# Patient Record
Sex: Female | Born: 1960
Health system: Southern US, Community
[De-identification: ages and names within clinical notes are randomized; demographics above are authoritative.]

## PROBLEM LIST (undated history)

## (undated) DIAGNOSIS — I4891 Unspecified atrial fibrillation: Secondary | ICD-10-CM

## (undated) DIAGNOSIS — M2550 Pain in unspecified joint: Secondary | ICD-10-CM

## (undated) DIAGNOSIS — K219 Gastro-esophageal reflux disease without esophagitis: Secondary | ICD-10-CM

## (undated) DIAGNOSIS — R0600 Dyspnea, unspecified: Secondary | ICD-10-CM

## (undated) DIAGNOSIS — R600 Localized edema: Secondary | ICD-10-CM

## (undated) DIAGNOSIS — M25569 Pain in unspecified knee: Secondary | ICD-10-CM

## (undated) DIAGNOSIS — G4730 Sleep apnea, unspecified: Secondary | ICD-10-CM

## (undated) DIAGNOSIS — I10 Essential (primary) hypertension: Secondary | ICD-10-CM

## (undated) HISTORY — DX: Essential (primary) hypertension: I10

## (undated) HISTORY — DX: Pain in unspecified joint: M25.50

## (undated) HISTORY — DX: Sleep apnea, unspecified: G47.30

## (undated) HISTORY — DX: Gastro-esophageal reflux disease without esophagitis: K21.9

## (undated) HISTORY — DX: Pain in unspecified knee: M25.569

## (undated) HISTORY — DX: Unspecified atrial fibrillation: I48.91

## (undated) HISTORY — DX: Dyspnea, unspecified: R06.00

## (undated) HISTORY — DX: Localized edema: R60.0

---

## 1997-12-13 ENCOUNTER — Other Ambulatory Visit: Admission: RE | Admit: 1997-12-13 | Discharge: 1997-12-13 | Payer: Self-pay | Admitting: *Deleted

## 1999-01-22 ENCOUNTER — Other Ambulatory Visit: Admission: RE | Admit: 1999-01-22 | Discharge: 1999-01-22 | Payer: Self-pay | Admitting: *Deleted

## 1999-10-21 ENCOUNTER — Encounter: Admission: RE | Admit: 1999-10-21 | Discharge: 1999-10-21 | Payer: Self-pay | Admitting: *Deleted

## 1999-10-21 ENCOUNTER — Encounter: Payer: Self-pay | Admitting: *Deleted

## 2000-01-22 ENCOUNTER — Other Ambulatory Visit: Admission: RE | Admit: 2000-01-22 | Discharge: 2000-01-22 | Payer: Self-pay | Admitting: *Deleted

## 2003-03-27 ENCOUNTER — Ambulatory Visit (HOSPITAL_COMMUNITY): Admission: RE | Admit: 2003-03-27 | Discharge: 2003-03-27 | Payer: Self-pay | Admitting: Obstetrics and Gynecology

## 2004-03-31 ENCOUNTER — Ambulatory Visit (HOSPITAL_COMMUNITY): Admission: RE | Admit: 2004-03-31 | Discharge: 2004-03-31 | Payer: Self-pay | Admitting: Obstetrics and Gynecology

## 2005-05-08 ENCOUNTER — Ambulatory Visit (HOSPITAL_COMMUNITY): Admission: RE | Admit: 2005-05-08 | Discharge: 2005-05-08 | Payer: Self-pay | Admitting: Obstetrics and Gynecology

## 2006-07-07 ENCOUNTER — Ambulatory Visit (HOSPITAL_COMMUNITY): Admission: RE | Admit: 2006-07-07 | Discharge: 2006-07-07 | Payer: Self-pay | Admitting: Obstetrics and Gynecology

## 2007-07-22 ENCOUNTER — Ambulatory Visit (HOSPITAL_COMMUNITY): Admission: RE | Admit: 2007-07-22 | Discharge: 2007-07-22 | Payer: Self-pay | Admitting: Obstetrics and Gynecology

## 2007-09-09 ENCOUNTER — Telehealth: Payer: Self-pay | Admitting: Internal Medicine

## 2008-07-24 ENCOUNTER — Ambulatory Visit (HOSPITAL_COMMUNITY): Admission: RE | Admit: 2008-07-24 | Discharge: 2008-07-24 | Payer: Self-pay | Admitting: Obstetrics and Gynecology

## 2008-11-14 ENCOUNTER — Ambulatory Visit (HOSPITAL_COMMUNITY): Admission: RE | Admit: 2008-11-14 | Discharge: 2008-11-14 | Payer: Self-pay | Admitting: Family Medicine

## 2009-08-28 ENCOUNTER — Ambulatory Visit (HOSPITAL_COMMUNITY): Admission: RE | Admit: 2009-08-28 | Discharge: 2009-08-28 | Payer: Self-pay | Admitting: Obstetrics and Gynecology

## 2009-09-05 ENCOUNTER — Encounter: Admission: RE | Admit: 2009-09-05 | Discharge: 2009-09-05 | Payer: Self-pay | Admitting: Obstetrics and Gynecology

## 2010-02-16 ENCOUNTER — Encounter: Payer: Self-pay | Admitting: Obstetrics and Gynecology

## 2010-10-21 ENCOUNTER — Other Ambulatory Visit: Payer: Self-pay | Admitting: Nurse Practitioner

## 2010-10-21 DIAGNOSIS — Z1231 Encounter for screening mammogram for malignant neoplasm of breast: Secondary | ICD-10-CM

## 2010-10-30 ENCOUNTER — Ambulatory Visit
Admission: RE | Admit: 2010-10-30 | Discharge: 2010-10-30 | Disposition: A | Discharge status: Home / Self Care | Payer: BC Managed Care – PPO | Source: Ambulatory Visit | Attending: Nurse Practitioner | Admitting: Nurse Practitioner

## 2010-10-30 DIAGNOSIS — Z1231 Encounter for screening mammogram for malignant neoplasm of breast: Secondary | ICD-10-CM

## 2012-04-12 IMAGING — MG MM DIGITAL SCREENING {BCG}
6 series · 6 of 6 positions shown · non-contrast
Comparison: none

DG SCREEN MAMMOGRAM BILATERAL
Bilateral CC and MLO view(s) were taken.

DIGITAL SCREENING MAMMOGRAM WITH CAD:
The breast tissue is almost entirely fatty.  No masses or malignant type calcifications are 
identified.  Compared with prior studies.
Images were processed with CAD.

[R CC]
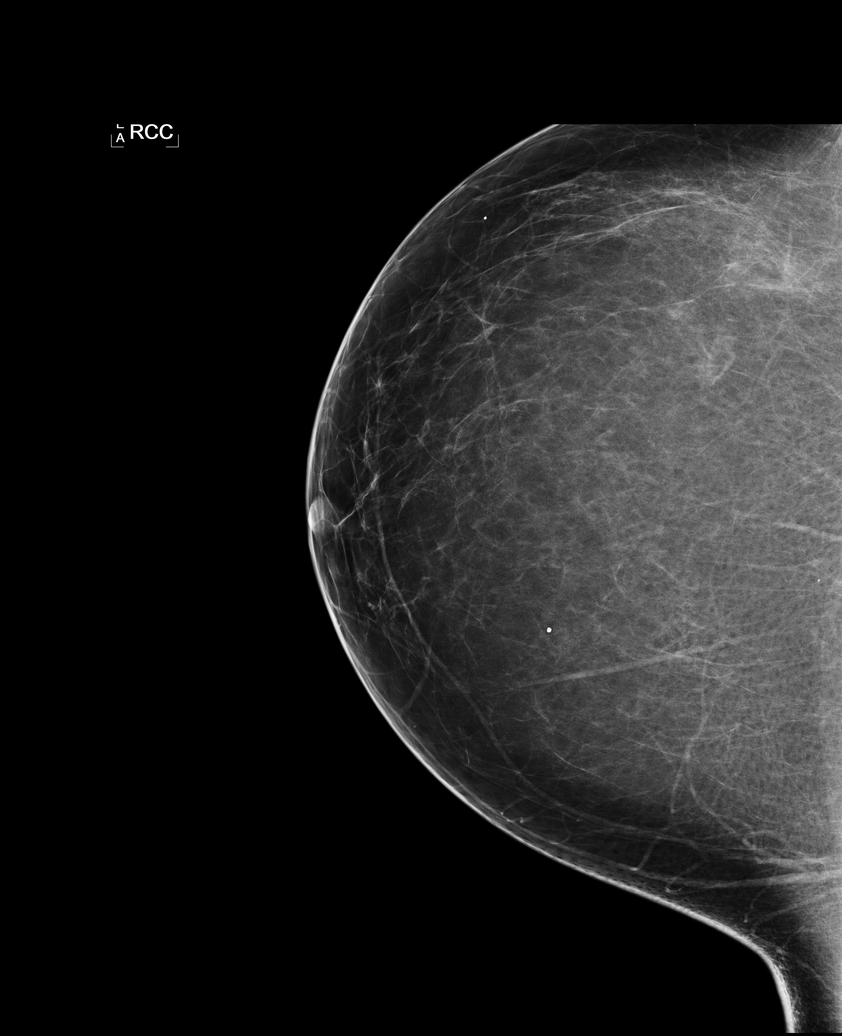

[L CC]
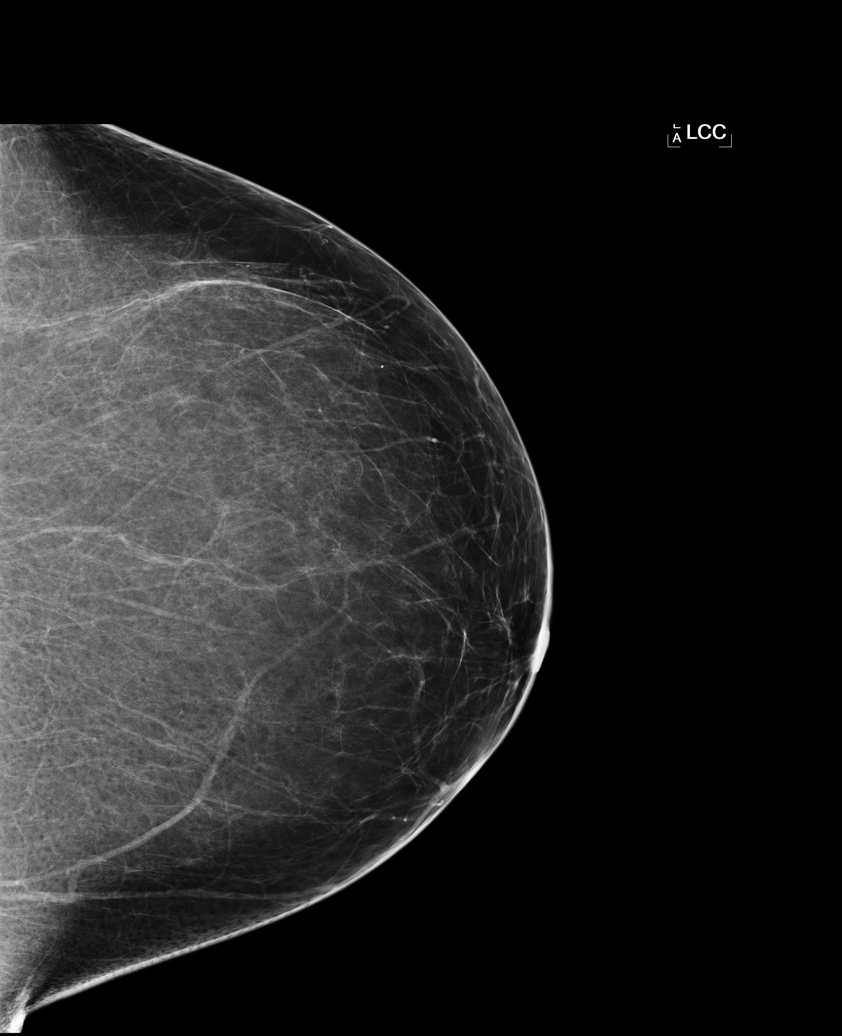

[L MLO (1 of 2)]
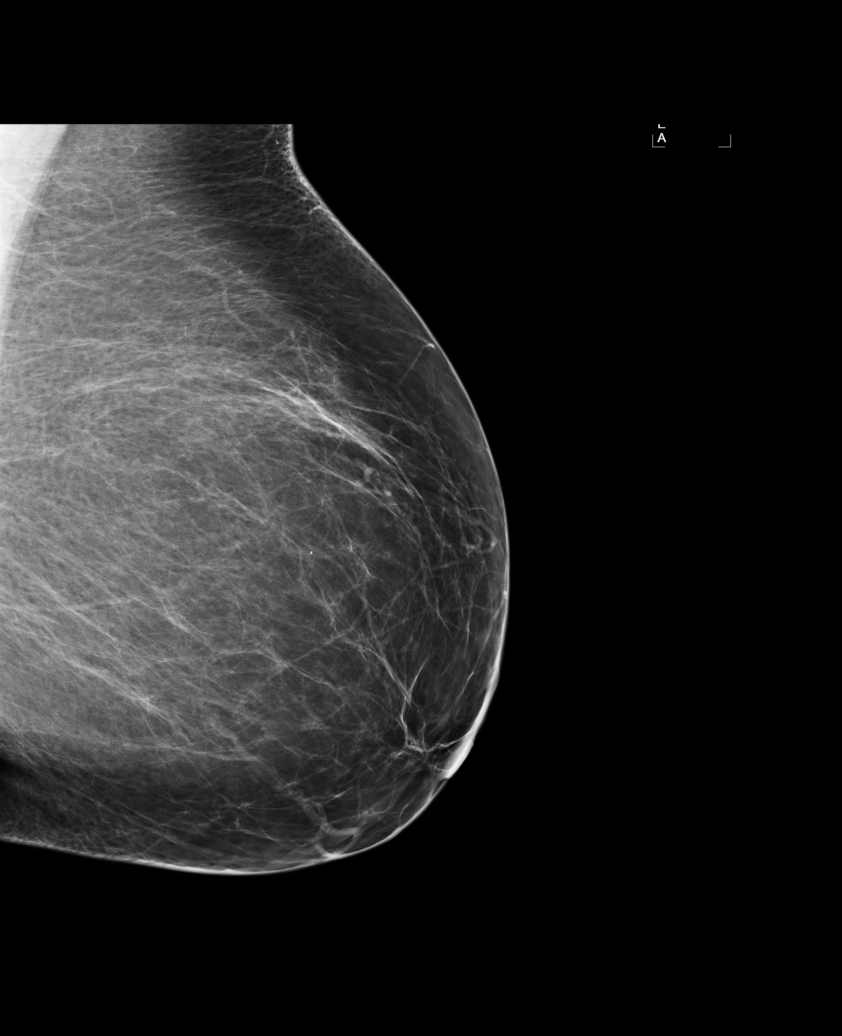

[R MLO (1 of 2)]
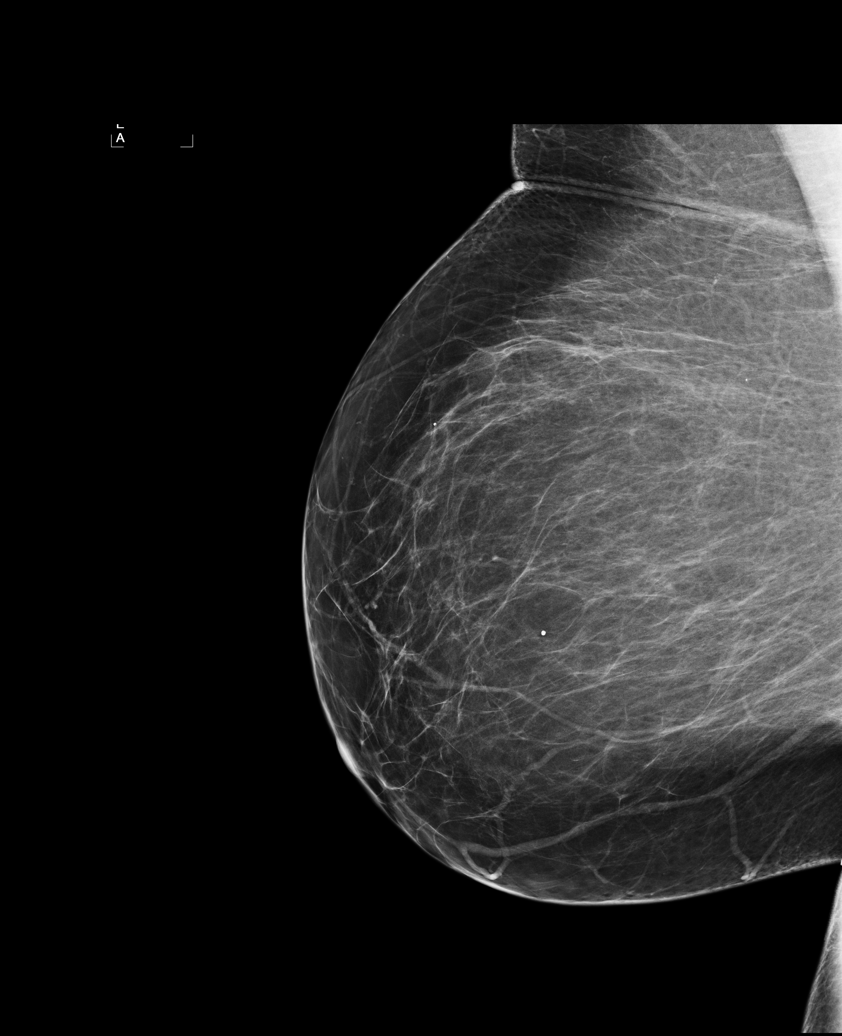

[L MLO (2 of 2)]
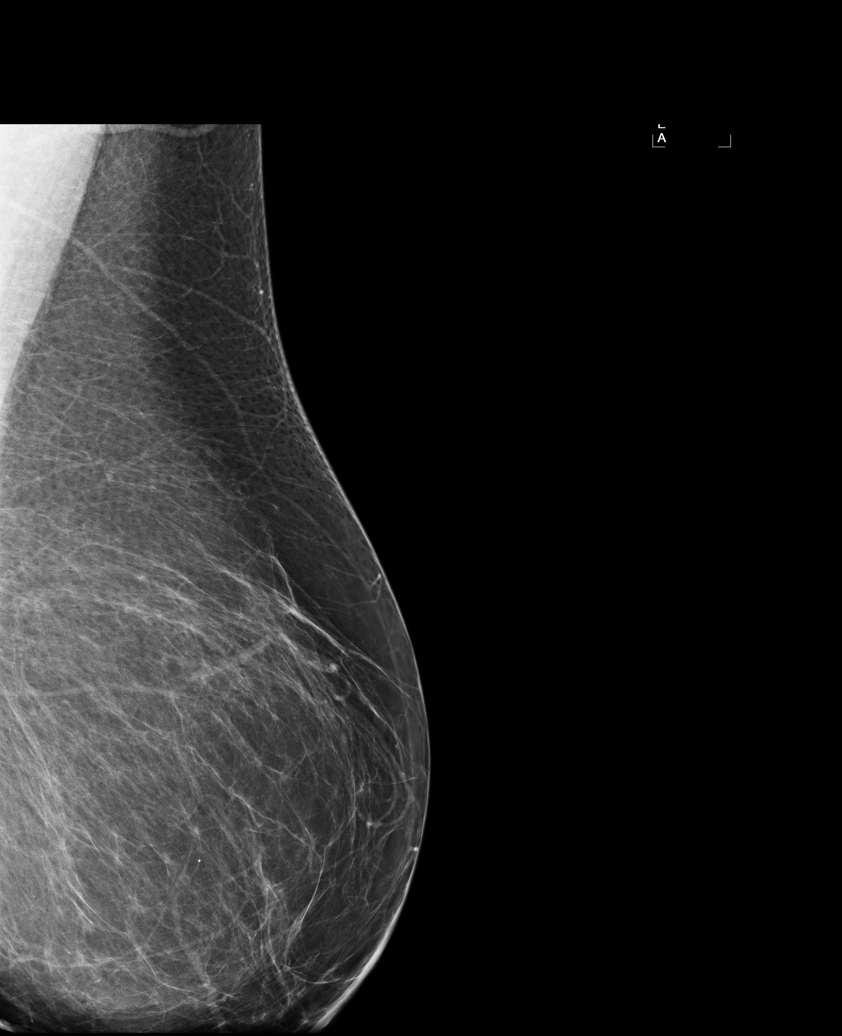

[R MLO (2 of 2)]
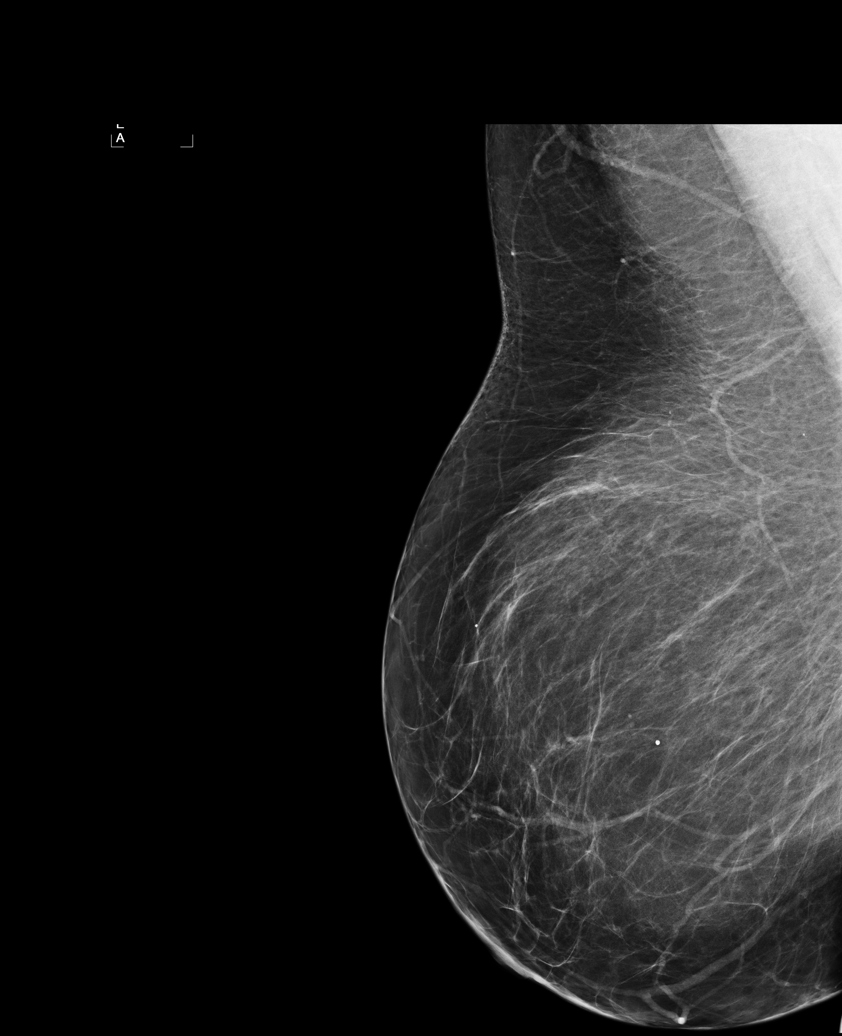

[6 of 6 positions shown; findings below may reference images not displayed]

IMPRESSION: No specific mammographic evidence of malignancy.  Next screening mammogram is recommended in one 
year.

A result letter of this screening mammogram will be mailed directly to the patient.

ASSESSMENT: Negative - BI-RADS 1

Screening mammogram in 1 year.
,

## 2012-08-29 ENCOUNTER — Other Ambulatory Visit: Payer: Self-pay | Admitting: Nurse Practitioner

## 2012-09-06 ENCOUNTER — Ambulatory Visit (INDEPENDENT_AMBULATORY_CARE_PROVIDER_SITE_OTHER): Payer: BC Managed Care – PPO | Admitting: Family Medicine

## 2012-09-06 ENCOUNTER — Encounter: Payer: Self-pay | Admitting: Family Medicine

## 2012-09-06 VITALS — BP 109/69 | HR 69 | Temp 98.4°F | Ht 68.0 in | Wt 261.0 lb

## 2012-09-06 DIAGNOSIS — I10 Essential (primary) hypertension: Secondary | ICD-10-CM

## 2012-09-06 MED ORDER — AMLODIPINE BESY-BENAZEPRIL HCL 5-40 MG PO CAPS: 1.0000 | ORAL_CAPSULE | Freq: Every day | ORAL | Status: DC

## 2012-09-06 MED ORDER — HYDROCHLOROTHIAZIDE 25 MG PO TABS: 25.0000 mg | ORAL_TABLET | Freq: Every day | ORAL | Status: DC

## 2012-09-06 NOTE — Progress Notes (Signed)
Subjective:    Patient here for follow-up of elevated blood pressure.  She is exercising and is not adherent to a low-salt diet.  Blood pressure is well controlled at home. Cardiac symptoms: none. Patient denies: chest pain, chest pressure/discomfort, dyspnea, fatigue and palpitations. Cardiovascular risk factors: obesity (BMI >= 30 kg/m2). Use of agents associated with hypertension: none. History of target organ damage: none.  The following portions of the patient's history were reviewed and updated as appropriate: allergies, current medications, past family history, past medical history, past social history, past surgical history and problem list.  Review of Systems  Pertinent items are noted in HPI.     Objective:    BP 109/69  Pulse 69  Temp(Src) 98.4 F (36.9 C) (Oral)  Ht 5\' 8"  (1.727 m)  Wt 261 lb (118.389 kg)  BMI 39.69 kg/m2  General Appearance:    Alert, cooperative, no distress, appears stated age, obese   Head:    Normocephalic, without obvious abnormality, atraumatic  Eyes:    PERRL, conjunctiva/corneas clear, EOM's intact, fundi    benign, both eyes  Ears:    Normal TM's and external ear canals, both ears  Nose:   Nares normal, septum midline, mucosa normal, no drainage    or sinus tenderness  Throat:   Lips, mucosa, and tongue normal; teeth and gums normal  Neck:   Supple, symmetrical, trachea midline, no adenopathy;    thyroid:  no enlargement/tenderness/nodules; no carotid   bruit or JVD  Back:     Symmetric, no curvature, ROM normal, no CVA tenderness  Lungs:     Clear to auscultation bilaterally, respirations unlabored  Chest Wall:    No tenderness or deformity   Heart:    Regular rate and rhythm, S1 and S2 normal, no murmur, rub   or gallop     Abdomen:     Soft, non-tender, bowel sounds active all four quadrants,    no masses, no organomegaly        Extremities:   Extremities normal, atraumatic, no cyanosis or edema  Pulses:   2+ and symmetric all  extremities  Skin:   Skin color, texture, turgor normal, no rashes or lesions  Lymph nodes:   Cervical, supraclavicular, and axillary nodes normal  Neurologic:   CNII-XII intact, normal strength, sensation and reflexes    throughout      Assessment:    Hypertension, normal blood pressure well controlled on current regimen. Evidence of target organ damage: none.    Plan:    Continue current regimen. Pt refused risk factor labs. Follow up in 6 months for BP recheck. Check Labs at that time.

## 2012-09-09 ENCOUNTER — Other Ambulatory Visit: Payer: Self-pay

## 2012-09-09 DIAGNOSIS — I10 Essential (primary) hypertension: Secondary | ICD-10-CM

## 2012-09-09 MED ORDER — HYDROCHLOROTHIAZIDE 25 MG PO TABS: 25.0000 mg | ORAL_TABLET | Freq: Every day | ORAL | Status: DC

## 2012-09-09 MED ORDER — AMLODIPINE BESY-BENAZEPRIL HCL 5-40 MG PO CAPS: 1.0000 | ORAL_CAPSULE | Freq: Every day | ORAL | Status: DC

## 2013-03-02 ENCOUNTER — Other Ambulatory Visit: Payer: Self-pay | Admitting: Family Medicine

## 2013-06-01 ENCOUNTER — Other Ambulatory Visit: Payer: Self-pay | Admitting: Family Medicine

## 2015-04-26 DIAGNOSIS — Z3042 Encounter for surveillance of injectable contraceptive: Secondary | ICD-10-CM | POA: Diagnosis not present

## 2015-07-16 DIAGNOSIS — Z3042 Encounter for surveillance of injectable contraceptive: Secondary | ICD-10-CM | POA: Diagnosis not present

## 2015-10-08 DIAGNOSIS — Z3042 Encounter for surveillance of injectable contraceptive: Secondary | ICD-10-CM | POA: Diagnosis not present

## 2015-10-08 DIAGNOSIS — Z113 Encounter for screening for infections with a predominantly sexual mode of transmission: Secondary | ICD-10-CM | POA: Diagnosis not present

## 2015-10-08 DIAGNOSIS — Z114 Encounter for screening for human immunodeficiency virus [HIV]: Secondary | ICD-10-CM | POA: Diagnosis not present

## 2015-10-08 DIAGNOSIS — Z1159 Encounter for screening for other viral diseases: Secondary | ICD-10-CM | POA: Diagnosis not present

## 2015-10-11 ENCOUNTER — Telehealth: Payer: Self-pay | Admitting: Family Medicine

## 2016-01-28 DIAGNOSIS — T161XXA Foreign body in right ear, initial encounter: Secondary | ICD-10-CM | POA: Diagnosis not present

## 2016-01-28 DIAGNOSIS — H93291 Other abnormal auditory perceptions, right ear: Secondary | ICD-10-CM | POA: Diagnosis not present

## 2016-02-07 DIAGNOSIS — Z6841 Body Mass Index (BMI) 40.0 and over, adult: Secondary | ICD-10-CM | POA: Diagnosis not present

## 2016-02-07 DIAGNOSIS — Z01419 Encounter for gynecological examination (general) (routine) without abnormal findings: Secondary | ICD-10-CM | POA: Diagnosis not present

## 2016-02-07 DIAGNOSIS — Z1231 Encounter for screening mammogram for malignant neoplasm of breast: Secondary | ICD-10-CM | POA: Diagnosis not present

## 2016-02-28 MED FILL — PHENTERMINE 37.5 MG TABLET: 37.5 | 30 days supply | Qty: 30 | Fill #0

## 2016-02-28 MED FILL — AMLODIPINE-BENAZEPRIL 5-40: 5-40 | 30 days supply | Qty: 30 | Fill #0

## 2016-03-03 ENCOUNTER — Ambulatory Visit: Payer: Self-pay | Admitting: Adult Health

## 2016-03-25 ENCOUNTER — Ambulatory Visit: Payer: Self-pay | Admitting: Adult Health

## 2016-04-09 ENCOUNTER — Ambulatory Visit: Payer: Self-pay | Admitting: Adult Health

## 2016-04-17 MED FILL — PHENTERMINE 37.5 MG TABLET: 37.5 | 30 days supply | Qty: 30 | Fill #1

## 2016-04-17 MED FILL — AMLODIPINE-BENAZEPRIL 5-40: 5-40 | 30 days supply | Qty: 30 | Fill #1

## 2016-04-30 ENCOUNTER — Encounter: Payer: Self-pay | Admitting: Adult Health

## 2016-04-30 ENCOUNTER — Ambulatory Visit (INDEPENDENT_AMBULATORY_CARE_PROVIDER_SITE_OTHER): Payer: 59 | Admitting: Adult Health

## 2016-04-30 VITALS — BP 147/83 | HR 76 | Ht 68.0 in | Wt 265.9 lb

## 2016-04-30 DIAGNOSIS — Z13220 Encounter for screening for lipoid disorders: Secondary | ICD-10-CM | POA: Diagnosis not present

## 2016-04-30 DIAGNOSIS — E669 Obesity, unspecified: Secondary | ICD-10-CM

## 2016-04-30 DIAGNOSIS — M79602 Pain in left arm: Secondary | ICD-10-CM | POA: Diagnosis not present

## 2016-04-30 DIAGNOSIS — Z833 Family history of diabetes mellitus: Secondary | ICD-10-CM | POA: Diagnosis not present

## 2016-04-30 DIAGNOSIS — I10 Essential (primary) hypertension: Secondary | ICD-10-CM | POA: Insufficient documentation

## 2016-04-30 DIAGNOSIS — R5383 Other fatigue: Secondary | ICD-10-CM | POA: Diagnosis not present

## 2016-04-30 DIAGNOSIS — Z1211 Encounter for screening for malignant neoplasm of colon: Secondary | ICD-10-CM | POA: Insufficient documentation

## 2016-04-30 DIAGNOSIS — Z0000 Encounter for general adult medical examination without abnormal findings: Secondary | ICD-10-CM | POA: Insufficient documentation

## 2016-04-30 DIAGNOSIS — Z6841 Body Mass Index (BMI) 40.0 and over, adult: Secondary | ICD-10-CM | POA: Insufficient documentation

## 2016-04-30 DIAGNOSIS — M79601 Pain in right arm: Secondary | ICD-10-CM | POA: Diagnosis not present

## 2016-04-30 NOTE — Patient Instructions (Signed)
Hypertension Hypertension, commonly called high blood pressure, is when the force of blood pumping through the arteries is too strong. The arteries are the blood vessels that carry blood from the heart throughout the body. Hypertension forces the heart to work harder to pump blood and may cause arteries to become narrow or stiff. Having untreated or uncontrolled hypertension can cause heart attacks, strokes, kidney disease, and other problems. A blood pressure reading consists of a higher number over a lower number. Ideally, your blood pressure should be below 120/80. The first ("top") number is called the systolic pressure. It is a measure of the pressure in your arteries as your heart beats. The second ("bottom") number is called the diastolic pressure. It is a measure of the pressure in your arteries as the heart relaxes. What are the causes? The cause of this condition is not known. What increases the risk? Some risk factors for high blood pressure are under your control. Others are not. Factors you can change   Smoking.  Having type 2 diabetes mellitus, high cholesterol, or both.  Not getting enough exercise or physical activity.  Being overweight.  Having too much fat, sugar, calories, or salt (sodium) in your diet.  Drinking too much alcohol. Factors that are difficult or impossible to change   Having chronic kidney disease.  Having a family history of high blood pressure.  Age. Risk increases with age.  Race. You may be at higher risk if you are African-American.  Gender. Men are at higher risk than women before age 45. After age 65, women are at higher risk than men.  Having obstructive sleep apnea.  Stress. What are the signs or symptoms? Extremely high blood pressure (hypertensive crisis) may cause:  Headache.  Anxiety.  Shortness of breath.  Nosebleed.  Nausea and vomiting.  Severe chest pain.  Jerky movements you cannot control (seizures). How is this  diagnosed? This condition is diagnosed by measuring your blood pressure while you are seated, with your arm resting on a surface. The cuff of the blood pressure monitor will be placed directly against the skin of your upper arm at the level of your heart. It should be measured at least twice using the same arm. Certain conditions can cause a difference in blood pressure between your right and left arms. Certain factors can cause blood pressure readings to be lower or higher than normal (elevated) for a short period of time:  When your blood pressure is higher when you are in a health care provider's office than when you are at home, this is called white coat hypertension. Most people with this condition do not need medicines.  When your blood pressure is higher at home than when you are in a health care provider's office, this is called masked hypertension. Most people with this condition may need medicines to control blood pressure. If you have a high blood pressure reading during one visit or you have normal blood pressure with other risk factors:  You may be asked to return on a different day to have your blood pressure checked again.  You may be asked to monitor your blood pressure at home for 1 week or longer. If you are diagnosed with hypertension, you may have other blood or imaging tests to help your health care provider understand your overall risk for other conditions. How is this treated? This condition is treated by making healthy lifestyle changes, such as eating healthy foods, exercising more, and reducing your alcohol intake. Your health   care provider may prescribe medicine if lifestyle changes are not enough to get your blood pressure under control, and if:  Your systolic blood pressure is above 130.  Your diastolic blood pressure is above 80. Your personal target blood pressure may vary depending on your medical conditions, your age, and other factors. Follow these instructions  at home: Eating and drinking   Eat a diet that is high in fiber and potassium, and low in sodium, added sugar, and fat. An example eating plan is called the DASH (Dietary Approaches to Stop Hypertension) diet. To eat this way:  Eat plenty of fresh fruits and vegetables. Try to fill half of your plate at each meal with fruits and vegetables.  Eat whole grains, such as whole wheat pasta, brown rice, or whole grain bread. Fill about one quarter of your plate with whole grains.  Eat or drink low-fat dairy products, such as skim milk or low-fat yogurt.  Avoid fatty cuts of meat, processed or cured meats, and poultry with skin. Fill about one quarter of your plate with lean proteins, such as fish, chicken without skin, beans, eggs, and tofu.  Avoid premade and processed foods. These tend to be higher in sodium, added sugar, and fat.  Reduce your daily sodium intake. Most people with hypertension should eat less than 1,500 mg of sodium a day.  Limit alcohol intake to no more than 1 drink a day for nonpregnant women and 2 drinks a day for men. One drink equals 12 oz of beer, 5 oz of wine, or 1 oz of hard liquor. Lifestyle   Work with your health care provider to maintain a healthy body weight or to lose weight. Ask what an ideal weight is for you.  Get at least 30 minutes of exercise that causes your heart to beat faster (aerobic exercise) most days of the week. Activities may include walking, swimming, or biking.  Include exercise to strengthen your muscles (resistance exercise), such as pilates or lifting weights, as part of your weekly exercise routine. Try to do these types of exercises for 30 minutes at least 3 days a week.  Do not use any products that contain nicotine or tobacco, such as cigarettes and e-cigarettes. If you need help quitting, ask your health care provider.  Monitor your blood pressure at home as told by your health care provider.  Keep all follow-up visits as told by  your health care provider. This is important. Medicines   Take over-the-counter and prescription medicines only as told by your health care provider. Follow directions carefully. Blood pressure medicines must be taken as prescribed.  Do not skip doses of blood pressure medicine. Doing this puts you at risk for problems and can make the medicine less effective.  Ask your health care provider about side effects or reactions to medicines that you should watch for. Contact a health care provider if:  You think you are having a reaction to a medicine you are taking.  You have headaches that keep coming back (recurring).  You feel dizzy.  You have swelling in your ankles.  You have trouble with your vision. Get help right away if:  You develop a severe headache or confusion.  You have unusual weakness or numbness.  You feel faint.  You have severe pain in your chest or abdomen.  You vomit repeatedly.  You have trouble breathing. Summary  Hypertension is when the force of blood pumping through your arteries is too strong. If this condition is   not controlled, it may put you at risk for serious complications.  Your personal target blood pressure may vary depending on your medical conditions, your age, and other factors. For most people, a normal blood pressure is less than 120/80.  Hypertension is treated with lifestyle changes, medicines, or a combination of both. Lifestyle changes include weight loss, eating a healthy, low-sodium diet, exercising more, and limiting alcohol. This information is not intended to replace advice given to you by your health care provider. Make sure you discuss any questions you have with your health care provider. Document Released: 01/12/2005 Document Revised: 12/11/2015 Document Reviewed: 12/11/2015 Elsevier Interactive Patient Education  2017 [place]. Heart-Healthy Eating Plan Many factors influence your heart health, including eating and  exercise habits. Heart (coronary) risk increases with abnormal blood fat (lipid) levels. Heart-healthy meal planning includes limiting unhealthy fats, increasing healthy fats, and making other small dietary changes. This includes maintaining a healthy body weight to help keep lipid levels within a normal range. What is my plan? Your health care provider recommends that you:  Get no more than _________% of the total calories in your daily diet from fat.  Limit your intake of saturated fat to less than _________% of your total calories each day.  Limit the amount of cholesterol in your diet to less than _________ mg per day. What types of fat should I choose?  Choose healthy fats more often. Choose monounsaturated and polyunsaturated fats, such as olive oil and canola oil, flaxseeds, walnuts, almonds, and seeds.  Eat more omega-3 fats. Good choices include salmon, mackerel, sardines, tuna, flaxseed oil, and ground flaxseeds. Aim to eat fish at least two times each week.  Limit saturated fats. Saturated fats are primarily found in animal products, such as meats, butter, and cream. Plant sources of saturated fats include palm oil, palm kernel oil, and coconut oil.  Avoid foods with partially hydrogenated oils in them. These contain trans fats. Examples of foods that contain trans fats are stick margarine, some tub margarines, cookies, crackers, and other baked goods. What general guidelines do I need to follow?  Check food labels carefully to identify foods with trans fats or high amounts of saturated fat.  Fill one half of your plate with vegetables and green salads. Eat 4-5 servings of vegetables per day. A serving of vegetables equals 1 cup of raw leafy vegetables,  cup of raw or cooked cut-up vegetables, or  cup of vegetable juice.  Fill one fourth of your plate with whole grains. Look for the word "whole" as the first word in the ingredient list.  Fill one fourth of your plate with lean  protein foods.  Eat 4-5 servings of fruit per day. A serving of fruit equals one medium whole fruit,  cup of dried fruit,  cup of fresh, frozen, or canned fruit, or  cup of 100% fruit juice.  Eat more foods that contain soluble fiber. Examples of foods that contain this type of fiber are apples, broccoli, carrots, beans, peas, and barley. Aim to get 20-30 g of fiber per day.  Eat more home-cooked food and less restaurant, buffet, and fast food.  Limit or avoid alcohol.  Limit foods that are high in starch and sugar.  Avoid fried foods.  Cook foods by using methods other than frying. Baking, boiling, grilling, and broiling are all great options. Other fat-reducing suggestions include:  Removing the skin from poultry.  Removing all visible fats from meats.  Skimming the fat off of stews,  soups, and gravies before serving them.  Steaming vegetables in water or broth.  Lose weight if you are overweight. Losing just 5-10% of your initial body weight can help your overall health and prevent diseases such as diabetes and heart disease.  Increase your consumption of nuts, legumes, and seeds to 4-5 servings per week. One serving of dried beans or legumes equals  cup after being cooked, one serving of nuts equals 1 ounces, and one serving of seeds equals  ounce or 1 tablespoon.  You may need to monitor your salt (sodium) intake, especially if you have high blood pressure. Talk with your health care provider or dietitian to get more information about reducing sodium. What foods can I eat? Grains   Breads, including [place], white, pita, wheat, raisin, rye, oatmeal, and [place]. Tortillas that are neither fried nor made with lard or trans fat. Low-fat rolls, including hotdog and hamburger buns and English muffins. Biscuits. Muffins. Waffles. Pancakes. Light popcorn. Whole-grain cereals. Flatbread. Melba toast. Pretzels. Breadsticks. Rusks. Low-fat snacks and crackers, including oyster,  saltine, matzo, graham, animal, and rye. Rice and pasta, including brown rice and those that are made with whole wheat. Vegetables  All vegetables. Fruits  All fruits, but limit coconut. Meats and Other Protein Sources  Lean, well-trimmed beef, veal, pork, and lamb. Chicken and [place] without skin. All fish and shellfish. Wild duck, rabbit, pheasant, and venison. Egg whites or low-cholesterol egg substitutes. Dried beans, peas, lentils, and tofu.Seeds and most nuts. Dairy  Low-fat or nonfat cheeses, including ricotta, string, and mozzarella. Skim or 1% milk that is liquid, powdered, or evaporated. Buttermilk that is made with low-fat milk. Nonfat or low-fat yogurt. Beverages  Mineral water. Diet carbonated beverages. Sweets and Desserts  Sherbets and fruit ices. Honey, jam, marmalade, jelly, and syrups. Meringues and gelatins. Pure sugar candy, such as hard candy, jelly beans, gumdrops, mints, marshmallows, and small amounts of dark chocolate. [employer] Eat all sweets and desserts in moderation. Fats and Oils  Nonhydrogenated (trans-free) margarines. Vegetable oils, including soybean, sesame, sunflower, olive, peanut, safflower, corn, canola, and cottonseed. Salad dressings or mayonnaise that are made with a vegetable oil. Limit added fats and oils that you use for cooking, baking, salads, and as spreads. Other  Cocoa powder. Coffee and tea. All seasonings and condiments. The items listed above may not be a complete list of recommended foods or beverages. Contact your dietitian for more options.  What foods are not recommended? Grains  Breads that are made with saturated or trans fats, oils, or whole milk. Croissants. Butter rolls. Cheese breads. Sweet rolls. Donuts. Buttered popcorn. Chow mein noodles. High-fat crackers, such as cheese or butter crackers. Meats and Other Protein Sources  Fatty meats, such as hotdogs, short ribs, sausage, spareribs, bacon, ribeye roast or steak, and  mutton. High-fat deli meats, such as salami and bologna. Caviar. Domestic duck and goose. Organ meats, such as kidney, liver, sweetbreads, brains, gizzard, chitterlings, and heart. Dairy  Cream, sour cream, cream cheese, and creamed cottage cheese. Whole milk cheeses, including blue (bleu), Monte[place] Jack, [place], [place], American, [place]met, Swiss, [place], [place], and [place]. Whole or 2% milk that is liquid, evaporated, or condensed. Whole buttermilk. Cream sauce or high-fat cheese sauce. Yogurt that is made from whole milk. Beverages  Regular sodas and drinks with added sugar. Sweets and Desserts  Frosting. Pudding. Cookies. Cakes other than angel food cake. Candy that has milk chocolate or white chocolate, hydrogenated fat, butter, coconut, or unknown ingredients. Buttered syrups. Full-fat  ice cream or ice cream drinks. Fats and Oils  Gravy that has suet, meat fat, or shortening. Cocoa butter, hydrogenated oils, palm oil, coconut oil, palm kernel oil. These can often be found in baked products, candy, fried foods, nondairy creamers, and whipped toppings. Solid fats and shortenings, including bacon fat, salt pork, lard, and butter. Nondairy cream substitutes, such as coffee creamers and sour cream substitutes. Salad dressings that are made of unknown oils, cheese, or sour cream. The items listed above may not be a complete list of foods and beverages to avoid. Contact your dietitian for more information.  This information is not intended to replace advice given to you by your health care provider. Make sure you discuss any questions you have with your health care provider. Document Released: 10/22/2007 Document Revised: 08/02/2015 Document Reviewed: 07/06/2013 Elsevier Interactive Patient Education  2017 [employer].  Please continue all medications as directed. Increase water intake to at least 120 ounces/daily. Continue to increase daily movement, recommend at least 63mins/5 times week. Please  schedule fasting lab nurse visit at your convenience. Annual follow-up, sooner if needed.

## 2016-04-30 NOTE — Assessment & Plan Note (Signed)
  Please continue all medications as directed. Increase water intake to at least 120 ounces/daily. Continue to increase daily movement, recommend at least 9mins/5 times week. Please schedule fasting lab nurse visit at your convenience. Annual follow-up, sooner if needed.

## 2016-04-30 NOTE — Assessment & Plan Note (Signed)
Stretch daily-YouTube, Pinterest videos. [place] Salt baths. OTC Ibuprofen PRN.

## 2016-04-30 NOTE — Progress Notes (Signed)
Subjective:    Patient ID: Tonya Nicholson, female    DOB: [birth year], 56 y.o.   MRN: [id number]  HPI:  Tonya Nicholson is here to establish as a new pt.  She is a very pleasant 56 year old woman.  PMH:  HTN, obesity, and bil upper arm pain that develops after strenuous yard work/exercise, rated at 4/10 when it hurts and describes pain as throbbing/soreness.  She is compliant with medications and denies SE.  She has been taking phentermine sporadically over the last few months and reports gaining back the 10 lbs she had lost.  Her and her daughter are planning on starting a new, regular exercise routine (nightly walking, using local [employer] 2-3 times week). Her diet is well balanced and she has been eliminating sugar/saturated fat/CHOs.  She denies tobacco use and reports social consumption of ETOH.    Patient Care Team    Relationship Specialty Notifications Start End  Odella Aquas, NP PCP - General Family Medicine  04/30/16     Patient Active Problem List   Diagnosis Date Noted  . Health care maintenance 04/30/2016  . Family history of diabetes mellitus 04/30/2016  . Screening for hyperlipidemia 04/30/2016  . Other fatigue 04/30/2016     Past Medical History:  Diagnosis Date  . Hypertension      History reviewed. No pertinent surgical history.   Family History  Problem Relation Age of Onset  . Diabetes Mother   . Hypertension Mother   . Diabetes Father   . Cancer Father     bladders  . Hypertension Father   . Healthy Sister   . Diabetes Brother      History  Drug Use No     History  Alcohol Use  . 5.4 oz/week  . 7 Standard drinks or equivalent, 2 Shots of liquor per week     History  Smoking Status  . Never Smoker  Smokeless Tobacco  . Never Used     Outpatient Encounter Prescriptions as of 04/30/2016  Medication Sig  . amLODipine-benazepril (LOTREL) 5-40 MG per capsule TAKE 1 CAPSULE BY MOUTH DAILY.  . hydrochlorothiazide (HYDRODIURIL) 25 MG tablet Take 1  tablet (25 mg total) by mouth daily.  . phentermine 37.5 MG capsule Take 1 capsule by mouth daily.  . valACYclovir (VALTREX) 500 MG tablet Take 1 tablet by mouth daily.   No facility-administered encounter medications on file as of 04/30/2016.     Allergies: Patient has no known allergies.  Body mass index is 40.43 kg/m.  Blood pressure (!) 147/83, pulse 76, height 5\' 8"  (1.727 m), weight 265 lb 14.4 oz (120.6 kg), last menstrual period 04/09/2016.  Review of Systems  Constitutional: Negative for activity change, appetite change, chills, diaphoresis, fatigue, fever and unexpected weight change.  HENT: Negative for congestion.   Eyes: Negative for visual disturbance.  Respiratory: Negative for cough, chest tightness, shortness of breath, wheezing and stridor.   Cardiovascular: Positive for leg swelling. Negative for chest pain and palpitations.  Gastrointestinal: Negative for abdominal distention, abdominal pain, blood in stool, constipation, diarrhea, nausea and vomiting.  Endocrine: Negative for cold intolerance, heat intolerance, polydipsia, polyphagia and polyuria.  Genitourinary: Negative for difficulty urinating and flank pain.  Musculoskeletal: Positive for arthralgias and myalgias. Negative for back pain, gait problem, joint swelling, neck pain and neck stiffness.  Skin: Negative for color change, pallor, rash and wound.       Objective:   Physical Exam  Constitutional: She is oriented to  person, place, and time. She appears well-developed and well-nourished. No distress.  HENT:  Head: Normocephalic and atraumatic.  Right Ear: Hearing, tympanic membrane, external ear and ear canal normal. No decreased hearing is noted.  Left Ear: Hearing, tympanic membrane, external ear and ear canal normal. No decreased hearing is noted.  Nose: Rhinorrhea present. No sinus tenderness. Right sinus exhibits no maxillary sinus tenderness and no frontal sinus tenderness. Left sinus exhibits no  maxillary sinus tenderness and no frontal sinus tenderness.  Mouth/Throat: Uvula is midline.  Eyes: Conjunctivae are normal. Pupils are equal, round, and reactive to light.  Neck: Normal range of motion. Neck supple.  Cardiovascular: Normal rate, regular rhythm, normal heart sounds and intact distal pulses.   No murmur heard. Pulmonary/Chest: Effort normal and breath sounds normal. No respiratory distress. She has no wheezes. She has no rales. She exhibits no tenderness.  Abdominal: Soft. Bowel sounds are normal. She exhibits no distension and no mass. There is no tenderness. There is no rebound and no guarding.  Musculoskeletal: Normal range of motion. She exhibits no edema, tenderness or deformity.       Right shoulder: She exhibits normal range of motion, no tenderness, no swelling, no crepitus, normal pulse and normal strength.       Left shoulder: She exhibits crepitus. She exhibits normal range of motion, no tenderness, no swelling, normal pulse and normal strength.  Lymphadenopathy:    She has no cervical adenopathy.  Neurological: She is alert and oriented to person, place, and time. Coordination normal.  Skin: Skin is warm and dry. No rash noted. She is not diaphoretic. No erythema. No pallor.  Psychiatric: She has a normal mood and affect. Her behavior is normal. Judgment and thought content normal.  Nursing note and vitals reviewed.         Assessment & Plan:   1. Other fatigue   2. Family history of diabetes mellitus   3. Screening for hyperlipidemia   4. Health care maintenance   5. Essential hypertension   6. Obesity due to energy imbalance   7. Bilateral arm pain     Health care maintenance  Please continue all medications as directed. Increase water intake to at least 120 ounces/daily. Continue to increase daily movement, recommend at least 55mins/5 times week. Please schedule fasting lab nurse visit at your convenience. Annual follow-up, sooner if  needed.  Family history of diabetes mellitus Will check A1c next week.  Screening for hyperlipidemia Will check lipid panel next week.  Other fatigue Will check Vit d level next week.  Essential hypertension Continue amlodipine/benazepril 5/40 daily. Continue HCTZ 25 mg daily.   Obesity due to energy imbalance Continue to eat a heart healthy diet. Increase regular movement to at least 50mins/5 times week. Resume use of [employer]. Continue yard work. Goal clothing size is 14/16.  Bilateral arm pain Stretch daily-YouTube, Pinterest videos. Epson Salt baths. OTC Ibuprofen PRN.    FOLLOW-UP:  Return in about 1 year (around 04/30/2017) for Regular Follow Up, HTN.

## 2016-04-30 NOTE — Assessment & Plan Note (Signed)
Will check A1c next week.

## 2016-04-30 NOTE — Assessment & Plan Note (Signed)
Continue amlodipine/benazepril 5/40 daily. Continue HCTZ 25 mg daily.

## 2016-04-30 NOTE — Assessment & Plan Note (Signed)
Will check lipid panel next week.

## 2016-04-30 NOTE — Assessment & Plan Note (Signed)
Continue to eat a heart healthy diet. Increase regular movement to at least 53mins/5 times week. Resume use of [employer]. Continue yard work. Goal clothing size is 14/16.

## 2016-04-30 NOTE — Assessment & Plan Note (Signed)
Will check Vit d level next week.

## 2016-05-08 ENCOUNTER — Other Ambulatory Visit (INDEPENDENT_AMBULATORY_CARE_PROVIDER_SITE_OTHER): Payer: 59

## 2016-05-08 DIAGNOSIS — Z13220 Encounter for screening for lipoid disorders: Secondary | ICD-10-CM | POA: Diagnosis not present

## 2016-05-08 DIAGNOSIS — R5383 Other fatigue: Secondary | ICD-10-CM

## 2016-05-08 DIAGNOSIS — Z833 Family history of diabetes mellitus: Secondary | ICD-10-CM | POA: Diagnosis not present

## 2016-05-09 LAB — LIPID PANEL
CHOL/HDL RATIO: 2.8 ratio (ref 0.0–4.4)
CHOLESTEROL TOTAL: 144 mg/dL (ref 100–199)
HDL: 51 mg/dL (ref >39)
LDL CALC: 77 mg/dL (ref 0–99)
TRIGLYCERIDES: 78 mg/dL (ref 0–149)
VLDL CHOLESTEROL CAL: 16 mg/dL (ref 5–40)

## 2016-05-09 LAB — CBC WITH DIFFERENTIAL/PLATELET
BASOS: 1 %
Basophils Absolute: 0.1 10*3/uL (ref 0.0–0.2)
EOS (ABSOLUTE): 0.2 10*3/uL (ref 0.0–0.4)
EOS: 2 %
HEMATOCRIT: 43.3 % (ref 34.0–46.6)
HEMOGLOBIN: 14.2 g/dL (ref 11.1–15.9)
IMMATURE GRANS (ABS): 0.1 10*3/uL (ref 0.0–0.1)
IMMATURE GRANULOCYTES: 1 %
LYMPHS: 23 %
Lymphocytes Absolute: 1.7 10*3/uL (ref 0.7–3.1)
MCH: 29 pg (ref 26.6–33.0)
MCHC: 32.8 g/dL (ref 31.5–35.7)
MCV: 88 fL (ref 79–97)
MONOCYTES: 8 %
Monocytes Absolute: 0.6 10*3/uL (ref 0.1–0.9)
NEUTROS ABS: 4.8 10*3/uL (ref 1.4–7.0)
NEUTROS PCT: 65 %
PLATELETS: 273 10*3/uL (ref 150–379)
RBC: 4.9 x10E6/uL (ref 3.77–5.28)
RDW: 13.2 % (ref 12.3–15.4)
WBC: 7.5 10*3/uL (ref 3.4–10.8)

## 2016-05-09 LAB — COMPREHENSIVE METABOLIC PANEL
A/G RATIO: 1.6 (ref 1.2–2.2)
ALBUMIN: 3.9 g/dL (ref 3.5–5.5)
ALT: 20 IU/L (ref 0–32)
AST: 16 IU/L (ref 0–40)
Alkaline Phosphatase: 102 IU/L (ref 39–117)
BILIRUBIN TOTAL: 0.5 mg/dL (ref 0.0–1.2)
BUN / CREAT RATIO: 16 (ref 9–23)
BUN: 13 mg/dL (ref 6–24)
CALCIUM: 8.8 mg/dL (ref 8.7–10.2)
CHLORIDE: 103 mmol/L (ref 96–106)
CO2: 25 mmol/L (ref 18–29)
Creatinine, Ser: 0.8 mg/dL (ref 0.57–1.00)
GFR, EST AFRICAN AMERICAN: 95 mL/min/{1.73_m2} (ref >59)
GFR, EST NON AFRICAN AMERICAN: 83 mL/min/{1.73_m2} (ref >59)
Globulin, Total: 2.5 g/dL (ref 1.5–4.5)
Glucose: 151 mg/dL — ABNORMAL HIGH (ref 65–99)
POTASSIUM: 4.5 mmol/L (ref 3.5–5.2)
Sodium: 140 mmol/L (ref 134–144)
TOTAL PROTEIN: 6.4 g/dL (ref 6.0–8.5)

## 2016-05-09 LAB — HEMOGLOBIN A1C
Est. average glucose Bld gHb Est-mCnc: 137 mg/dL
Hgb A1c MFr Bld: 6.4 % — ABNORMAL HIGH (ref 4.8–5.6)

## 2016-05-09 LAB — VITAMIN D 25 HYDROXY (VIT D DEFICIENCY, FRACTURES): Vit D, 25-Hydroxy: 35.6 ng/mL (ref 30.0–100.0)

## 2016-05-09 LAB — TSH: TSH: 1.02 u[IU]/mL (ref 0.450–4.500)

## 2016-09-07 ENCOUNTER — Other Ambulatory Visit: Payer: Self-pay | Admitting: Adult Health

## 2016-09-07 MED ORDER — SCOPOLAMINE 1 MG/3DAYS TD PT72: 1.0000 | MEDICATED_PATCH | TRANSDERMAL | 0 refills | Status: DC

## 2016-09-07 MED FILL — TRANSDERM-SCOP 1.5 MG/3 DAY: 1 | 9 days supply | Qty: 3 | Fill #0

## 2016-09-07 MED FILL — AMLODIPINE-BENAZEPRIL 5-40: 5-40 | 30 days supply | Qty: 30 | Fill #2

## 2016-09-07 NOTE — Addendum Note (Signed)
Addended by: Fonnie Mu on: 09/07/2016 11:15 AM   Modules accepted: Orders

## 2016-09-07 NOTE — Telephone Encounter (Signed)
Ok to fill! Thanks! Tonya Nicholson

## 2016-09-07 NOTE — Telephone Encounter (Signed)
Patient is requesting a prescription for motion sickness, she is getting ready to go on a cruise this weekend and has tried the over the counter meds for it but they do not seem to work as well as the prescription strength. Please advise. If this is approved, please send to [facility]. Their number is [phone].

## 2016-11-17 MED FILL — AMLODIPINE-BENAZEPRIL 5-40: 5-40 | 90 days supply | Qty: 90 | Fill #0

## 2016-12-25 DIAGNOSIS — N921 Excessive and frequent menstruation with irregular cycle: Secondary | ICD-10-CM | POA: Diagnosis not present

## 2016-12-25 DIAGNOSIS — N840 Polyp of corpus uteri: Secondary | ICD-10-CM | POA: Diagnosis not present

## 2017-01-14 DIAGNOSIS — N840 Polyp of corpus uteri: Secondary | ICD-10-CM | POA: Diagnosis not present

## 2017-01-14 DIAGNOSIS — N950 Postmenopausal bleeding: Secondary | ICD-10-CM | POA: Diagnosis not present

## 2017-01-14 DIAGNOSIS — Z3202 Encounter for pregnancy test, result negative: Secondary | ICD-10-CM | POA: Diagnosis not present

## 2017-01-14 MED FILL — OXYCODONE-ACETAMINOPHEN 5-3: 5-325 | 2 days supply | Qty: 8 | Fill #0

## 2017-02-12 DIAGNOSIS — Z6841 Body Mass Index (BMI) 40.0 and over, adult: Secondary | ICD-10-CM | POA: Diagnosis not present

## 2017-02-12 DIAGNOSIS — Z1231 Encounter for screening mammogram for malignant neoplasm of breast: Secondary | ICD-10-CM | POA: Diagnosis not present

## 2017-02-12 DIAGNOSIS — Z01419 Encounter for gynecological examination (general) (routine) without abnormal findings: Secondary | ICD-10-CM | POA: Diagnosis not present

## 2017-02-12 DIAGNOSIS — Z13228 Encounter for screening for other metabolic disorders: Secondary | ICD-10-CM | POA: Diagnosis not present

## 2017-02-12 DIAGNOSIS — Z13220 Encounter for screening for lipoid disorders: Secondary | ICD-10-CM | POA: Diagnosis not present

## 2017-02-12 MED FILL — AMLODIPINE-BENAZEPRIL 5-40: 5-40 | 90 days supply | Qty: 90 | Fill #0

## 2017-06-10 MED FILL — AMLODIPINE-BENAZEPRIL 5-40: 5-40 | 90 days supply | Qty: 90 | Fill #1

## 2017-09-20 MED FILL — AMLODIPINE-BENAZEPRIL 5-40: 5-40 | 90 days supply | Qty: 90 | Fill #2

## 2017-11-11 DIAGNOSIS — H5213 Myopia, bilateral: Secondary | ICD-10-CM | POA: Diagnosis not present

## 2017-12-09 MED FILL — AMLODIPINE-BENAZEPRIL 5-40: 5-40 | 90 days supply | Qty: 90 | Fill #3

## 2018-02-17 ENCOUNTER — Encounter (INDEPENDENT_AMBULATORY_CARE_PROVIDER_SITE_OTHER): Payer: Self-pay

## 2018-02-24 ENCOUNTER — Ambulatory Visit (INDEPENDENT_AMBULATORY_CARE_PROVIDER_SITE_OTHER): Payer: 59 | Admitting: Family Medicine

## 2018-02-24 ENCOUNTER — Encounter (INDEPENDENT_AMBULATORY_CARE_PROVIDER_SITE_OTHER): Payer: Self-pay | Admitting: Family Medicine

## 2018-02-24 VITALS — BP 110/75 | HR 65 | Temp 97.8°F | Ht 67.0 in | Wt 263.0 lb

## 2018-02-24 DIAGNOSIS — Z1331 Encounter for screening for depression: Secondary | ICD-10-CM

## 2018-02-24 DIAGNOSIS — Z9189 Other specified personal risk factors, not elsewhere classified: Secondary | ICD-10-CM

## 2018-02-24 DIAGNOSIS — I10 Essential (primary) hypertension: Secondary | ICD-10-CM | POA: Diagnosis not present

## 2018-02-24 DIAGNOSIS — Z6841 Body Mass Index (BMI) 40.0 and over, adult: Secondary | ICD-10-CM | POA: Diagnosis not present

## 2018-02-24 DIAGNOSIS — R739 Hyperglycemia, unspecified: Secondary | ICD-10-CM | POA: Diagnosis not present

## 2018-02-24 DIAGNOSIS — E559 Vitamin D deficiency, unspecified: Secondary | ICD-10-CM

## 2018-02-24 DIAGNOSIS — R0602 Shortness of breath: Secondary | ICD-10-CM | POA: Diagnosis not present

## 2018-02-24 DIAGNOSIS — R5383 Other fatigue: Secondary | ICD-10-CM | POA: Diagnosis not present

## 2018-02-24 DIAGNOSIS — Z0289 Encounter for other administrative examinations: Secondary | ICD-10-CM

## 2018-02-24 NOTE — Progress Notes (Signed)
Office: [phone]  /  Fax: [phone]   Dear Tonya Fuller D. Danford, NP,   Thank you for referring Tonya Nicholson to our clinic. The following note includes my evaluation and treatment recommendations.  HPI:   Chief Complaint: OBESITY    Tonya Nicholson has been referred by Tonya Fuller D. Danford, NP for consultation regarding her obesity and obesity related comorbidities.    Tonya Nicholson (MR# [id number]) is a 58 y.o. female who presents on 02/24/2018 for obesity evaluation and treatment. Current BMI is Body mass index is 41.19 kg/m.  Tonya Nicholson has been struggling with her weight for many years and has been unsuccessful in either losing weight, maintaining weight loss, or reaching her healthy weight goal.     Tonya Nicholson attended our information session and states she is currently in the action stage of change and ready to dedicate time achieving and maintaining a healthier weight. Tonya Nicholson is interested in becoming our patient and working on intensive lifestyle modifications including (but not limited to) diet, exercise and weight loss.    Tonya Nicholson states she thinks her family will eat healthier with her her desired weight loss is 83 lbs she has been heavy most of her life she started gaining weight when she was a pre-teen her heaviest weight ever was 285 lbs. she skips dinner frequently she is frequently drinking liquids with calories she frequently makes poor food choices she struggles with emotional eating    Tonya Nicholson feels her energy is lower than it should be. This has worsened with weight gain and has not worsened recently. Tonya Nicholson admits to daytime somnolence and admits to waking up still tired. Patient is at risk for obstructive sleep apnea. Patent has a history of symptoms of daytime Tonya and hypertension. Patient generally gets 6 or 7 hours of sleep per night, and states they generally have restless sleep. Snoring is present. Apneic episodes were present, but not lately. Epworth Sleepiness  Score is 10.  Dyspnea on exertion Tonya Nicholson notes increasing shortness of breath with exercising and seems to be worsening over time with weight gain. She notes getting out of breath sooner with activity than she used to. This has not gotten worse recently. Tonya Nicholson denies orthopnea.  Hypertension Tonya Nicholson is a 58 y.o. female with hypertension. Tonya Nicholson's blood pressure is stable on medications. She is working on weight loss to help control her blood pressure with the goal of decreasing her risk of heart attack and stroke. Tonya Nicholson denies chest pain or headaches.  At risk for cardiovascular disease Tonya Nicholson is at a higher than average risk for cardiovascular disease due to hypertension and obesity. She currently denies any chest pain.  Vitamin D deficiency Tonya Nicholson has a diagnosis of vitamin D deficiency. She is currently taking vit D and does not have recent labs. She admits Tonya.  Hyperglycemia Tonya Nicholson has an A1c of 6.4 in Epic from 2018 and denies being told of any blood glucose issues. She admits polyphagia, which is worse in the evening and she denies hypoglycemia.  Depression Screen Tonya Nicholson's Food and Mood (modified PHQ-9) score was 8. Depression screen PHQ 2/9 02/24/2018  Decreased Interest 2  Down, Depressed, Hopeless 1  PHQ - 2 Score 3  Altered sleeping 1  Tired, decreased energy 1  Change in appetite 1  Feeling bad or failure about yourself  2  Trouble concentrating 0  Moving slowly or fidgety/restless 0  Suicidal thoughts 0  PHQ-9 Score 8  Difficult doing work/chores Somewhat difficult    ASSESSMENT  AND PLAN:  Other Tonya - Plan: EKG 12-Lead, Vitamin B12, CBC With Differential, Folate, T3, T4, free, TSH, EKG 12-Lead  Shortness of breath on exertion - Plan: Lipid Panel With LDL/HDL Ratio, Troponin T  Essential hypertension  Vitamin D deficiency - Plan: VITAMIN D 25 Hydroxy (Vit-D Deficiency, Fractures)  Hyperglycemia - Plan: Comprehensive metabolic panel, Hemoglobin A1c,  Insulin, random  Depression screening  At risk for heart disease  Class 3 severe obesity with serious comorbidity and body mass index (BMI) of 40.0 to 44.9 in adult, unspecified obesity type (HCC)  PLAN:  Tonya Nicholson was informed that her Tonya may be related to obesity, depression or many other causes. Labs will be ordered, and in the meanwhile Tonya Nicholson has agreed to work on diet, exercise and weight loss to help with Tonya. Proper sleep hygiene was discussed including the need for 7-8 hours of quality sleep each night. A sleep study was not ordered based on symptoms and Epworth score. An EKG and an indirect calorimetry was ordered today. Tonya Nicholson agrees to follow up in 2 weeks.  Dyspnea on exertion Tonya Nicholson's shortness of breath appears to be obesity related and exercise induced. She has agreed to work on weight loss and gradually increase exercise to treat her exercise induced shortness of breath. If Tonya Nicholson follows our instructions and loses weight without improvement of her shortness of breath, we will plan to refer to pulmonology. We will monitor this condition regularly. We ordered an indirect calorimetry, labs, and an EKG today. Tonya Nicholson agrees to this plan.  Hypertension We discussed sodium restriction, working on healthy weight loss, and a regular exercise program as the means to achieve improved blood pressure control. We will continue to monitor her blood pressure as well as her progress with the above lifestyle modifications. She will continue her medications as prescribed and will watch for signs of hypotension as she continues her lifestyle modifications. We ordered labs today. She will start her diet and continue her medications. Tonya Nicholson agreed with this plan and agreed to follow up as directed in 2 weeks.  Cardiovascular risk counseling Tonya Nicholson was given extended (15 minutes) coronary artery disease prevention counseling today. She is 58 y.o. female and has risk factors for heart disease  including hypertension and obesity. We discussed intensive lifestyle modifications today with an emphasis on specific weight loss instructions and strategies. Pt was also informed of the importance of increasing exercise and decreasing saturated fats to help prevent heart disease.  Vitamin D Deficiency Tonya Nicholson was informed that low vitamin D levels contributes to Tonya and are associated with obesity, breast, and colon cancer. She agrees to continue to take OTC @1 ,000 IU every day and will follow up for routine testing of vitamin D, at least 2-3 times per year. She was informed of the risk of over-replacement of vitamin D and agrees to not increase her dose unless she discusses this with [place] first. We will obtain her vitamin D level today and Tonya Nicholson agrees to follow up as directed.  Hyperglycemia Fasting labs will be obtained and results with be discussed with Tonya Nicholson in 2 weeks at her follow up visit. In the meanwhile Tonya Nicholson was started on a lower simple carbohydrate diet and will work on weight loss efforts. Tonya Nicholson will start her diet and follow up at the agreed upon time.  Depression Screen Tonya Nicholson had a mildly positive depression screening. Depression is commonly associated with obesity and often results in emotional eating behaviors. We will monitor this closely and work on CBT  to help improve the non-hunger eating patterns. Referral to Psychology may be required if no improvement is seen as she continues in our clinic.  Obesity Tonee is currently in the action stage of change and her goal is to continue with weight loss efforts. I recommend Tonya Nicholson begin the structured treatment plan as follows:  She has agreed to follow the Category 2 plan  + 100 calories. Tonya Nicholson has been instructed to eventually work up to a goal of 150 minutes of combined cardio and strengthening exercise per week for weight loss and overall health benefits. We discussed the following Behavioral Modification Strategies today:  increasing lean protein intake, decreasing simple carbohydrates, and work on meal planning and easy cooking plans.   She was informed of the importance of frequent follow up visits to maximize her success with intensive lifestyle modifications for her multiple health conditions. She was informed we would discuss her lab results at her next visit unless there is a critical issue that needs to be addressed sooner. Ivania agreed to keep her next visit at the agreed upon time to discuss these results.  ALLERGIES: No Known Allergies  MEDICATIONS: Current Outpatient Medications on File Prior to Visit  Medication Sig Dispense Refill  . amLODipine-benazepril (LOTREL) 5-40 MG per capsule TAKE 1 CAPSULE BY MOUTH DAILY. 90 capsule 0  . cholecalciferol (VITAMIN D3) 25 MCG (1000 UT) tablet Take 3,000 Units by mouth daily.    . Potassium 99 MG TABS Take 2 tablets by mouth daily.     No current facility-administered medications on file prior to visit.     PAST MEDICAL HISTORY: Past Medical History:  Diagnosis Date  . Dyspnea   . GERD (gastroesophageal reflux disease)   . Hypertension   . Joint pain   . Knee pain   . Lower extremity edema     PAST SURGICAL HISTORY: History reviewed. No pertinent surgical history.  SOCIAL HISTORY: Social History   Tobacco Use  . Smoking status: Never Smoker  . Smokeless tobacco: Never Used  Substance Use Topics  . Alcohol use: Yes    Alcohol/week: 9.0 standard drinks    Types: 7 Standard drinks or equivalent, 2 Shots of liquor per week  . Drug use: No    FAMILY HISTORY: Family History  Problem Relation Age of Onset  . Diabetes Mother   . Hypertension Mother   . Diabetes Father   . Cancer Father        bladders  . Hypertension Father   . Heart disease Father   . Healthy Sister   . Diabetes Brother     ROS: Review of Systems  Constitutional: Positive for malaise/Tonya. Negative for weight loss.  Eyes:       Wears glasses or contacts.    Respiratory: Positive for shortness of breath ( with activity).   Cardiovascular: Negative for chest pain and orthopnea.       Positive for leg cramping.  Musculoskeletal: Positive for joint pain and myalgias.  Neurological: Negative for headaches.  Endo/Heme/Allergies:       Positive for hyperglycemia. Negative for hypoglycemia. Positive for polyphagia.  Psychiatric/Behavioral: The patient has insomnia.        Positive for stress.   PHYSICAL EXAM: Blood pressure 110/75, pulse 65, temperature 97.8 F (36.6 C), temperature source Oral, height 5\' 7"  (1.702 m), weight 263 lb (119.3 kg), last menstrual period 04/09/2016, SpO2 98 %. Body mass index is 41.19 kg/m. Physical Exam Vitals signs reviewed.  Constitutional:  Appearance: Normal appearance. She is obese.  HENT:     Head: Normocephalic and atraumatic.     Nose: Nose normal.  Eyes:     General: No scleral icterus.    Extraocular Movements: Extraocular movements intact.  Neck:     Musculoskeletal: Normal range of motion and neck supple.     Thyroid: No thyromegaly.     Comments: Negative for thyromegaly. Cardiovascular:     Rate and Rhythm: Normal rate and regular rhythm.  Pulmonary:     Effort: Pulmonary effort is normal. No respiratory distress.  Abdominal:     Palpations: Abdomen is soft.     Tenderness: There is no abdominal tenderness.     Comments: Positive for obesity.  Musculoskeletal:     Right lower leg: Edema ( 1+) present.     Left lower leg: Edema ( 1+) present.     Comments: ROM normal in all extremities.  Skin:    General: Skin is warm and dry.  Neurological:     Mental Status: She is alert and oriented to person, place, and time.     Coordination: Coordination normal.  Psychiatric:        Mood and Affect: Mood normal.        Behavior: Behavior normal.    RECENT LABS AND TESTS: BMET    Component Value Date/Time   NA 140 05/08/2016 0829   K 4.5 05/08/2016 0829   CL 103 05/08/2016 0829    CO2 25 05/08/2016 0829   GLUCOSE 151 (H) 05/08/2016 0829   BUN 13 05/08/2016 0829   CREATININE 0.80 05/08/2016 0829   CALCIUM 8.8 05/08/2016 0829   GFRNONAA 83 05/08/2016 0829   GFRAA 95 05/08/2016 0829   Lab Results  Component Value Date   HGBA1C 6.4 (H) 05/08/2016   No results found for: INSULIN CBC    Component Value Date/Time   WBC 7.5 05/08/2016 0829   RBC 4.90 05/08/2016 0829   HGB 14.2 05/08/2016 0829   HCT 43.3 05/08/2016 0829   PLT 273 05/08/2016 0829   MCV 88 05/08/2016 0829   MCH 29.0 05/08/2016 0829   MCHC 32.8 05/08/2016 0829   RDW 13.2 05/08/2016 0829   LYMPHSABS 1.7 05/08/2016 0829   EOSABS 0.2 05/08/2016 0829   BASOSABS 0.1 05/08/2016 0829   Iron/TIBC/Ferritin/ %Sat No results found for: IRON, TIBC, FERRITIN, IRONPCTSAT Lipid Panel     Component Value Date/Time   CHOL 144 05/08/2016 0829   TRIG 78 05/08/2016 0829   HDL 51 05/08/2016 0829   CHOLHDL 2.8 05/08/2016 0829   LDLCALC 77 05/08/2016 0829   Hepatic Function Panel     Component Value Date/Time   PROT 6.4 05/08/2016 0829   ALBUMIN 3.9 05/08/2016 0829   AST 16 05/08/2016 0829   ALT 20 05/08/2016 0829   ALKPHOS 102 05/08/2016 0829   BILITOT 0.5 05/08/2016 0829      Component Value Date/Time   TSH 1.020 05/08/2016 0829   ECG  shows NSR with a rate of 103 BPM. INDIRECT CALORIMETER done today shows a VO2 of 214 and a REE of 1491.  Her calculated basal metabolic rate is 4782 thus her basal metabolic rate is worse than expected.  OBESITY BEHAVIORAL INTERVENTION VISIT  Today's visit was # 1   Starting weight: 263 lbs Starting date: 02/24/2018 Today's weight : Weight: 263 lb (119.3 kg)  Today's date: 02/24/2018 Total lbs lost to date: 0  ASK: We discussed the diagnosis of obesity with Lydia Guiles  Polkowski today and Amanee agreed to give [place] permission to discuss obesity behavioral modification therapy today.  ASSESS: Charidy has the diagnosis of obesity and her BMI today is 41.1. Aanvi is in  the action stage of change.   ADVISE: Carlissa was educated on the multiple health risks of obesity as well as the benefit of weight loss to improve her health. She was advised of the need for long term treatment and the importance of lifestyle modifications to improve her current health and to decrease her risk of future health problems.  AGREE: Multiple dietary modification options and treatment options were discussed and Gola agreed to follow the recommendations documented in the above note.  ARRANGE: Ainslie was educated on the importance of frequent visits to treat obesity as outlined per CMS and USPSTF guidelines and agreed to schedule her next follow up appointment today.  IMarcille Blanco, CMA, am acting as transcriptionist for Starlyn Skeans, MD   I have reviewed the above documentation for accuracy and completeness, and I agree with the above. -Dennard Nip, MD

## 2018-02-25 ENCOUNTER — Encounter: Payer: 59 | Admitting: Cardiology

## 2018-02-25 ENCOUNTER — Encounter: Payer: Self-pay | Admitting: Cardiology

## 2018-02-25 ENCOUNTER — Ambulatory Visit: Payer: 59 | Admitting: Cardiology

## 2018-02-25 ENCOUNTER — Ambulatory Visit: Payer: 59 | Admitting: Internal Medicine

## 2018-02-25 VITALS — BP 118/72 | HR 99 | Ht 67.0 in | Wt 266.0 lb

## 2018-02-25 DIAGNOSIS — I4891 Unspecified atrial fibrillation: Secondary | ICD-10-CM | POA: Diagnosis not present

## 2018-02-25 MED ORDER — APIXABAN 5 MG PO TABS: 5.0000 mg | ORAL_TABLET | Freq: Two times a day (BID) | ORAL | 3 refills | Status: DC

## 2018-02-25 MED ORDER — METOPROLOL TARTRATE 25 MG PO TABS: 25.0000 mg | ORAL_TABLET | Freq: Two times a day (BID) | ORAL | 3 refills | Status: DC

## 2018-02-25 MED FILL — ELIQUIS 5 MG TABLET: 5 | 30 days supply | Qty: 60 | Fill #0

## 2018-02-25 MED FILL — METOPROLOL TARTRATE 25 MG T: 25 | 30 days supply | Qty: 60 | Fill #0

## 2018-02-25 NOTE — Progress Notes (Signed)
This encounter was created in error - please disregard.

## 2018-02-25 NOTE — H&P (View-Only) (Signed)
Electrophysiology Office Note   Date:  02/25/2018   ID:  Tonya Nicholson, DOB [birth year], MRN [id number]  PCP:  Esaw Grandchild, NP  Cardiologist:   Primary Electrophysiologist:  Macaela Presas Meredith Leeds, MD    No chief complaint on file.    History of Present Illness: Tonya Nicholson is a 58 y.o. female who is being seen today for the evaluation of atrial fibrillation at the request of Danford, Berna Spare, NP. Presenting today for electrophysiology evaluation.  She has a history of morbid obesity and hypertension.  She presented to her primary physician's office with fatigue.  She felt that her energy was lower than it should be.  This is worsened with some recent weight gain.  But not worsened recently.  She does have daytime somnolence and admits to waking up feeling tired.  She does snore.  Today, she denies symptoms of palpitations, chest pain, shortness of breath, orthopnea, PND, lower extremity edema, claudication, dizziness, presyncope, syncope, bleeding, or neurologic sequela. The patient is tolerating medications without difficulties.    Past Medical History:  Diagnosis Date  . Dyspnea   . GERD (gastroesophageal reflux disease)   . Hypertension   . Joint pain   . Knee pain   . Lower extremity edema    History reviewed. No pertinent surgical history.   Current Outpatient Medications  Medication Sig Dispense Refill  . amLODipine-benazepril (LOTREL) 5-40 MG per capsule TAKE 1 CAPSULE BY MOUTH DAILY. 90 capsule 0  . apixaban (ELIQUIS) 5 MG TABS tablet Take 1 tablet (5 mg total) by mouth 2 (two) times daily. 60 tablet 3  . cholecalciferol (VITAMIN D3) 25 MCG (1000 UT) tablet Take 3,000 Units by mouth daily.    . metoprolol tartrate (LOPRESSOR) 25 MG tablet Take 1 tablet (25 mg total) by mouth 2 (two) times daily. 60 tablet 3  . Potassium 99 MG TABS Take 2 tablets by mouth daily.     No current facility-administered medications for this visit.     Allergies:   Patient has no  known allergies.   Social History:  The patient  reports that she has never smoked. She has never used smokeless tobacco. She reports current alcohol use of about 9.0 standard drinks of alcohol per week. She reports that she does not use drugs.   Family History:  The patient's family history includes Cancer in her father; Diabetes in her brother, father, and mother; Healthy in her sister; Heart disease in her father; Hypertension in her father and mother.    ROS:  Please see the history of present illness.   Otherwise, review of systems is positive for weak, fatigue.   All other systems are reviewed and negative.    PHYSICAL EXAM: VS:  BP 118/72   Pulse 99   Ht 5\' 7"  (1.702 m)   Wt 266 lb (120.7 kg)   LMP 04/09/2016 (Approximate)   BMI 41.66 kg/m  , BMI Body mass index is 41.66 kg/m. GEN: Well nourished, well developed, in no acute distress  HEENT: normal  Neck: no JVD, carotid bruits, or masses Cardiac: iRRR; no murmurs, rubs, or gallops,no edema  Respiratory:  clear to auscultation bilaterally, normal work of breathing GI: soft, nontender, nondistended, + BS MS: no deformity or atrophy  Skin: warm and dry Neuro:  Strength and sensation are intact Psych: euthymic mood, full affect  EKG:  EKG is not ordered today. Personal review of the ekg ordered 02/24/17 shows atrial fibrillation, rate between 128 and  [place]: 02/24/2018: ALT 15; BUN 12; Creatinine, Ser 0.86; Hemoglobin 14.5; Potassium 4.0; Sodium 141; TSH 1.410    Lipid Panel     Component Value Date/Time   CHOL 143 02/24/2018 1508   TRIG 100 02/24/2018 1508   HDL 52 02/24/2018 1508   CHOLHDL 2.8 05/08/2016 0829   LDLCALC 71 02/24/2018 1508     Wt Readings from Last 3 Encounters:  02/25/18 266 lb (120.7 kg)  02/25/18 266 lb (120.7 kg)  02/24/18 263 lb (119.3 kg)      Other studies Reviewed: Additional studies/ records that were reviewed today include: epic notes  ASSESSMENT AND PLAN:  1.  Atrial  fibrillation: Unclear if this is persistent or paroxysmal.  She has had some fatigue for quite some time.  As this is her first episode of atrial fibrillation known, we Endi Lagman get a transthoracic echo.  We Nimra Puccinelli also schedule her for a cardioversion after being on Eliquis for 3 weeks.  We Mateja Dier start her on Eliquis today.  Her heart rates have also been fast at times.  We Perl Folmar start her on 25 mg of metoprolol today.  This patients CHA2DS2-VASc Score and unadjusted Ischemic Stroke Rate (% per year) is equal to 2.2 % stroke rate/year from a score of 2  Above score calculated as 1 point each if present [CHF, HTN, DM, Vascular=MI/PAD/Aortic Plaque, Age if 65-74, or Female] Above score calculated as 2 points each if present [Age > 75, or Stroke/TIA/TE]  2. Hypertension: Early well-controlled.  No changes.  3.  Snoring and daytime somnolence: She does have morbid obesity.  Shyonna Carlin order a sleep study to evaluate for sleep apnea.  Current medicines are reviewed at length with the patient today.   The patient does not have concerns regarding her medicines.  The following changes were made today: Metoprolol, Eliquis  Labs/ tests ordered today include:  Orders Placed This Encounter  Procedures  . ECHOCARDIOGRAM COMPLETE   As discussed with primary physician  Disposition:   FU with Idalie Canto 6 weeks  Signed, Tali Coster Meredith Leeds, MD  02/25/2018 8:48 AM     [address] [place] [address] [phone] (office) [phone] (fax)

## 2018-02-25 NOTE — Progress Notes (Signed)
Electrophysiology Office Note   Date:  02/25/2018   ID:  KAHLIE Nicholson, DOB 05/03/1960, MRN [id number]  PCP:  Esaw Grandchild, NP  Cardiologist:   Primary Electrophysiologist:  Vanessia Bokhari Meredith Leeds, MD    No chief complaint on file.    History of Present Illness: Tonya Nicholson is a 58 y.o. female who is being seen today for the evaluation of atrial fibrillation at the request of Danford, Berna Spare, NP. Presenting today for electrophysiology evaluation.  She has a history of morbid obesity and hypertension.  She presented to her primary physician's office with fatigue.  She felt that her energy was lower than it should be.  This is worsened with some recent weight gain.  But not worsened recently.  She does have daytime somnolence and admits to waking up feeling tired.  She does snore.  Today, she denies symptoms of palpitations, chest pain, shortness of breath, orthopnea, PND, lower extremity edema, claudication, dizziness, presyncope, syncope, bleeding, or neurologic sequela. The patient is tolerating medications without difficulties.    Past Medical History:  Diagnosis Date  . Dyspnea   . GERD (gastroesophageal reflux disease)   . Hypertension   . Joint pain   . Knee pain   . Lower extremity edema    History reviewed. No pertinent surgical history.   Current Outpatient Medications  Medication Sig Dispense Refill  . amLODipine-benazepril (LOTREL) 5-40 MG per capsule TAKE 1 CAPSULE BY MOUTH DAILY. 90 capsule 0  . apixaban (ELIQUIS) 5 MG TABS tablet Take 1 tablet (5 mg total) by mouth 2 (two) times daily. 60 tablet 3  . cholecalciferol (VITAMIN D3) 25 MCG (1000 UT) tablet Take 3,000 Units by mouth daily.    . metoprolol tartrate (LOPRESSOR) 25 MG tablet Take 1 tablet (25 mg total) by mouth 2 (two) times daily. 60 tablet 3  . Potassium 99 MG TABS Take 2 tablets by mouth daily.     No current facility-administered medications for this visit.     Allergies:   Patient has no  known allergies.   Social History:  The patient  reports that she has never smoked. She has never used smokeless tobacco. She reports current alcohol use of about 9.0 standard drinks of alcohol per week. She reports that she does not use drugs.   Family History:  The patient's family history includes Cancer in her father; Diabetes in her brother, father, and mother; Healthy in her sister; Heart disease in her father; Hypertension in her father and mother.    ROS:  Please see the history of present illness.   Otherwise, review of systems is positive for weak, fatigue.   All other systems are reviewed and negative.    PHYSICAL EXAM: VS:  BP 118/72   Pulse 99   Ht 5\' 7"  (1.702 m)   Wt 266 lb (120.7 kg)   LMP 04/09/2016 (Approximate)   BMI 41.66 kg/m  , BMI Body mass index is 41.66 kg/m. GEN: Well nourished, well developed, in no acute distress  HEENT: normal  Neck: no JVD, carotid bruits, or masses Cardiac: iRRR; no murmurs, rubs, or gallops,no edema  Respiratory:  clear to auscultation bilaterally, normal work of breathing GI: soft, nontender, nondistended, + BS MS: no deformity or atrophy  Skin: warm and dry Neuro:  Strength and sensation are intact Psych: euthymic mood, full affect  EKG:  EKG is not ordered today. Personal review of the ekg ordered 02/24/17 shows atrial fibrillation, rate between 128 and  [place]: 02/24/2018: ALT 15; BUN 12; Creatinine, Ser 0.86; Hemoglobin 14.5; Potassium 4.0; Sodium 141; TSH 1.410    Lipid Panel     Component Value Date/Time   CHOL 143 02/24/2018 1508   TRIG 100 02/24/2018 1508   HDL 52 02/24/2018 1508   CHOLHDL 2.8 05/08/2016 0829   LDLCALC 71 02/24/2018 1508     Wt Readings from Last 3 Encounters:  02/25/18 266 lb (120.7 kg)  02/25/18 266 lb (120.7 kg)  02/24/18 263 lb (119.3 kg)      Other studies Reviewed: Additional studies/ records that were reviewed today include: epic notes  ASSESSMENT AND PLAN:  1.  Atrial  fibrillation: Unclear if this is persistent or paroxysmal.  She has had some fatigue for quite some time.  As this is her first episode of atrial fibrillation known, we Briar Witherspoon get a transthoracic echo.  We Crucita Lacorte also schedule her for a cardioversion after being on Eliquis for 3 weeks.  We Rutger Salton start her on Eliquis today.  Her heart rates have also been fast at times.  We Kytzia Gienger start her on 25 mg of metoprolol today.  This patients CHA2DS2-VASc Score and unadjusted Ischemic Stroke Rate (% per year) is equal to 2.2 % stroke rate/year from a score of 2  Above score calculated as 1 point each if present [CHF, HTN, DM, Vascular=MI/PAD/Aortic Plaque, Age if 65-74, or Female] Above score calculated as 2 points each if present [Age > 75, or Stroke/TIA/TE]  2. Hypertension: Early well-controlled.  No changes.  3.  Snoring and daytime somnolence: She does have morbid obesity.  Tamie Minteer order a sleep study to evaluate for sleep apnea.  Current medicines are reviewed at length with the patient today.   The patient does not have concerns regarding her medicines.  The following changes were made today: Metoprolol, Eliquis  Labs/ tests ordered today include:  Orders Placed This Encounter  Procedures  . ECHOCARDIOGRAM COMPLETE   As discussed with primary physician  Disposition:   FU with Dustin Burrill 6 weeks  Signed, Rayce Brahmbhatt Meredith Leeds, MD  02/25/2018 8:48 AM     [address] [place] [address] [phone] (office) [phone] (fax)

## 2018-02-25 NOTE — Patient Instructions (Addendum)
Medication Instructions:  Your physician has recommended you make the following change in your medication:  1. START Metoprolol Tartrate (Lopressor) 25 mg twice daily 2. START Eliquis 5 mg twice daily  * If you need a refill on your cardiac medications before your next appointment, please call your pharmacy.   Labwork: None ordered  Testing/Procedures: Your physician has recommended that you have a Cardioversion (DCCV) in about 3/more weeks. Electrical Cardioversion uses a jolt of electricity to your heart either through paddles or wired patches attached to your chest. This is a controlled, usually prescheduled, procedure. Defibrillation is done under light anesthesia in the hospital, and you usually go home the day of the procedure. This is done to get your heart back into a normal rhythm. You are not awake for the procedure. Please see the instructions below under special instructions.  Follow-Up: Your physician recommends that you schedule a follow-up appointment in: 6 weeks with Dr. Curt Bears.  Nurse will arrange this follow up when they schedule your cardioversion  Thank you for choosing [facility]!!   Trinidad Curet, RN [phone]  Any Other Special Instructions Will Be Listed Below (If Applicable).  Your provider has recommended a cardioversion.   You are scheduled for a cardioversion on _____  with Dr. _______ or associates. Please go to [facility] at ______.   Enter through the [place] not have any food or drink after midnight on _______.  Medication instructions will be given once procedure is scheduled. You will need someone to drive you home following your procedure.   Call the [place] office at [phone] if you have any questions, problems or concerns.      Electrical Cardioversion Electrical cardioversion is the delivery of a jolt of electricity to change the rhythm of the heart. Sticky patches or metal  paddles are placed on the chest to deliver the electricity from a device. This is done to restore a normal rhythm. A rhythm that is too fast or not regular keeps the heart from pumping well. Electrical cardioversion is done in an emergency if:   There is low or no blood pressure as a result of the heart rhythm.    Normal rhythm must be restored as fast as possible to protect the brain and heart from further damage.    It may save a life. Cardioversion may be done for heart rhythms that are not immediately life threatening, such as atrial fibrillation or flutter, in which:   The heart is beating too fast or is not regular.    Medicine to change the rhythm has not worked.    It is safe to wait in order to allow time for preparation.  Symptoms of the abnormal rhythm are bothersome.  The risk of stroke and other serious problems can be reduced.  LET [facility] CARE PROVIDER KNOW ABOUT:   Any allergies you have.  All medicines you are taking, including vitamins, herbs, eye drops, creams, and over-the-counter medicines.  Previous problems you or members of your family have had with the use of anesthetics.    Any blood disorders you have.    Previous surgeries you have had.    Medical conditions you have.   RISKS AND COMPLICATIONS  Generally, this is a safe procedure. However, problems can occur and include:   Breathing problems related to the anesthetic used.  A blood clot that breaks free and travels to other parts of your body. This could  cause a stroke or other problems. The risk of this is lowered by use of blood-thinning medicine (anticoagulant) prior to the procedure.  Cardiac arrest (rare).   BEFORE THE PROCEDURE   You may have tests to detect blood clots in your heart and to evaluate heart function.   You may start taking anticoagulants so your blood does not clot as easily.    Medicines may be given to help stabilize your heart rate and  rhythm.   PROCEDURE  You will be given medicine through an IV tube to reduce discomfort and make you sleepy (sedative).    An electrical shock will be delivered.   AFTER THE PROCEDURE Your heart rhythm will be watched to make sure it does not change. You will need someone to drive you home.

## 2018-02-27 LAB — COMPREHENSIVE METABOLIC PANEL
ALBUMIN: 4.2 g/dL (ref 3.8–4.9)
ALT: 15 IU/L (ref 0–32)
AST: 15 IU/L (ref 0–40)
Albumin/Globulin Ratio: 1.8 (ref 1.2–2.2)
Alkaline Phosphatase: 84 IU/L (ref 39–117)
BUN/Creatinine Ratio: 14 (ref 9–23)
BUN: 12 mg/dL (ref 6–24)
Bilirubin Total: 0.6 mg/dL (ref 0.0–1.2)
CO2: 21 mmol/L (ref 20–29)
Calcium: 9.2 mg/dL (ref 8.7–10.2)
Chloride: 102 mmol/L (ref 96–106)
Creatinine, Ser: 0.86 mg/dL (ref 0.57–1.00)
GFR calc Af Amer: 87 mL/min/{1.73_m2} (ref >59)
GFR calc non Af Amer: 75 mL/min/{1.73_m2} (ref >59)
GLOBULIN, TOTAL: 2.4 g/dL (ref 1.5–4.5)
Glucose: 105 mg/dL — ABNORMAL HIGH (ref 65–99)
Potassium: 4 mmol/L (ref 3.5–5.2)
SODIUM: 141 mmol/L (ref 134–144)
Total Protein: 6.6 g/dL (ref 6.0–8.5)

## 2018-02-27 LAB — CBC WITH DIFFERENTIAL
BASOS: 1 %
Basophils Absolute: 0.1 10*3/uL (ref 0.0–0.2)
EOS (ABSOLUTE): 0.1 10*3/uL (ref 0.0–0.4)
Eos: 1 %
HEMOGLOBIN: 14.5 g/dL (ref 11.1–15.9)
Hematocrit: 43.7 % (ref 34.0–46.6)
IMMATURE GRANS (ABS): 0 10*3/uL (ref 0.0–0.1)
Immature Granulocytes: 1 %
LYMPHS: 22 %
Lymphocytes Absolute: 1.7 10*3/uL (ref 0.7–3.1)
MCH: 29.9 pg (ref 26.6–33.0)
MCHC: 33.2 g/dL (ref 31.5–35.7)
MCV: 90 fL (ref 79–97)
Monocytes Absolute: 0.5 10*3/uL (ref 0.1–0.9)
Monocytes: 7 %
Neutrophils Absolute: 5.1 10*3/uL (ref 1.4–7.0)
Neutrophils: 68 %
RBC: 4.85 x10E6/uL (ref 3.77–5.28)
RDW: 12.1 % (ref 11.7–15.4)
WBC: 7.5 10*3/uL (ref 3.4–10.8)

## 2018-02-27 LAB — LIPID PANEL WITH LDL/HDL RATIO
Cholesterol, Total: 143 mg/dL (ref 100–199)
HDL: 52 mg/dL (ref >39)
LDL CALC: 71 mg/dL (ref 0–99)
LDl/HDL Ratio: 1.4 ratio (ref 0.0–3.2)
Triglycerides: 100 mg/dL (ref 0–149)
VLDL Cholesterol Cal: 20 mg/dL (ref 5–40)

## 2018-02-27 LAB — VITAMIN B12: Vitamin B-12: 363 pg/mL (ref 232–1245)

## 2018-02-27 LAB — T3: T3, Total: 120 ng/dL (ref 71–180)

## 2018-02-27 LAB — VITAMIN D 25 HYDROXY (VIT D DEFICIENCY, FRACTURES): Vit D, 25-Hydroxy: 20.7 ng/mL — ABNORMAL LOW (ref 30.0–100.0)

## 2018-02-27 LAB — T4, FREE: Free T4: 1.33 ng/dL (ref 0.82–1.77)

## 2018-02-27 LAB — HEMOGLOBIN A1C
Est. average glucose Bld gHb Est-mCnc: 131 mg/dL
Hgb A1c MFr Bld: 6.2 % — ABNORMAL HIGH (ref 4.8–5.6)

## 2018-02-27 LAB — TSH: TSH: 1.41 u[IU]/mL (ref 0.450–4.500)

## 2018-02-27 LAB — TROPONIN T: Troponin T TROPT: <0.01 ng/mL (ref <0.011)

## 2018-02-27 LAB — INSULIN, RANDOM: INSULIN: 10.7 u[IU]/mL (ref 2.6–24.9)

## 2018-02-27 LAB — FOLATE: Folate: 11 ng/mL (ref >3.0)

## 2018-03-01 ENCOUNTER — Telehealth: Payer: Self-pay | Admitting: *Deleted

## 2018-03-01 NOTE — Telephone Encounter (Signed)
Pt started Eliquis p.m. of 1/31 Labs on 1/31 DCCV scheduled for 2/24  Pt advised to arrive at 8:45am  NPO after MN the night before procedure  Pt to have driver home Post procedure f/u w/ Camnitz scheduled for 3/27. Patient verbalized understanding and agreeable to plan.

## 2018-03-04 ENCOUNTER — Ambulatory Visit (INDEPENDENT_AMBULATORY_CARE_PROVIDER_SITE_OTHER): Payer: 59

## 2018-03-04 DIAGNOSIS — I4891 Unspecified atrial fibrillation: Secondary | ICD-10-CM

## 2018-03-10 ENCOUNTER — Ambulatory Visit (INDEPENDENT_AMBULATORY_CARE_PROVIDER_SITE_OTHER): Payer: Self-pay | Admitting: Family Medicine

## 2018-03-10 ENCOUNTER — Encounter (INDEPENDENT_AMBULATORY_CARE_PROVIDER_SITE_OTHER): Payer: Self-pay

## 2018-03-10 DIAGNOSIS — Z6841 Body Mass Index (BMI) 40.0 and over, adult: Secondary | ICD-10-CM | POA: Diagnosis not present

## 2018-03-10 DIAGNOSIS — Z1231 Encounter for screening mammogram for malignant neoplasm of breast: Secondary | ICD-10-CM | POA: Diagnosis not present

## 2018-03-10 DIAGNOSIS — Z01419 Encounter for gynecological examination (general) (routine) without abnormal findings: Secondary | ICD-10-CM | POA: Diagnosis not present

## 2018-03-11 MED FILL — AMLODIPINE-BENAZEPRIL 5-40: 5-40 | 30 days supply | Qty: 30 | Fill #0

## 2018-03-17 ENCOUNTER — Ambulatory Visit (INDEPENDENT_AMBULATORY_CARE_PROVIDER_SITE_OTHER): Payer: 59 | Admitting: Family Medicine

## 2018-03-17 ENCOUNTER — Ambulatory Visit (INDEPENDENT_AMBULATORY_CARE_PROVIDER_SITE_OTHER): Payer: 59 | Admitting: Physician Assistant

## 2018-03-17 ENCOUNTER — Encounter (INDEPENDENT_AMBULATORY_CARE_PROVIDER_SITE_OTHER): Payer: Self-pay | Admitting: Family Medicine

## 2018-03-17 VITALS — BP 94/57 | HR 72 | Ht 67.0 in | Wt 255.0 lb

## 2018-03-17 DIAGNOSIS — I4891 Unspecified atrial fibrillation: Secondary | ICD-10-CM

## 2018-03-17 DIAGNOSIS — E559 Vitamin D deficiency, unspecified: Secondary | ICD-10-CM

## 2018-03-17 DIAGNOSIS — Z6839 Body mass index (BMI) 39.0-39.9, adult: Secondary | ICD-10-CM

## 2018-03-17 DIAGNOSIS — R7303 Prediabetes: Secondary | ICD-10-CM

## 2018-03-17 DIAGNOSIS — Z9189 Other specified personal risk factors, not elsewhere classified: Secondary | ICD-10-CM

## 2018-03-17 MED ORDER — VITAMIN D (ERGOCALCIFEROL) 1.25 MG (50000 UNIT) PO CAPS: 50000.0000 [IU] | ORAL_CAPSULE | ORAL | 0 refills | Status: DC

## 2018-03-17 MED FILL — VIT D2 1.25 MG (50,000 UNIT: 1.25 MG | 28 days supply | Qty: 4 | Fill #0

## 2018-03-17 NOTE — Progress Notes (Signed)
Office: [phone]  /  Fax: [phone]   HPI:   Chief Complaint: OBESITY Tonya Nicholson is here to discuss her progress with her obesity treatment plan. She is on the Category 2 plan and is following her eating plan approximately 80 % of the time. She states she is exercising 0 minutes 0 times per week. Tonya Nicholson did well with the Category 2 plan, but she didn't eat all of her dinner many nights. She noted daytime polyphagia and she did well with meal planning.  Her weight is 255 lb (115.7 kg) today and has had a weight loss of 8 pounds over a period of 3 weeks since her last visit. She has lost 8 lbs since starting treatment with [place].  Vitamin D deficiency Tonya Nicholson has a new diagnosis of vitamin D deficiency. She is not currently taking vit D and admits fatigue.  Pre-Diabetes Tonya Nicholson has a new diagnosis of pre-diabetes based on her elevated Hgb A1c and was informed this puts her at greater risk of developing diabetes. She is not taking metformin currently and continues to work on diet and exercise to decrease risk of diabetes.   At risk for diabetes Tonya Nicholson is at higher than average risk for developing diabetes due to her pre-diabetes and obesity. She currently denies polyuria or polydipsia.  Atrial Fibrillation Tonya Nicholson had new onset atrial fibrillation discovered at her last visit. She works for [employer] and was able to see a cardiologist the same day. She had an echocardiogram and was started on Xarelto and metoprolol. She has a cardioversion scheduled for this Monday.  ASSESSMENT AND PLAN:  Vitamin D deficiency - Plan: Vitamin D, Ergocalciferol, (DRISDOL) 1.25 MG (50000 UT) CAPS capsule  Prediabetes  Atrial fibrillation, unspecified type ([place])  At risk for diabetes mellitus  Class 2 severe obesity with serious comorbidity and body mass index (BMI) of 39.0 to 39.9 in adult, unspecified obesity type ([place])  PLAN:  Vitamin D Deficiency Tonya Nicholson was informed that low vitamin D levels  contributes to fatigue and are associated with obesity, breast, and colon cancer. [place] agrees to continue to take prescription Vit D @50 ,000 IU every week #4 with no refills and will follow up for routine testing of vitamin D, at least 2-3 times per year. She was informed of the risk of over-replacement of vitamin D and agrees to not increase her dose unless she discusses this with [place] first. We will check labs in 3 months and Etana agrees to follow up in 2 to 3 weeks as directed.  Pre-Diabetes Tonya Nicholson will continue to work on weight loss, exercise, and decreasing simple carbohydrates in her diet to help decrease the risk of diabetes. We discussed metformin including benefits and risks. She was informed that eating too many simple carbohydrates or too many calories at one sitting increases the likelihood of GI side effects. Vadis deferred metformin and agreed to continue her diet and exercise. We will recheck labs in 3 months and Tonya Nicholson agreed to follow up with [place] as directed to monitor her progress.  Diabetes risk counseling Tonya Nicholson was given extended (30 minutes) diabetes prevention counseling today. She is 58 y.o. female and has risk factors for diabetes including pre-diabetes and obesity. We discussed intensive lifestyle modifications today with an emphasis on weight loss as well as increasing exercise and decreasing simple carbohydrates in her diet.  Atrial Fibrillation  Tonya Nicholson agrees to continue with her diet and medications. She was advised that weight loss could improve atrial fibrillation without future exacerbations.  Obesity Tonya Nicholson is currently in the action stage of change. As such, her goal is to continue with weight loss efforts. She has agreed to follow the Category 2 plan. Tonya Nicholson has been instructed to work up to a goal of 150 minutes of combined cardio and strengthening exercise per week for weight loss and overall health benefits. We discussed the following Behavioral Modification  Strategies today: increasing lean protein intake, decreasing simple carbohydrates, increasing vegetables, increase H2O intake, work on meal planning and easy cooking plans, and celebration eating strategies.   Tonya Nicholson has agreed to follow up with our clinic in 2 to 3 weeks. She was informed of the importance of frequent follow up visits to maximize her success with intensive lifestyle modifications for her multiple health conditions.  ALLERGIES: No Known Allergies  MEDICATIONS: Current Outpatient Medications on File Prior to Visit  Medication Sig Dispense Refill  . amLODipine-benazepril (LOTREL) 5-40 MG per capsule TAKE 1 CAPSULE BY MOUTH DAILY. 90 capsule 0  . apixaban (ELIQUIS) 5 MG TABS tablet Take 1 tablet (5 mg total) by mouth 2 (two) times daily. 60 tablet 3  . ibuprofen (ADVIL,MOTRIN) 200 MG tablet Take 600 mg by mouth every 8 (eight) hours as needed (for pain.).    Marland Kitchen metoprolol tartrate (LOPRESSOR) 25 MG tablet Take 1 tablet (25 mg total) by mouth 2 (two) times daily. 60 tablet 3   No current facility-administered medications on file prior to visit.     PAST MEDICAL HISTORY: Past Medical History:  Diagnosis Date  . Dyspnea   . GERD (gastroesophageal reflux disease)   . Hypertension   . Joint pain   . Knee pain   . Lower extremity edema     PAST SURGICAL HISTORY: History reviewed. No pertinent surgical history.  SOCIAL HISTORY: Social History   Tobacco Use  . Smoking status: Never Smoker  . Smokeless tobacco: Never Used  Substance Use Topics  . Alcohol use: Yes    Alcohol/week: 9.0 standard drinks    Types: 7 Standard drinks or equivalent, 2 Shots of liquor per week  . Drug use: No    FAMILY HISTORY: Family History  Problem Relation Age of Onset  . Diabetes Mother   . Hypertension Mother   . Atrial fibrillation Mother   . Diabetes Father   . Hypertension Father   . Heart disease Father        multiple PCI & CABGx3  . Bladder Cancer Father   . Healthy  Sister   . Diabetes Brother   . Heart attack Brother        died in May 13, 2016    ROS: Review of Systems  Constitutional: Positive for malaise/fatigue and weight loss.  Genitourinary:       Negative for polyuria.  Endo/Heme/Allergies: Negative for polydipsia.    PHYSICAL EXAM: Blood pressure (!) 94/57, pulse 72, height 5\' 7"  (1.702 m), weight 255 lb (115.7 kg), last menstrual period 04/09/2016, SpO2 97 %. Body mass index is 39.94 kg/m. Physical Exam Vitals signs reviewed.  Constitutional:      Appearance: Normal appearance. She is obese.  Cardiovascular:     Rate and Rhythm: Normal rate.  Pulmonary:     Effort: Pulmonary effort is normal.  Musculoskeletal: Normal range of motion.  Skin:    General: Skin is warm and dry.  Neurological:     Mental Status: She is alert and oriented to person, place, and time.  Psychiatric:        Mood and Affect: Mood  normal.        Behavior: Behavior normal.     RECENT LABS AND TESTS: BMET    Component Value Date/Time   NA 141 02/24/2018 1508   K 4.0 02/24/2018 1508   CL 102 02/24/2018 1508   CO2 21 02/24/2018 1508   GLUCOSE 105 (H) 02/24/2018 1508   BUN 12 02/24/2018 1508   CREATININE 0.86 02/24/2018 1508   CALCIUM 9.2 02/24/2018 1508   GFRNONAA 75 02/24/2018 1508   GFRAA 87 02/24/2018 1508   Lab Results  Component Value Date   HGBA1C 6.2 (H) 02/24/2018   HGBA1C 6.4 (H) 05/08/2016   Lab Results  Component Value Date   INSULIN 10.7 02/24/2018   CBC    Component Value Date/Time   WBC 7.5 02/24/2018 1508   RBC 4.85 02/24/2018 1508   HGB 14.5 02/24/2018 1508   HCT 43.7 02/24/2018 1508   PLT 273 05/08/2016 0829   MCV 90 02/24/2018 1508   MCH 29.9 02/24/2018 1508   MCHC 33.2 02/24/2018 1508   RDW 12.1 02/24/2018 1508   LYMPHSABS 1.7 02/24/2018 1508   EOSABS 0.1 02/24/2018 1508   BASOSABS 0.1 02/24/2018 1508   Iron/TIBC/Ferritin/ %Sat No results found for: IRON, TIBC, FERRITIN, IRONPCTSAT Lipid Panel     Component  Value Date/Time   CHOL 143 02/24/2018 1508   TRIG 100 02/24/2018 1508   HDL 52 02/24/2018 1508   CHOLHDL 2.8 05/08/2016 0829   LDLCALC 71 02/24/2018 1508   Hepatic Function Panel     Component Value Date/Time   PROT 6.6 02/24/2018 1508   ALBUMIN 4.2 02/24/2018 1508   AST 15 02/24/2018 1508   ALT 15 02/24/2018 1508   ALKPHOS 84 02/24/2018 1508   BILITOT 0.6 02/24/2018 1508      Component Value Date/Time   TSH 1.410 02/24/2018 1508   TSH 1.020 05/08/2016 0829   Results for Tonya, Nicholson (MRN [id number]) as of 03/17/2018 10:51  Ref. Range 02/24/2018 15:08  Vitamin D, 25-Hydroxy Latest Ref Range: 30.0 - 100.0 ng/mL 20.7 (L)   OBESITY BEHAVIORAL INTERVENTION VISIT  Today's visit was # 2   Starting weight: 263 lbs Starting date: 02/24/18 Today's weight : Weight: 255 lb (115.7 kg)  Today's date: 03/17/2018 Total lbs lost to date: 8      Most Recent Value 03/23/2013 - 03/22/2018  Height 5\' 7"  (1.702 m) 03/21/2018  Weight 260 lb (117.9 kg) 03/21/2018  BMI (Calculated) 40.71 03/21/2018  BLOOD PRESSURE - SYSTOLIC 99 06/13/8414  BLOOD PRESSURE - DIASTOLIC 65 07/02/3014  Waist Measurement  49 inches 02/24/2018   Body Fat % 50.6 % 03/17/2018  Total Body Water (lbs) 92.2 lbs 03/17/2018  RMR 1491 02/24/2018   ASK: We discussed the diagnosis of obesity with Larna Daughters today and Alexzandrea agreed to give [place] permission to discuss obesity behavioral modification therapy today.  ASSESS: Reniya has the diagnosis of obesity and her BMI today is 39.9. Feliza is in the action stage of change.   ADVISE: Carita was educated on the multiple health risks of obesity as well as the benefit of weight loss to improve her health. She was advised of the need for long term treatment and the importance of lifestyle modifications to improve her current health and to decrease her risk of future health problems.  AGREE: Multiple dietary modification options and treatment options were discussed and Denice  agreed to follow the recommendations documented in the above note.  ARRANGE: Jaselle was educated on the importance of  frequent visits to treat obesity as outlined per CMS and USPSTF guidelines and agreed to schedule her next follow up appointment today.  IMarcille Blanco, CMA, am acting as transcriptionist for Starlyn Skeans, MD  I have reviewed the above documentation for accuracy and completeness, and I agree with the above. -Dennard Nip, MD

## 2018-03-21 ENCOUNTER — Encounter (HOSPITAL_COMMUNITY): Payer: Self-pay

## 2018-03-21 ENCOUNTER — Ambulatory Visit (HOSPITAL_COMMUNITY): Payer: 59 | Admitting: Anesthesiology

## 2018-03-21 ENCOUNTER — Ambulatory Visit (HOSPITAL_COMMUNITY)
Admission: RE | Admit: 2018-03-21 | Discharge: 2018-03-21 | Disposition: A | Discharge status: Home / Self Care | Payer: 59 | Attending: Cardiology | Admitting: Cardiology

## 2018-03-21 ENCOUNTER — Other Ambulatory Visit: Payer: Self-pay

## 2018-03-21 ENCOUNTER — Encounter (HOSPITAL_COMMUNITY)
Admission: RE | Disposition: A | Discharge status: Home / Self Care | Payer: Self-pay | Source: Home / Self Care | Attending: Cardiology

## 2018-03-21 DIAGNOSIS — I480 Paroxysmal atrial fibrillation: Secondary | ICD-10-CM

## 2018-03-21 DIAGNOSIS — R0683 Snoring: Secondary | ICD-10-CM | POA: Diagnosis not present

## 2018-03-21 DIAGNOSIS — Z79899 Other long term (current) drug therapy: Secondary | ICD-10-CM | POA: Diagnosis not present

## 2018-03-21 DIAGNOSIS — R400 Somnolence: Secondary | ICD-10-CM | POA: Insufficient documentation

## 2018-03-21 DIAGNOSIS — I4891 Unspecified atrial fibrillation: Secondary | ICD-10-CM | POA: Insufficient documentation

## 2018-03-21 DIAGNOSIS — Z6841 Body Mass Index (BMI) 40.0 and over, adult: Secondary | ICD-10-CM | POA: Insufficient documentation

## 2018-03-21 DIAGNOSIS — I10 Essential (primary) hypertension: Secondary | ICD-10-CM | POA: Insufficient documentation

## 2018-03-21 DIAGNOSIS — K219 Gastro-esophageal reflux disease without esophagitis: Secondary | ICD-10-CM | POA: Diagnosis not present

## 2018-03-21 DIAGNOSIS — Z7901 Long term (current) use of anticoagulants: Secondary | ICD-10-CM | POA: Diagnosis not present

## 2018-03-21 HISTORY — PX: CARDIOVERSION: SHX1299

## 2018-03-21 LAB — POCT I-STAT 4, (NA,K, GLUC, HGB,HCT)
Glucose, Bld: 121 mg/dL — ABNORMAL HIGH (ref 70–99)
HEMATOCRIT: 41 % (ref 36.0–46.0)
Hemoglobin: 13.9 g/dL (ref 12.0–15.0)
Potassium: 4 mmol/L (ref 3.5–5.1)
Sodium: 141 mmol/L (ref 135–145)

## 2018-03-21 SURGERY — CARDIOVERSION
Anesthesia: General

## 2018-03-21 MED ORDER — SODIUM CHLORIDE 0.9 % IV SOLN
INTRAVENOUS | Status: DC | PRN
  Administered 2018-03-21: 09:00:00 via INTRAVENOUS

## 2018-03-21 MED ORDER — PROPOFOL 10 MG/ML IV BOLUS
INTRAVENOUS | Status: DC | PRN
  Administered 2018-03-21: 20 mg via INTRAVENOUS
  Administered 2018-03-21: 10 mg via INTRAVENOUS
  Administered 2018-03-21: 50 mg via INTRAVENOUS
  Administered 2018-03-21: 20 mg via INTRAVENOUS

## 2018-03-21 NOTE — CV Procedure (Signed)
   Electrical Cardioversion Procedure Note KERIA WIDRIG [id number] Oct 27, 1960  Procedure: Electrical Cardioversion Indications:  Atrial Fibrillation  Time Out: Verified patient identification, verified procedure,medications/allergies/relevent history reviewed, required imaging and test results available.  Performed  Procedure Details  The patient was NPO after midnight. Anesthesia was administered at the beside  by Dr.Moser with 100mg  of propofol.  Cardioversion was done with synchronized biphasic defibrillation with AP pads with 150watts.  The patient failed to convert to normal sinus rhythm. Cardioversion was done with synchronized biphasic defibrillation with AP pads with 200watts.  The patient failed to convert to normal sinus rhythm. A 3rd attempt at cardioversion was done with synchronized biphasic defibrillation with AP pads with 200 watts which failed to convert to NSR.  The patient tolerated the procedure well   IMPRESSION:  Unsuccessful cardioversion of atrial fibrillation Discussed with Dr. Curt Bears and he will discuss AAD therapy with patient and set up follpwup.   Fransico Him 03/21/2018, 8:34 AM

## 2018-03-21 NOTE — Anesthesia Preprocedure Evaluation (Addendum)
Anesthesia Evaluation  Patient identified by MRN, date of birth, ID band Patient awake    Reviewed: Allergy & Precautions, NPO status , Patient's Chart, lab work & pertinent test results  History of Anesthesia Complications Negative for: history of anesthetic complications  Airway Mallampati: II  TM Distance: >3 FB Neck ROM: Full    Dental  (+) Teeth Intact, Dental Advisory Given   Pulmonary shortness of breath,    breath sounds clear to auscultation       Cardiovascular hypertension, Pt. on home beta blockers and Pt. on medications + dysrhythmias Atrial Fibrillation  Rhythm:Irregular     Neuro/Psych negative neurological ROS     GI/Hepatic Neg liver ROS, GERD  ,  Endo/Other  Morbid obesity  Renal/GU      Musculoskeletal   Abdominal   Peds  Hematology   Anesthesia Other Findings   Reproductive/Obstetrics                            Anesthesia Physical Anesthesia Plan  ASA: II  Anesthesia Plan: General   Post-op Pain Management:    Induction: Intravenous  PONV Risk Score and Plan:   Airway Management Planned: Mask and Natural Airway  Additional Equipment: None  Intra-op Plan:   Post-operative Plan:   Informed Consent: I have reviewed the patients History and Physical, chart, labs and discussed the procedure including the risks, benefits and alternatives for the proposed anesthesia with the patient or authorized representative who has indicated his/her understanding and acceptance.     Dental advisory given  Plan Discussed with: CRNA and Anesthesiologist  Anesthesia Plan Comments:        Anesthesia Quick Evaluation

## 2018-03-21 NOTE — Discharge Instructions (Signed)
Electrical Cardioversion, Care After °This sheet gives you information about how to care for yourself after your procedure. Your health care provider may also give you more specific instructions. If you have problems or questions, contact your health care provider. °What can I expect after the procedure? °After the procedure, it is common to have: °· Some redness on the skin where the shocks were given. °Follow these instructions at home: ° °· Do not drive for 24 hours if you were given a medicine to help you relax (sedative). °· Take over-the-counter and prescription medicines only as told by your health care provider. °· Ask your health care provider how to check your pulse. Check it often. °· Rest for 48 hours after the procedure or as told by your health care provider. °· Avoid or limit your caffeine use as told by your health care provider. °Contact a health care provider if: °· You feel like your heart is beating too quickly or your pulse is not regular. °· You have a serious muscle cramp that does not go away. °Get help right away if: ° °· You have discomfort in your chest. °· You are dizzy or you feel faint. °· You have trouble breathing or you are short of breath. °· Your speech is slurred. °· You have trouble moving an arm or leg on one side of your body. °· Your fingers or toes turn cold or blue. °This information is not intended to replace advice given to you by your health care provider. Make sure you discuss any questions you have with your health care provider. °Document Released: 11/02/2012 Document Revised: 08/16/2015 Document Reviewed: 07/19/2015 °Elsevier Interactive Patient Education © 2019 [employer] ° °

## 2018-03-21 NOTE — Interval H&P Note (Signed)
History and Physical Interval Note:  03/21/2018 8:33 AM  Tonya Nicholson  has presented today for surgery, with the diagnosis of a fib  The various methods of treatment have been discussed with the patient and family. After consideration of risks, benefits and other options for treatment, the patient has consented to  Procedure(s): CARDIOVERSION (N/A) as a surgical intervention .  The patient's history has been reviewed, patient examined, no change in status, stable for surgery.  I have reviewed the patient's chart and labs.  Questions were answered to the patient's satisfaction.     Fransico Him

## 2018-03-21 NOTE — Anesthesia Procedure Notes (Signed)
Procedure Name: General with mask airway Date/Time: 03/21/2018 9:10 AM Performed by: Kyung Rudd, CRNA Pre-anesthesia Checklist: Patient identified, Emergency Drugs available, Suction available, Patient being monitored and Timeout performed Patient Re-evaluated:Patient Re-evaluated prior to induction Oxygen Delivery Method: Circle system utilized Preoxygenation: Pre-oxygenation with 100% oxygen Induction Type: IV induction Ventilation: Mask ventilation without difficulty Dental Injury: Teeth and Oropharynx as per pre-operative assessment

## 2018-03-21 NOTE — Transfer of Care (Signed)
Immediate Anesthesia Transfer of Care Note  Patient: Tonya Nicholson  Procedure(s) Performed: CARDIOVERSION (N/A )  Patient Location: Endoscopy Unit  Anesthesia Type:General  Level of Consciousness: awake, alert  and oriented  Airway & Oxygen Therapy: Patient Spontanous Breathing and Patient connected to nasal cannula oxygen  Post-op Assessment: Report given to RN, Post -op Vital signs reviewed and stable and Patient moving all extremities X 4  Post vital signs: Reviewed and stable  Last Vitals:  Vitals Value Taken Time  BP 114/76 03/21/2018  9:20 AM  Temp    Pulse 85 03/21/2018  9:20 AM  Resp 14 03/21/2018  9:20 AM  SpO2 100 % 03/21/2018  9:20 AM  Vitals shown include unvalidated device data.  Last Pain:  Vitals:   03/21/18 0920  TempSrc: Oral  PainSc: 0-No pain         Complications: No apparent anesthesia complications

## 2018-03-22 NOTE — Anesthesia Postprocedure Evaluation (Signed)
Anesthesia Post Note  Patient: Tonya Nicholson  Procedure(s) Performed: CARDIOVERSION (N/A )     Patient location during evaluation: Endoscopy Anesthesia Type: General Level of consciousness: awake and alert Pain management: pain level controlled Vital Signs Assessment: post-procedure vital signs reviewed and stable Respiratory status: spontaneous breathing, nonlabored ventilation, respiratory function stable and patient connected to nasal cannula oxygen Cardiovascular status: stable Postop Assessment: no apparent nausea or vomiting Anesthetic complications: no    Last Vitals:  Vitals:   03/21/18 0945 03/21/18 0955  BP: 94/67 99/65  Pulse: 71 67  Resp: 12 12  Temp:    SpO2: 98% 98%    Last Pain:  Vitals:   03/22/18 1453  TempSrc:   PainSc: 0-No pain                 Jerrik Housholder

## 2018-03-23 ENCOUNTER — Telehealth: Payer: Self-pay | Admitting: Cardiology

## 2018-03-23 MED FILL — METOPROLOL TARTRATE 25 MG T: 25 | 30 days supply | Qty: 60 | Fill #1

## 2018-03-23 NOTE — Telephone Encounter (Signed)
Per Dr. Curt Bears - schedule pt for DCCV in EP lab w/ him

## 2018-03-23 NOTE — Telephone Encounter (Signed)
° ° °  New message    Patient wants to know if Dr Curt Bears is going to prescribe additional medication for her, since this cardioversion was unsuccessful.

## 2018-03-23 NOTE — Telephone Encounter (Signed)
Forwarding to [facility] for review

## 2018-03-23 NOTE — Telephone Encounter (Signed)
Informed pt that Dr. Curt Bears is going review her chart further before scheduling DCCV w/ him in EP lab. Pt understands I will update her by Friday w/ recommendation/s. Pt agreeable to plan.

## 2018-03-25 NOTE — Telephone Encounter (Signed)
Pt agreeable to DCCV by Camnitz in endo on 3/5 Pt aware I will arrange and she and I will f/u on Monday to discuss further.

## 2018-03-28 NOTE — Telephone Encounter (Signed)
DCCV instructions reviewed w/ pt. She is aware to arrive at 1:00 pm on 3/5 for DCCV w/ Camnitz.  NPO after 6am Patient verbalized understanding and agreeable to plan.

## 2018-03-31 ENCOUNTER — Ambulatory Visit (INDEPENDENT_AMBULATORY_CARE_PROVIDER_SITE_OTHER): Payer: 59 | Admitting: Family Medicine

## 2018-03-31 ENCOUNTER — Ambulatory Visit (HOSPITAL_COMMUNITY): Payer: 59 | Admitting: Anesthesiology

## 2018-03-31 ENCOUNTER — Encounter (HOSPITAL_COMMUNITY)
Admission: RE | Disposition: A | Discharge status: Home / Self Care | Payer: Self-pay | Source: Home / Self Care | Attending: Cardiology

## 2018-03-31 ENCOUNTER — Ambulatory Visit (HOSPITAL_COMMUNITY)
Admission: RE | Admit: 2018-03-31 | Discharge: 2018-03-31 | Disposition: A | Discharge status: Home / Self Care | Payer: 59 | Attending: Cardiology | Admitting: Cardiology

## 2018-03-31 ENCOUNTER — Encounter (INDEPENDENT_AMBULATORY_CARE_PROVIDER_SITE_OTHER): Payer: Self-pay | Admitting: Family Medicine

## 2018-03-31 ENCOUNTER — Encounter (HOSPITAL_COMMUNITY): Payer: Self-pay | Admitting: *Deleted

## 2018-03-31 VITALS — BP 104/69 | HR 78 | Ht 67.0 in | Wt 252.0 lb

## 2018-03-31 DIAGNOSIS — K219 Gastro-esophageal reflux disease without esophagitis: Secondary | ICD-10-CM | POA: Diagnosis not present

## 2018-03-31 DIAGNOSIS — Z833 Family history of diabetes mellitus: Secondary | ICD-10-CM | POA: Insufficient documentation

## 2018-03-31 DIAGNOSIS — Z79899 Other long term (current) drug therapy: Secondary | ICD-10-CM | POA: Diagnosis not present

## 2018-03-31 DIAGNOSIS — I10 Essential (primary) hypertension: Secondary | ICD-10-CM | POA: Insufficient documentation

## 2018-03-31 DIAGNOSIS — Z7901 Long term (current) use of anticoagulants: Secondary | ICD-10-CM | POA: Diagnosis not present

## 2018-03-31 DIAGNOSIS — R0683 Snoring: Secondary | ICD-10-CM | POA: Diagnosis not present

## 2018-03-31 DIAGNOSIS — Z8249 Family history of ischemic heart disease and other diseases of the circulatory system: Secondary | ICD-10-CM | POA: Insufficient documentation

## 2018-03-31 DIAGNOSIS — Z6839 Body mass index (BMI) 39.0-39.9, adult: Secondary | ICD-10-CM

## 2018-03-31 DIAGNOSIS — E119 Type 2 diabetes mellitus without complications: Secondary | ICD-10-CM | POA: Diagnosis not present

## 2018-03-31 DIAGNOSIS — R5383 Other fatigue: Secondary | ICD-10-CM | POA: Insufficient documentation

## 2018-03-31 DIAGNOSIS — R600 Localized edema: Secondary | ICD-10-CM | POA: Insufficient documentation

## 2018-03-31 DIAGNOSIS — I4819 Other persistent atrial fibrillation: Secondary | ICD-10-CM | POA: Diagnosis not present

## 2018-03-31 DIAGNOSIS — I4891 Unspecified atrial fibrillation: Secondary | ICD-10-CM | POA: Diagnosis not present

## 2018-03-31 HISTORY — PX: CARDIOVERSION: SHX1299

## 2018-03-31 SURGERY — CARDIOVERSION
Anesthesia: General

## 2018-03-31 MED ORDER — PROPOFOL 10 MG/ML IV BOLUS
INTRAVENOUS | Status: DC | PRN
  Administered 2018-03-31: 60 mg via INTRAVENOUS

## 2018-03-31 MED ORDER — SODIUM CHLORIDE 0.9 % IV SOLN
INTRAVENOUS | Status: DC | PRN
  Administered 2018-03-31: 13:00:00 via INTRAVENOUS

## 2018-03-31 MED ORDER — LIDOCAINE HCL (CARDIAC) PF 100 MG/5ML IV SOSY
PREFILLED_SYRINGE | INTRAVENOUS | Status: DC | PRN
  Administered 2018-03-31: 40 mg via INTRAVENOUS

## 2018-03-31 NOTE — Transfer of Care (Signed)
Immediate Anesthesia Transfer of Care Note  Patient: Tonya Nicholson  Procedure(s) Performed: CARDIOVERSION (N/A )  Patient Location: Endoscopy Unit  Anesthesia Type:General  Level of Consciousness: awake, alert  and oriented  Airway & Oxygen Therapy: Patient Spontanous Breathing  Post-op Assessment: Report given to RN, Post -op Vital signs reviewed and stable and Patient moving all extremities X 4  Post vital signs: Reviewed and stable  Last Vitals:  Vitals Value Taken Time  BP    Temp    Pulse    Resp    SpO2      Last Pain:  Vitals:   03/31/18 1246  TempSrc: Oral  PainSc: 0-No pain         Complications: No apparent anesthesia complications

## 2018-03-31 NOTE — Discharge Instructions (Signed)
Electrical Cardioversion, Care After °This sheet gives you information about how to care for yourself after your procedure. Your health care provider may also give you more specific instructions. If you have problems or questions, contact your health care provider. °What can I expect after the procedure? °After the procedure, it is common to have: °· Some redness on the skin where the shocks were given. °Follow these instructions at home: ° °· Do not drive for 24 hours if you were given a medicine to help you relax (sedative). °· Take over-the-counter and prescription medicines only as told by your health care provider. °· Ask your health care provider how to check your pulse. Check it often. °· Rest for 48 hours after the procedure or as told by your health care provider. °· Avoid or limit your caffeine use as told by your health care provider. °Contact a health care provider if: °· You feel like your heart is beating too quickly or your pulse is not regular. °· You have a serious muscle cramp that does not go away. °Get help right away if: ° °· You have discomfort in your chest. °· You are dizzy or you feel faint. °· You have trouble breathing or you are short of breath. °· Your speech is slurred. °· You have trouble moving an arm or leg on one side of your body. °· Your fingers or toes turn cold or blue. °This information is not intended to replace advice given to you by your health care provider. Make sure you discuss any questions you have with your health care provider. °Document Released: 11/02/2012 Document Revised: 08/16/2015 Document Reviewed: 07/19/2015 °Elsevier Interactive Patient Education © 2019 [employer] ° °

## 2018-03-31 NOTE — Progress Notes (Signed)
Office: [phone]  /  Fax: [phone]   HPI:   Chief Complaint: OBESITY Tonya Nicholson is here to discuss her progress with her obesity treatment plan. She is on the Category 2 plan and is following her eating plan approximately 75% of the time. She states she is exercising 0 minutes 0 times per week. Tonya Nicholson continues to do well with weight loss and states her hunger is controlled. She is doing well with meal planning and prepping. She states she does not feel deprived with her food choices. Her weight is 252 lb (114.3 kg) today and has had a weight loss of 3 pounds over a period of 2 weeks since her last visit. She has lost 11 lbs since starting treatment with [place].  Hypertension Tonya Nicholson is a 58 y.o. female with hypertension and is on metoprolol for atrial fibrillation and will be having her 2nd cardio ablation today. She is fasting for this and feels a bit dehydrated. Tonya Nicholson denies chest pain or palpitations. She is working on weight loss to help control her blood pressure with the goal of decreasing her risk of heart attack and stroke. Tonya Nicholson's blood pressure is low today.   ASSESSMENT AND PLAN:  Essential hypertension  Class 2 severe obesity with serious comorbidity and body mass index (BMI) of 39.0 to 39.9 in adult, unspecified obesity type ([place])  PLAN:  Hypertension We discussed sodium restriction, working on healthy weight loss, and a regular exercise program as the means to achieve improved blood pressure control. [name] agreed with this plan and agreed to follow up as directed. We will continue to monitor her blood pressure as well as her progress with the above lifestyle modifications. She will continue her diet, increase her H20 intake after her cardiac procedure today, and will watch for signs of hypotension as she continues her lifestyle modifications. She was advised that we will recheck her blood pressure in 3 weeks.  I spent > than 50% of the 15 minute visit on  counseling as documented in the note.  Obesity Tonya Nicholson is currently in the action stage of change. As such, her goal is to continue with weight loss efforts. She has agreed to follow the Category 2 plan. Tonya Nicholson has been instructed to work up to a goal of 150 minutes of combined cardio and strengthening exercise per week for weight loss and overall health benefits. We discussed the following Behavioral Modification Strategies today: increasing lean protein intake, decreasing simple carbohydrates, and work on meal planning and easy cooking plans  Tonya Nicholson has agreed to follow-up with our clinic in 3 weeks. She was informed of the importance of frequent follow up visits to maximize her success with intensive lifestyle modifications for her multiple health conditions.  ALLERGIES: No Known Allergies  MEDICATIONS: Current Outpatient Medications on File Prior to Visit  Medication Sig Dispense Refill  . amLODipine-benazepril (LOTREL) 5-40 MG per capsule TAKE 1 CAPSULE BY MOUTH DAILY. 90 capsule 0  . apixaban (ELIQUIS) 5 MG TABS tablet Take 1 tablet (5 mg total) by mouth 2 (two) times daily. 60 tablet 3  . metoprolol tartrate (LOPRESSOR) 25 MG tablet Take 1 tablet (25 mg total) by mouth 2 (two) times daily. 60 tablet 3  . Vitamin D, Ergocalciferol, (DRISDOL) 1.25 MG (50000 UT) CAPS capsule Take 1 capsule (50,000 Units total) by mouth every 7 (seven) days. (Patient taking differently: Take 50,000 Units by mouth every Sunday. ) 4 capsule 0   No current facility-administered medications on file prior  to visit.     PAST MEDICAL HISTORY: Past Medical History:  Diagnosis Date  . Dyspnea   . GERD (gastroesophageal reflux disease)   . Hypertension   . Joint pain   . Knee pain   . Lower extremity edema     PAST SURGICAL HISTORY: Past Surgical History:  Procedure Laterality Date  . CARDIOVERSION N/A 03/21/2018   Procedure: CARDIOVERSION;  Surgeon: Sueanne Margarita, MD;  Location: [facility] ENDOSCOPY;  Service:  Cardiovascular;  Laterality: N/A;    SOCIAL HISTORY: Social History   Tobacco Use  . Smoking status: Never Smoker  . Smokeless tobacco: Never Used  Substance Use Topics  . Alcohol use: Yes    Alcohol/week: 9.0 standard drinks    Types: 7 Standard drinks or equivalent, 2 Shots of liquor per week  . Drug use: No    FAMILY HISTORY: Family History  Problem Relation Age of Onset  . Diabetes Mother   . Hypertension Mother   . Atrial fibrillation Mother   . Diabetes Father   . Hypertension Father   . Heart disease Father        multiple PCI & CABGx3  . Bladder Cancer Father   . Healthy Sister   . Diabetes Brother   . Heart attack Brother        died in 2016/05/11   ROS: Review of Systems  Constitutional: Positive for weight loss.   PHYSICAL EXAM: Blood pressure 104/69, pulse 78, height 5\' 7"  (1.702 m), weight 252 lb (114.3 kg), last menstrual period 04/09/2016, SpO2 97 %. Body mass index is 39.47 kg/m. Physical Exam Vitals signs reviewed.  Constitutional:      Appearance: Normal appearance. She is obese.  Cardiovascular:     Rate and Rhythm: Normal rate.     Pulses: Normal pulses.  Pulmonary:     Effort: Pulmonary effort is normal.     Breath sounds: Normal breath sounds.  Musculoskeletal: Normal range of motion.  Skin:    General: Skin is warm and dry.  Neurological:     Mental Status: She is alert and oriented to person, place, and time.  Psychiatric:        Behavior: Behavior normal.   RECENT LABS AND TESTS: BMET    Component Value Date/Time   NA 141 03/21/2018 0853   NA 141 02/24/2018 1508   K 4.0 03/21/2018 0853   CL 102 02/24/2018 1508   CO2 21 02/24/2018 1508   GLUCOSE 121 (H) 03/21/2018 0853   BUN 12 02/24/2018 1508   CREATININE 0.86 02/24/2018 1508   CALCIUM 9.2 02/24/2018 1508   GFRNONAA 75 02/24/2018 1508   GFRAA 87 02/24/2018 1508   Lab Results  Component Value Date   HGBA1C 6.2 (H) 02/24/2018   HGBA1C 6.4 (H) 05/08/2016   Lab Results    Component Value Date   INSULIN 10.7 02/24/2018   CBC    Component Value Date/Time   WBC 7.5 02/24/2018 1508   RBC 4.85 02/24/2018 1508   HGB 13.9 03/21/2018 0853   HGB 14.5 02/24/2018 1508   HCT 41.0 03/21/2018 0853   HCT 43.7 02/24/2018 1508   PLT 273 05/08/2016 0829   MCV 90 02/24/2018 1508   MCH 29.9 02/24/2018 1508   MCHC 33.2 02/24/2018 1508   RDW 12.1 02/24/2018 1508   LYMPHSABS 1.7 02/24/2018 1508   EOSABS 0.1 02/24/2018 1508   BASOSABS 0.1 02/24/2018 1508   Iron/TIBC/Ferritin/ %Sat No results found for: IRON, TIBC, FERRITIN, IRONPCTSAT Lipid Panel  Component Value Date/Time   CHOL 143 02/24/2018 1508   TRIG 100 02/24/2018 1508   HDL 52 02/24/2018 1508   CHOLHDL 2.8 05/08/2016 0829   LDLCALC 71 02/24/2018 1508   Hepatic Function Panel     Component Value Date/Time   PROT 6.6 02/24/2018 1508   ALBUMIN 4.2 02/24/2018 1508   AST 15 02/24/2018 1508   ALT 15 02/24/2018 1508   ALKPHOS 84 02/24/2018 1508   BILITOT 0.6 02/24/2018 1508      Component Value Date/Time   TSH 1.410 02/24/2018 1508   TSH 1.020 05/08/2016 0829    Ref. Range 02/24/2018 15:08  Vitamin D, 25-Hydroxy Latest Ref Range: 30.0 - 100.0 ng/mL 20.7 (L)   OBESITY BEHAVIORAL INTERVENTION VISIT  Today's visit was #3  Starting weight: 263 lbs Starting date: 02/24/2018 Today's weight: 252 lbs Today's date: 03/31/2018 Total lbs lost to date: 11    03/31/2018  Height 5\' 7"  (1.702 m)  Weight 252 lb (114.3 kg)  BMI (Calculated) 39.46  BLOOD PRESSURE - SYSTOLIC 151  BLOOD PRESSURE - DIASTOLIC 69   Body Fat % 76.1 %  Total Body Water (lbs) 92 lbs   ASK: We discussed the diagnosis of obesity with Tonya Nicholson today and Tonya Nicholson agreed to give [place] permission to discuss obesity behavioral modification therapy today.  ASSESS: Tonya Nicholson has the diagnosis of obesity and her BMI today is 39.46. Tonya Nicholson is in the action stage of change.   ADVISE: Tonya Nicholson was educated on the multiple health risks of  obesity as well as the benefit of weight loss to improve her health. She was advised of the need for long term treatment and the importance of lifestyle modifications to improve her current health and to decrease her risk of future health problems.  AGREE: Multiple dietary modification options and treatment options were discussed and  Tonya Nicholson agreed to follow the recommendations documented in the above note.  ARRANGE: Tonya Nicholson was educated on the importance of frequent visits to treat obesity as outlined per CMS and USPSTF guidelines and agreed to schedule her next follow up appointment today.  I, Michaelene Song, am acting as Location manager for Dennard Nip, MD  I have reviewed the above documentation for accuracy and completeness, and I agree with the above. -Dennard Nip, MD

## 2018-03-31 NOTE — H&P (Signed)
Electrophysiology Office Note   Date:  03/31/2018   ID:  Tonya Nicholson, DOB [birth year], MRN [id number]  PCP:  Esaw Grandchild, NP  Cardiologist:   Primary Electrophysiologist:  Sarita Hakanson Meredith Leeds, MD    No chief complaint on file.    History of Present Illness: Tonya Nicholson is a 58 y.o. female who is being seen today for the evaluation of atrial fibrillation at the request of No ref. provider found. Presenting today for electrophysiology evaluation.  She has a history of morbid obesity and hypertension.  She presented to her primary physician's office with fatigue.  She felt that her energy was lower than it should be.  This is worsened with some recent weight gain.  But not worsened recently.  She does have daytime somnolence and admits to waking up feeling tired.  She does snore.  Today, she denies symptoms of palpitations, chest pain, shortness of breath, orthopnea, PND, lower extremity edema, claudication, dizziness, presyncope, syncope, bleeding, or neurologic sequela. The patient is tolerating medications without difficulties.    Past Medical History:  Diagnosis Date  . Dyspnea   . GERD (gastroesophageal reflux disease)   . Hypertension   . Joint pain   . Knee pain   . Lower extremity edema    Past Surgical History:  Procedure Laterality Date  . CARDIOVERSION N/A 03/21/2018   Procedure: CARDIOVERSION;  Surgeon: Sueanne Margarita, MD;  Location: [facility] ENDOSCOPY;  Service: Cardiovascular;  Laterality: N/A;     No current facility-administered medications for this encounter.     Allergies:   Patient has no known allergies.   Social History:  The patient  reports that she has never smoked. She has never used smokeless tobacco. She reports current alcohol use of about 9.0 standard drinks of alcohol per week. She reports that she does not use drugs.   Family History:  The patient's family history includes Atrial fibrillation in her mother; Bladder Cancer in her father;  Diabetes in her brother, father, and mother; Healthy in her sister; Heart attack in her brother; Heart disease in her father; Hypertension in her father and mother.    ROS:  Please see the history of present illness.   Otherwise, review of systems is positive for weak, fatigue.   All other systems are reviewed and negative.    PHYSICAL EXAM: VS:  BP 120/88   Pulse 87   Temp 97.9 F (36.6 C) (Oral)   Resp 16   LMP 04/09/2016 (Approximate)   SpO2 98%  , BMI There is no height or weight on file to calculate BMI. GEN: Well nourished, well developed, in no acute distress  HEENT: normal  Neck: no JVD, carotid bruits, or masses Cardiac: iRRR; no murmurs, rubs, or gallops,no edema  Respiratory:  clear to auscultation bilaterally, normal work of breathing GI: soft, nontender, nondistended, + BS MS: no deformity or atrophy  Skin: warm and dry Neuro:  Strength and sensation are intact Psych: euthymic mood, full affect  EKG:  EKG is not ordered today. Personal review of the ekg ordered 02/24/17 shows atrial fibrillation, rate between 128 and 101  Recent Labs: 02/24/2018: ALT 15; BUN 12; Creatinine, Ser 0.86; TSH 1.410 03/21/2018: Hemoglobin 13.9; Potassium 4.0; Sodium 141    Lipid Panel     Component Value Date/Time   CHOL 143 02/24/2018 1508   TRIG 100 02/24/2018 1508   HDL 52 02/24/2018 1508   CHOLHDL 2.8 05/08/2016 0829   LDLCALC 71 02/24/2018 1508  Wt Readings from Last 3 Encounters:  03/31/18 114.3 kg  03/21/18 117.9 kg  03/17/18 115.7 kg      Other studies Reviewed: Additional studies/ records that were reviewed today include: epic notes  ASSESSMENT AND PLAN:  1.  Atrial fibrillation: Unclear if this is persistent or paroxysmal.  She has had some fatigue for quite some time.  As this is her first episode of atrial fibrillation known, we Emmalyne Giacomo get a transthoracic echo.  We Janalyn Higby also schedule her for a cardioversion after being on Eliquis for 3 weeks.  We Kiani Wurtzel start her  on Eliquis today.  Her heart rates have also been fast at times.  We Abhiram Criado start her on 25 mg of metoprolol today.  This patients CHA2DS2-VASc Score and unadjusted Ischemic Stroke Rate (% per year) is equal to 2.2 % stroke rate/year from a score of 2  Above score calculated as 1 point each if present [CHF, HTN, DM, Vascular=MI/PAD/Aortic Plaque, Age if 65-74, or Female] Above score calculated as 2 points each if present [Age > 75, or Stroke/TIA/TE]  2. Hypertension: Early well-controlled.  No changes.  3.  Snoring and daytime somnolence: She does have morbid obesity.  Keylee Shrestha order a sleep study to evaluate for sleep apnea.  Current medicines are reviewed at length with the patient today.   The patient does not have concerns regarding her medicines.  The following changes were made today: Metoprolol, Eliquis  Labs/ tests ordered today include:  Orders Placed This Encounter  Procedures  . Diet NPO time specified  . Informed consent details: transcribe and obtain patient signature  . Verify informed consent  . If patient is a diabetic, place order for and obtain CBG  . If patient is taking diuretic or labs are older than 2 days, place order for and obtain BMET  . If patient is on Coumadin, place order for and obtain PT-INR and document INR is between 2-3  . If patient is on rivaroxaban [facility]), verify that patient currently has taken 3 consecutive doses  . If patient is on dabigatran (PRADAXA) or apixaban [facility]), verify that patient currently has taken 5 consecutive doses  . Document by rhythm strip atrial fibrillation or atrial flutter.  If patient is in NSR place order for EKG, obtain and call MD  . Shave left side of chest and back if necessary for pad placement  . Remove and safely store all jewelry.  Tape rings in place that cannot be removed.  . Provide equipment / supplies at bedside  . Patient to wear single hospital gown  . Vital signs post cardioversion  . Document [place]  Opioid-Induced Sedation Scale (POSS) per protocol (see sidebar report)  . Vital signs  . Diet instructions to nursing: When alert, patient may resume diet/eating  . If diabetic or glucose greater than 140 mg/dL, notify physician to place Glycemic Control (SSI) Order Set  . Discontinue IV and cover with clean dressing   As discussed with primary physician  Disposition:   FU with Caran Storck 6 weeks  Signed, Jarrett Albor Meredith Leeds, MD  03/31/2018 1:03 PM     [place] [address] [place] [address] [phone] (office) [phone] (fax)  MICHAELYN WALL has presented today for surgery, with the diagnosis of atrial fibrillation.  The various methods of treatment have been discussed with the patient and family. After consideration of risks, benefits and other options for treatment, the patient has consented to  Procedure(s): cardioversion as a  surgical intervention .  Risks include but not limited to arrhythmia, skin irritation, among others. The patient's history has been reviewed, patient examined, no change in status, stable for surgery.  I have reviewed the patient's chart and labs.  Questions were answered to the patient's satisfaction.    Claudetta Sallie Curt Bears, MD 03/31/2018 1:03 PM

## 2018-03-31 NOTE — Anesthesia Postprocedure Evaluation (Signed)
Anesthesia Post Note  Patient: Tonya Nicholson  Procedure(s) Performed: CARDIOVERSION (N/A )     Patient location during evaluation: Endoscopy Anesthesia Type: General Level of consciousness: awake and alert Pain management: pain level controlled Vital Signs Assessment: post-procedure vital signs reviewed and stable Respiratory status: spontaneous breathing, nonlabored ventilation, respiratory function stable and patient connected to nasal cannula oxygen Cardiovascular status: blood pressure returned to baseline and stable Postop Assessment: no apparent nausea or vomiting Anesthetic complications: no    Last Vitals:  Vitals:   03/31/18 1340 03/31/18 1348  BP: (!) 134/53 110/62  Pulse: 64 (!) 55  Resp: 19 10  Temp: 36.5 C   SpO2: 98% 99%    Last Pain:  Vitals:   03/31/18 1348  TempSrc:   PainSc: 0-No pain                 Effie Berkshire

## 2018-03-31 NOTE — Procedures (Signed)
Electrical Cardioversion Procedure Note Tonya Nicholson [id number] 07-Jun-1960  Procedure: Electrical Cardioversion Indications:  Atrial Fibrillation  Procedure Details Consent: Risks of procedure as well as the alternatives and risks of each were explained to the (patient/caregiver).  Consent for procedure obtained. Time Out: Verified patient identification, verified procedure, site/side was marked, verified correct patient position, special equipment/implants available, medications/allergies/relevent history reviewed, required imaging and test results available.  Performed  Patient placed on cardiac monitor, pulse oximetry, supplemental oxygen as necessary.  Sedation given: per anesthesia report Pacer pads placed anterior and posterior chest.  Cardioverted 1 time(s).  Cardioverted at Tonya Nicholson.  Evaluation Findings: Post procedure EKG shows: NSR Complications: None Patient did tolerate procedure well.   Tonya Nicholson 03/31/2018, 1:47 PM

## 2018-03-31 NOTE — Anesthesia Preprocedure Evaluation (Addendum)
Anesthesia Evaluation  Patient identified by MRN, date of birth, ID band Patient awake    Reviewed: Allergy & Precautions, NPO status , Patient's Chart, lab work & pertinent test results  Airway Mallampati: III  TM Distance: >3 FB Neck ROM: Full    Dental  (+) Teeth Intact, Dental Advisory Given   Pulmonary neg pulmonary ROS,    breath sounds clear to auscultation       Cardiovascular hypertension, Pt. on medications and Pt. on home beta blockers + dysrhythmias Atrial Fibrillation  Rhythm:Irregular Rate:Abnormal     Neuro/Psych negative neurological ROS  negative psych ROS   GI/Hepatic Neg liver ROS, GERD  ,  Endo/Other  negative endocrine ROS  Renal/GU negative Renal ROS     Musculoskeletal negative musculoskeletal ROS (+)   Abdominal Normal abdominal exam  (+)   Peds  Hematology negative hematology ROS (+)   Anesthesia Other Findings   Reproductive/Obstetrics                            Echo:  1. The left ventricle has normal systolic function of 46-56%. The cavity size was normal. There is no increased left ventricular wall thickness. Left ventricular diastology could not be evaluated secondary to atrial fibrillation.  2. The right ventricle has normal systolic function. The cavity was normal. There is no increase in right ventricular wall thickness.  3. Left atrial size was mildly dilated.  4. Normal RVSP  5. The inferior vena cava was dilated in size with <50% respiratory variability.  Anesthesia Physical Anesthesia Plan  ASA: III  Anesthesia Plan: General   Post-op Pain Management:    Induction: Intravenous  PONV Risk Score and Plan: 0  Airway Management Planned: Mask  Additional Equipment: None  Intra-op Plan:   Post-operative Plan:   Informed Consent: I have reviewed the patients History and Physical, chart, labs and discussed the procedure including the risks,  benefits and alternatives for the proposed anesthesia with the patient or authorized representative who has indicated his/her understanding and acceptance.       Plan Discussed with: CRNA  Anesthesia Plan Comments:        Anesthesia Quick Evaluation

## 2018-04-01 ENCOUNTER — Encounter (HOSPITAL_COMMUNITY): Payer: Self-pay | Admitting: Cardiology

## 2018-04-14 ENCOUNTER — Encounter: Payer: Self-pay | Admitting: Cardiology

## 2018-04-15 MED FILL — METOPROLOL TARTRATE 25 MG T: 25 | 30 days supply | Qty: 60 | Fill #2 | Status: TO

## 2018-04-15 MED FILL — AMLODIPINE-BENAZEPRIL 5-40: 5-40 | 30 days supply | Qty: 30 | Fill #1 | Status: TO

## 2018-04-15 MED FILL — ELIQUIS 5 MG TABLET: 5 | 30 days supply | Qty: 60 | Fill #1

## 2018-04-21 ENCOUNTER — Encounter (INDEPENDENT_AMBULATORY_CARE_PROVIDER_SITE_OTHER): Payer: Self-pay

## 2018-04-22 ENCOUNTER — Ambulatory Visit: Payer: 59 | Admitting: Cardiology

## 2018-04-28 ENCOUNTER — Ambulatory Visit (INDEPENDENT_AMBULATORY_CARE_PROVIDER_SITE_OTHER): Payer: 59 | Admitting: Family Medicine

## 2018-04-28 ENCOUNTER — Encounter (INDEPENDENT_AMBULATORY_CARE_PROVIDER_SITE_OTHER): Payer: Self-pay | Admitting: Family Medicine

## 2018-04-28 ENCOUNTER — Other Ambulatory Visit: Payer: Self-pay

## 2018-04-28 DIAGNOSIS — E559 Vitamin D deficiency, unspecified: Secondary | ICD-10-CM | POA: Diagnosis not present

## 2018-04-28 DIAGNOSIS — R7303 Prediabetes: Secondary | ICD-10-CM | POA: Diagnosis not present

## 2018-04-28 DIAGNOSIS — Z6839 Body mass index (BMI) 39.0-39.9, adult: Secondary | ICD-10-CM | POA: Diagnosis not present

## 2018-04-28 MED ORDER — VITAMIN D (ERGOCALCIFEROL) 1.25 MG (50000 UNIT) PO CAPS: 50000.0000 [IU] | ORAL_CAPSULE | ORAL | 0 refills | Status: DC

## 2018-04-28 NOTE — Progress Notes (Signed)
Office: [phone]  /  Fax: [phone] TeleHealth Visit:  Tonya Nicholson has verbally consented to this TeleHealth visit today. The patient is located at home, the provider is located at the [employer] [facility] office. The participants in this visit include the listed provider and patient. The visit was conducted today via Face Time.  HPI:   Chief Complaint: OBESITY Tonya Nicholson is here to discuss her progress with her obesity treatment plan. She is on the Category 2 plan and is following her eating plan approximately 20 % of the time. She states she is exercising 0 minutes 0 times per week. Tonya Nicholson is currently struggling to follow the Category 2 plan due to changing work shifts, stress, and grocery store shortages. She is skipping meals more and feeling tired. She thinks that she may have gained 1 to 2 pounds since out last visit.  We were unable to weigh the patient today for this TeleHealth visit. She feels as if she has gained weight since her last visit. She has lost 11 lbs since starting treatment with [place].  Vitamin D Deficiency Tonya Nicholson has a diagnosis of vitamin D deficiency. She is currently stable on vit D, but is not yet at goal. Tonya Nicholson denies nausea, vomiting, or muscle weakness.  Pre-Diabetes Tonya Nicholson has a diagnosis of pre-diabetes based on her elevated Hgb A1c was 6.2 on 02/24/18. She was informed this puts her at greater risk of developing diabetes. She is not taking metformin currently and is working on changing her diet, but has struggled in the last 2 weeks. She states that her hunger is controlled.  ASSESSMENT AND PLAN:  Vitamin D deficiency - Plan: Vitamin D, Ergocalciferol, (DRISDOL) 1.25 MG (50000 UT) CAPS capsule  Prediabetes  Class 2 severe obesity with serious comorbidity and body mass index (BMI) of 39.0 to 39.9 in adult, unspecified obesity type ([place])  PLAN:  Vitamin D Deficiency Tonya Nicholson was informed that low vitamin D levels contribute to fatigue and are  associated with obesity, breast, and colon cancer. Tonya Nicholson agrees to continue to take prescription Vit D @50 ,000 IU every week #4 with no refills and will follow up for routine testing of vitamin D, at least 2-3 times per year. She was informed of the risk of over-replacement of vitamin D and agrees to not increase her dose unless she discusses this with [place] first. Tonya Nicholson agrees to follow up in 2 weeks as directed.  Pre-Diabetes Tonya Nicholson will continue to work on weight loss, exercise, and decreasing simple carbohydrates in her diet to help decrease the risk of diabetes. She was informed that eating too many simple carbohydrates or too many calories at one sitting increases the likelihood of GI side effects. Tonya Nicholson agreed to get back to her diet prescription and may consider metformin if there is no improvement on her next labs. Tonya Nicholson agreed to follow up with [place] as directed to monitor her progress in 2 weeks.   Obesity Tonya Nicholson is currently in the action stage of change. As such, her goal is to continue with weight loss efforts. She has agreed to change to keeping a food journal of 1000 to 1300 calories and 70+ grams of protein daily. Tonya Nicholson has been instructed to work up to a goal of 150 minutes of combined cardio and strengthening exercise per week for weight loss and overall health benefits. We discussed the following Behavioral Modification Strategies today: increasing lean protein intake, work on meal planning and easy cooking plans, no skipping meals, better snacking choices, and  ways to avoid night time snacking.  Tonya Nicholson has agreed to follow up with our clinic in 2 weeks. She was informed of the importance of frequent follow up visits to maximize her success with intensive lifestyle modifications for her multiple health conditions.  ALLERGIES: No Known Allergies  MEDICATIONS: Current Outpatient Medications on File Prior to Visit  Medication Sig Dispense Refill  . amLODipine-benazepril (LOTREL) 5-40 MG  per capsule TAKE 1 CAPSULE BY MOUTH DAILY. 90 capsule 0  . apixaban (ELIQUIS) 5 MG TABS tablet Take 1 tablet (5 mg total) by mouth 2 (two) times daily. 60 tablet 3  . metoprolol tartrate (LOPRESSOR) 25 MG tablet Take 1 tablet (25 mg total) by mouth 2 (two) times daily. 60 tablet 3  . Vitamin D, Ergocalciferol, (DRISDOL) 1.25 MG (50000 UT) CAPS capsule Take 1 capsule (50,000 Units total) by mouth every 7 (seven) days. (Patient taking differently: Take 50,000 Units by mouth every Sunday. ) 4 capsule 0   No current facility-administered medications on file prior to visit.     PAST MEDICAL HISTORY: Past Medical History:  Diagnosis Date  . Dyspnea   . GERD (gastroesophageal reflux disease)   . Hypertension   . Joint pain   . Knee pain   . Lower extremity edema     PAST SURGICAL HISTORY: Past Surgical History:  Procedure Laterality Date  . CARDIOVERSION N/A 03/21/2018   Procedure: CARDIOVERSION;  Surgeon: Sueanne Margarita, MD;  Location: [facility] ENDOSCOPY;  Service: Cardiovascular;  Laterality: N/A;  . CARDIOVERSION N/A 03/31/2018   Procedure: CARDIOVERSION;  Surgeon: Constance Haw, MD;  Location: [place];  Service: Cardiovascular;  Laterality: N/A;    SOCIAL HISTORY: Social History   Tobacco Use  . Smoking status: Never Smoker  . Smokeless tobacco: Never Used  Substance Use Topics  . Alcohol use: Yes    Alcohol/week: 9.0 standard drinks    Types: 7 Standard drinks or equivalent, 2 Shots of liquor per week  . Drug use: No    FAMILY HISTORY: Family History  Problem Relation Age of Onset  . Diabetes Mother   . Hypertension Mother   . Atrial fibrillation Mother   . Diabetes Father   . Hypertension Father   . Heart disease Father        multiple PCI & CABGx3  . Bladder Cancer Father   . Healthy Sister   . Diabetes Brother   . Heart attack Brother        died in 23-Apr-2016    ROS: Review of Systems  Gastrointestinal: Negative for nausea and vomiting.  Musculoskeletal:        Negative for muscle weakness.    PHYSICAL EXAM: Pt in no acute distress  RECENT LABS AND TESTS: BMET    Component Value Date/Time   NA 141 03/21/2018 0853   NA 141 02/24/2018 1508   K 4.0 03/21/2018 0853   CL 102 02/24/2018 1508   CO2 21 02/24/2018 1508   GLUCOSE 121 (H) 03/21/2018 0853   BUN 12 02/24/2018 1508   CREATININE 0.86 02/24/2018 1508   CALCIUM 9.2 02/24/2018 1508   GFRNONAA 75 02/24/2018 1508   GFRAA 87 02/24/2018 1508   Lab Results  Component Value Date   HGBA1C 6.2 (H) 02/24/2018   HGBA1C 6.4 (H) 05/08/2016   Lab Results  Component Value Date   INSULIN 10.7 02/24/2018   CBC    Component Value Date/Time   WBC 7.5 02/24/2018 1508   RBC 4.85 02/24/2018 1508  HGB 13.9 03/21/2018 0853   HGB 14.5 02/24/2018 1508   HCT 41.0 03/21/2018 0853   HCT 43.7 02/24/2018 1508   PLT 273 05/08/2016 0829   MCV 90 02/24/2018 1508   MCH 29.9 02/24/2018 1508   MCHC 33.2 02/24/2018 1508   RDW 12.1 02/24/2018 1508   LYMPHSABS 1.7 02/24/2018 1508   EOSABS 0.1 02/24/2018 1508   BASOSABS 0.1 02/24/2018 1508   Iron/TIBC/Ferritin/ %Sat No results found for: IRON, TIBC, FERRITIN, IRONPCTSAT Lipid Panel     Component Value Date/Time   CHOL 143 02/24/2018 1508   TRIG 100 02/24/2018 1508   HDL 52 02/24/2018 1508   CHOLHDL 2.8 05/08/2016 0829   LDLCALC 71 02/24/2018 1508   Hepatic Function Panel     Component Value Date/Time   PROT 6.6 02/24/2018 1508   ALBUMIN 4.2 02/24/2018 1508   AST 15 02/24/2018 1508   ALT 15 02/24/2018 1508   ALKPHOS 84 02/24/2018 1508   BILITOT 0.6 02/24/2018 1508      Component Value Date/Time   TSH 1.410 02/24/2018 1508   TSH 1.020 05/08/2016 0829   Results for SALEENA, TAMAS (MRN [id number]) as of 04/28/2018 10:04  Ref. Range 02/24/2018 15:08  Vitamin D, 25-Hydroxy Latest Ref Range: 30.0 - 100.0 ng/mL 20.7 (L)     I, Marcille Blanco, CMA, am acting as transcriptionist for Starlyn Skeans, MD I have reviewed the above  documentation for accuracy and completeness, and I agree with the above. -Dennard Nip, MD

## 2018-05-12 ENCOUNTER — Ambulatory Visit (INDEPENDENT_AMBULATORY_CARE_PROVIDER_SITE_OTHER): Payer: 59 | Admitting: Family Medicine

## 2018-05-12 ENCOUNTER — Other Ambulatory Visit: Payer: Self-pay

## 2018-05-12 ENCOUNTER — Encounter (INDEPENDENT_AMBULATORY_CARE_PROVIDER_SITE_OTHER): Payer: Self-pay | Admitting: Family Medicine

## 2018-05-12 DIAGNOSIS — Z6839 Body mass index (BMI) 39.0-39.9, adult: Secondary | ICD-10-CM

## 2018-05-12 DIAGNOSIS — I10 Essential (primary) hypertension: Secondary | ICD-10-CM | POA: Diagnosis not present

## 2018-05-12 NOTE — Progress Notes (Signed)
Office: [phone]  /  Fax: [phone] TeleHealth Visit:  [name] has verbally consented to this TeleHealth visit today. The patient is located at home, the provider is located at the [employer] [facility] office. The participants in this visit include the listed provider and patient. Tonya Nicholson was unable to use realtime audiovisual technology today and the Telehealth visit was conducted via telephone.  HPI:   Chief Complaint: OBESITY Tonya Nicholson is here to discuss her progress with her obesity treatment plan. She is keeping a food journal with 1000 to 1300 calories and 70 grams of protein  and is following her eating plan approximately 90 % of the time. She states she is doing yard work and walking. 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 She is doing some yard work to help stay occupied. Tonya Nicholson is doing better with journaling than with following a structured plan. She thinks that she is doing well with protein goals.  We were unable to weigh the patient today for this TeleHealth visit. She feels as if she has been doing better since her last visit. She has lost 11 lbs since starting treatment with [place].  Hypertension Tonya Nicholson is a 58 y.o. female with hypertension. Tonya Nicholson's blood pressure has been currently well controlled on medications and diet. She is working on weight loss to help control her blood pressure with the goal of decreasing her risk of heart attack and stroke. Tonya Nicholson denies chest pain, headache, or lightheadedness.  ASSESSMENT AND PLAN:  Essential hypertension  Class 2 severe obesity with serious comorbidity and body mass index (BMI) of 39.0 to 39.9 in adult, unspecified obesity type ([place])  PLAN:  Hypertension We discussed sodium restriction, working on healthy weight loss, and a regular exercise program as the means to achieve improved blood pressure control. We will continue to monitor her blood pressure as well as her progress with  the above lifestyle modifications. She will continue her diet, exercise, and medications as prescribed and will watch for signs of hypotension as she continues her lifestyle modifications. Tonya Nicholson agreed with this plan and agreed to follow up as directed in 2 to 3 weeks.  I spent > than 50% of the 15 minute visit on counseling as documented in the note.  Obesity Tonya Nicholson is currently in the action stage of change. As such, her goal is to continue with weight loss efforts. She has agreed to keep a food journal with 1000 to 1300 calories and 70+ grams of protein.  Tonya Nicholson has been instructed to work up to a goal of 150 minutes of combined cardio and strengthening exercise per week for weight loss and overall health benefits. We discussed the following Behavioral Modification Strategies today: increasing lean protein intake, decreasing simple carbohydrates, keeping healthy foods in the home, and work on meal planning and easy cooking plans.  Tonya Nicholson has agreed to follow up with our clinic in 2 to 3 weeks. She was informed of the importance of frequent follow up visits to maximize her success with intensive lifestyle modifications for her multiple health conditions.  ALLERGIES: No Known Allergies  MEDICATIONS: Current Outpatient Medications on File Prior to Visit  Medication Sig Dispense Refill  . amLODipine-benazepril (LOTREL) 5-40 MG per capsule TAKE 1 CAPSULE BY MOUTH DAILY. 90 capsule 0  . apixaban (ELIQUIS) 5 MG TABS tablet Take 1 tablet (5 mg total) by mouth 2 (two) times daily. 60 tablet 3  . metoprolol tartrate (LOPRESSOR) 25 MG tablet  Take 1 tablet (25 mg total) by mouth 2 (two) times daily. 60 tablet 3  . Vitamin D, Ergocalciferol, (DRISDOL) 1.25 MG (50000 UT) CAPS capsule Take 1 capsule (50,000 Units total) by mouth every 7 (seven) days. 4 capsule 0   No current facility-administered medications on file prior to visit.     PAST MEDICAL HISTORY: Past Medical History:  Diagnosis Date  .  Dyspnea   . GERD (gastroesophageal reflux disease)   . Hypertension   . Joint pain   . Knee pain   . Lower extremity edema     PAST SURGICAL HISTORY: Past Surgical History:  Procedure Laterality Date  . CARDIOVERSION N/A 03/21/2018   Procedure: CARDIOVERSION;  Surgeon: Sueanne Margarita, MD;  Location: [facility] ENDOSCOPY;  Service: Cardiovascular;  Laterality: N/A;  . CARDIOVERSION N/A 03/31/2018   Procedure: CARDIOVERSION;  Surgeon: Constance Haw, MD;  Location: [place];  Service: Cardiovascular;  Laterality: N/A;    SOCIAL HISTORY: Social History   Tobacco Use  . Smoking status: Never Smoker  . Smokeless tobacco: Never Used  Substance Use Topics  . Alcohol use: Yes    Alcohol/week: 9.0 standard drinks    Types: 7 Standard drinks or equivalent, 2 Shots of liquor per week  . Drug use: No    FAMILY HISTORY: Family History  Problem Relation Age of Onset  . Diabetes Mother   . Hypertension Mother   . Atrial fibrillation Mother   . Diabetes Father   . Hypertension Father   . Heart disease Father        multiple PCI & CABGx3  . Bladder Cancer Father   . Healthy Sister   . Diabetes Brother   . Heart attack Brother        died in May 08, 2016    ROS: Review of Systems  Cardiovascular: Negative for chest pain.  Neurological: Negative for headaches.       Negative for lightheadedness.    PHYSICAL EXAM: Pt in no acute distress  RECENT LABS AND TESTS: BMET    Component Value Date/Time   NA 141 03/21/2018 0853   NA 141 02/24/2018 1508   K 4.0 03/21/2018 0853   CL 102 02/24/2018 1508   CO2 21 02/24/2018 1508   GLUCOSE 121 (H) 03/21/2018 0853   BUN 12 02/24/2018 1508   CREATININE 0.86 02/24/2018 1508   CALCIUM 9.2 02/24/2018 1508   GFRNONAA 75 02/24/2018 1508   GFRAA 87 02/24/2018 1508   Lab Results  Component Value Date   HGBA1C 6.2 (H) 02/24/2018   HGBA1C 6.4 (H) 05/08/2016   Lab Results  Component Value Date   INSULIN 10.7 02/24/2018   CBC     Component Value Date/Time   WBC 7.5 02/24/2018 1508   RBC 4.85 02/24/2018 1508   HGB 13.9 03/21/2018 0853   HGB 14.5 02/24/2018 1508   HCT 41.0 03/21/2018 0853   HCT 43.7 02/24/2018 1508   PLT 273 05/08/2016 0829   MCV 90 02/24/2018 1508   MCH 29.9 02/24/2018 1508   MCHC 33.2 02/24/2018 1508   RDW 12.1 02/24/2018 1508   LYMPHSABS 1.7 02/24/2018 1508   EOSABS 0.1 02/24/2018 1508   BASOSABS 0.1 02/24/2018 1508   Iron/TIBC/Ferritin/ %Sat No results found for: IRON, TIBC, FERRITIN, IRONPCTSAT Lipid Panel     Component Value Date/Time   CHOL 143 02/24/2018 1508   TRIG 100 02/24/2018 1508   HDL 52 02/24/2018 1508   CHOLHDL 2.8 05/08/2016 0829   LDLCALC 71 02/24/2018 1508  Hepatic Function Panel     Component Value Date/Time   PROT 6.6 02/24/2018 1508   ALBUMIN 4.2 02/24/2018 1508   AST 15 02/24/2018 1508   ALT 15 02/24/2018 1508   ALKPHOS 84 02/24/2018 1508   BILITOT 0.6 02/24/2018 1508      Component Value Date/Time   TSH 1.410 02/24/2018 1508   TSH 1.020 05/08/2016 0829   Results for SPECIAL, RANES (MRN [id number]) as of 05/12/2018 11:19  Ref. Range 02/24/2018 15:08  Vitamin D, 25-Hydroxy Latest Ref Range: 30.0 - 100.0 ng/mL 20.7 (L)     I, Marcille Blanco, CMA, am acting as transcriptionist for Starlyn Skeans, MD I have reviewed the above documentation for accuracy and completeness, and I agree with the above. -Dennard Nip, MD

## 2018-05-15 ENCOUNTER — Other Ambulatory Visit (INDEPENDENT_AMBULATORY_CARE_PROVIDER_SITE_OTHER): Payer: Self-pay | Admitting: Family Medicine

## 2018-05-15 DIAGNOSIS — E559 Vitamin D deficiency, unspecified: Secondary | ICD-10-CM

## 2018-05-23 MED FILL — AMLODIPINE-BENAZEPRIL 5-40: 5-40 | 30 days supply | Qty: 30 | Fill #0 | Status: TO

## 2018-05-23 MED FILL — METOPROLOL TARTRATE 25 MG T: 25 | 30 days supply | Qty: 60 | Fill #0

## 2018-06-02 ENCOUNTER — Ambulatory Visit (INDEPENDENT_AMBULATORY_CARE_PROVIDER_SITE_OTHER): Payer: Self-pay | Admitting: Family Medicine

## 2018-06-21 ENCOUNTER — Other Ambulatory Visit: Payer: Self-pay | Admitting: Cardiology

## 2018-06-21 ENCOUNTER — Other Ambulatory Visit: Payer: Self-pay

## 2018-06-21 ENCOUNTER — Ambulatory Visit (INDEPENDENT_AMBULATORY_CARE_PROVIDER_SITE_OTHER): Payer: 59 | Admitting: Family Medicine

## 2018-06-21 ENCOUNTER — Encounter (INDEPENDENT_AMBULATORY_CARE_PROVIDER_SITE_OTHER): Payer: Self-pay | Admitting: Family Medicine

## 2018-06-21 DIAGNOSIS — Z6839 Body mass index (BMI) 39.0-39.9, adult: Secondary | ICD-10-CM

## 2018-06-21 DIAGNOSIS — E559 Vitamin D deficiency, unspecified: Secondary | ICD-10-CM | POA: Diagnosis not present

## 2018-06-21 MED ORDER — VITAMIN D (ERGOCALCIFEROL) 1.25 MG (50000 UNIT) PO CAPS: 50000.0000 [IU] | ORAL_CAPSULE | ORAL | 0 refills | Status: DC

## 2018-06-21 MED FILL — AMLODIPINE-BENAZEPRIL 5-40: 5-40 | 30 days supply | Qty: 30 | Fill #0

## 2018-06-21 MED FILL — METOPROLOL TARTRATE 25 MG T: 25 | 90 days supply | Qty: 180 | Fill #0

## 2018-06-21 MED FILL — ELIQUIS 5 MG TABLET: 5 | 30 days supply | Qty: 60 | Fill #2

## 2018-06-21 MED FILL — VIT D2 1.25 MG (50,000 UNIT: 1.25 MG | 28 days supply | Qty: 4 | Fill #0

## 2018-06-22 NOTE — Progress Notes (Signed)
Office: [phone]  /  Fax: [phone] TeleHealth Visit:  Tonya Nicholson has verbally consented to this TeleHealth visit today. The patient is located at home, the provider is located at the [facility] office. The participants in this visit include the listed provider and patient. The visit was conducted today via Face Time.  HPI:   Chief Complaint: OBESITY Tonya Nicholson is here to discuss her progress with her obesity treatment plan. She is on the Category 2 plan and is following her eating plan approximately 70 % of the time. She states she is exercising 0 minutes 0 times per week. Tonya Nicholson feels that she is doing well maintaining her weight during [place] isolation. Her work schedule is all over the place, which continues to make more meal planning and prepping more difficult. She is working to increase lean protein, even though the grocery store options are limited.  We were unable to weigh the patient today for this TeleHealth visit. She feels as if she has maintained weight since her last visit. She has lost 11 lbs since starting treatment with [place].  Vitamin D Deficiency Tonya Nicholson has a diagnosis of vitamin D deficiency. She is currently stable on vit D, but is not yet at goal. Tonya Nicholson denies nausea, vomiting, or muscle weakness.  ASSESSMENT AND PLAN:  Vitamin D deficiency - Plan: Vitamin D, Ergocalciferol, (DRISDOL) 1.25 MG (50000 UT) CAPS capsule  Class 2 severe obesity with serious comorbidity and body mass index (BMI) of 39.0 to 39.9 in adult, unspecified obesity type ([place])  PLAN:  Vitamin D Deficiency Tonya Nicholson was informed that low vitamin D levels contribute to fatigue and are associated with obesity, breast, and colon cancer. Tonya Nicholson agrees to continue to take prescription Vit D @50 ,000 IU every week #4 with no refills and will follow up for routine testing of vitamin D, at least 2-3 times per year. She was informed of the risk of over-replacement of vitamin D and agrees to not  increase her dose unless she discusses this with [place] first. Tonya Nicholson agrees to follow up in 2 weeks as directed.  Obesity Tonya Nicholson is currently in the action stage of change. As such, her goal is to continue with weight loss efforts. She has agreed to follow the Category 2 plan and keep a food journal with 1000 to 1200 calories and 70 grams of protein daily. Tonya Nicholson has been instructed to work up to a goal of 150 minutes of combined cardio and strengthening exercise per week for weight loss and overall health benefits. We discussed the following Behavioral Modification Strategies today: increasing lean protein intake and decreasing simple carbohydrates.   Tonya Nicholson has agreed to follow up with our clinic in 2 weeks. She was informed of the importance of frequent follow up visits to maximize her success with intensive lifestyle modifications for her multiple health conditions.  ALLERGIES: No Known Allergies  MEDICATIONS: Current Outpatient Medications on File Prior to Visit  Medication Sig Dispense Refill  . amLODipine-benazepril (LOTREL) 5-40 MG per capsule TAKE 1 CAPSULE BY MOUTH DAILY. 90 capsule 0  . apixaban (ELIQUIS) 5 MG TABS tablet Take 1 tablet (5 mg total) by mouth 2 (two) times daily. 60 tablet 3   No current facility-administered medications on file prior to visit.     PAST MEDICAL HISTORY: Past Medical History:  Diagnosis Date  . Dyspnea   . GERD (gastroesophageal reflux disease)   . Hypertension   . Joint pain   . Knee pain   . Lower  extremity edema     PAST SURGICAL HISTORY: Past Surgical History:  Procedure Laterality Date  . CARDIOVERSION N/A 03/21/2018   Procedure: CARDIOVERSION;  Surgeon: Sueanne Margarita, MD;  Location: [facility] ENDOSCOPY;  Service: Cardiovascular;  Laterality: N/A;  . CARDIOVERSION N/A 03/31/2018   Procedure: CARDIOVERSION;  Surgeon: Constance Haw, MD;  Location: [place];  Service: Cardiovascular;  Laterality: N/A;    SOCIAL HISTORY: Social  History   Tobacco Use  . Smoking status: Never Smoker  . Smokeless tobacco: Never Used  Substance Use Topics  . Alcohol use: Yes    Alcohol/week: 9.0 standard drinks    Types: 7 Standard drinks or equivalent, 2 Shots of liquor per week  . Drug use: No    FAMILY HISTORY: Family History  Problem Relation Age of Onset  . Diabetes Mother   . Hypertension Mother   . Atrial fibrillation Mother   . Diabetes Father   . Hypertension Father   . Heart disease Father        multiple PCI & CABGx3  . Bladder Cancer Father   . Healthy Sister   . Diabetes Brother   . Heart attack Brother        died in 05/15/2016    ROS: Review of Systems  Gastrointestinal: Negative for nausea and vomiting.  Musculoskeletal:       Negative for muscle weakness.    PHYSICAL EXAM: Pt in no acute distress  RECENT LABS AND TESTS: BMET    Component Value Date/Time   NA 141 03/21/2018 0853   NA 141 02/24/2018 1508   K 4.0 03/21/2018 0853   CL 102 02/24/2018 1508   CO2 21 02/24/2018 1508   GLUCOSE 121 (H) 03/21/2018 0853   BUN 12 02/24/2018 1508   CREATININE 0.86 02/24/2018 1508   CALCIUM 9.2 02/24/2018 1508   GFRNONAA 75 02/24/2018 1508   GFRAA 87 02/24/2018 1508   Lab Results  Component Value Date   HGBA1C 6.2 (H) 02/24/2018   HGBA1C 6.4 (H) 05/08/2016   Lab Results  Component Value Date   INSULIN 10.7 02/24/2018   CBC    Component Value Date/Time   WBC 7.5 02/24/2018 1508   RBC 4.85 02/24/2018 1508   HGB 13.9 03/21/2018 0853   HGB 14.5 02/24/2018 1508   HCT 41.0 03/21/2018 0853   HCT 43.7 02/24/2018 1508   PLT 273 05/08/2016 0829   MCV 90 02/24/2018 1508   MCH 29.9 02/24/2018 1508   MCHC 33.2 02/24/2018 1508   RDW 12.1 02/24/2018 1508   LYMPHSABS 1.7 02/24/2018 1508   EOSABS 0.1 02/24/2018 1508   BASOSABS 0.1 02/24/2018 1508   Iron/TIBC/Ferritin/ %Sat No results found for: IRON, TIBC, FERRITIN, IRONPCTSAT Lipid Panel     Component Value Date/Time   CHOL 143 02/24/2018 1508    TRIG 100 02/24/2018 1508   HDL 52 02/24/2018 1508   CHOLHDL 2.8 05/08/2016 0829   LDLCALC 71 02/24/2018 1508   Hepatic Function Panel     Component Value Date/Time   PROT 6.6 02/24/2018 1508   ALBUMIN 4.2 02/24/2018 1508   AST 15 02/24/2018 1508   ALT 15 02/24/2018 1508   ALKPHOS 84 02/24/2018 1508   BILITOT 0.6 02/24/2018 1508      Component Value Date/Time   TSH 1.410 02/24/2018 1508   TSH 1.020 05/08/2016 0829    Results for ALYSHA, DOOLAN (MRN [id number]) as of 06/22/2018 09:13  Ref. Range 02/24/2018 15:08  Vitamin D, 25-Hydroxy Latest Ref Range: 30.0 -  100.0 ng/mL 20.7 (L)    I, Marcille Blanco, CMA, am acting as transcriptionist for Starlyn Skeans, MD I have reviewed the above documentation for accuracy and completeness, and I agree with the above. -Dennard Nip, MD

## 2018-07-07 ENCOUNTER — Other Ambulatory Visit: Payer: Self-pay

## 2018-07-07 ENCOUNTER — Encounter (INDEPENDENT_AMBULATORY_CARE_PROVIDER_SITE_OTHER): Payer: Self-pay | Admitting: Family Medicine

## 2018-07-07 ENCOUNTER — Ambulatory Visit (INDEPENDENT_AMBULATORY_CARE_PROVIDER_SITE_OTHER): Payer: 59 | Admitting: Family Medicine

## 2018-07-07 DIAGNOSIS — I10 Essential (primary) hypertension: Secondary | ICD-10-CM | POA: Diagnosis not present

## 2018-07-07 DIAGNOSIS — Z6841 Body Mass Index (BMI) 40.0 and over, adult: Secondary | ICD-10-CM

## 2018-07-12 NOTE — Progress Notes (Signed)
Office: [phone]  /  Fax: [phone] TeleHealth Visit:  Tonya Nicholson has verbally consented to this TeleHealth visit today. The patient is located at home, the provider is located at the [employer] [facility] office. The participants in this visit include the listed provider and patient and any and all parties involved. The visit was conducted today via FaceTime.  HPI:   Chief Complaint: OBESITY Tonya Nicholson is here to discuss her progress with her obesity treatment plan. She is on the Category 2 plan and is following her eating plan approximately 75 % of the time. She states she is exercising 0 minutes 0 times per week. Tonya Nicholson continues to do well with maintaining her weight. Tonya Nicholson notes, she frequently skips meals, and she is often not meeting her protein goals. Tonya Nicholson states that she is ready to get back on track. We were unable to weigh the patient today for this TeleHealth visit. She feels as if she has maintained weight since her last visit. She has lost 11 lbs since starting treatment with [place].  Hypertension Tonya Nicholson is a 58 y.o. female with hypertension. Her blood pressure is stable on medications. Tonya Nicholson denies chest pain or headache. She is working on diet and weight loss to help control her blood pressure with the goal of decreasing her risk of heart attack and stroke. Tonya Nicholson blood pressure is currently controlled.  ASSESSMENT AND PLAN:  Essential hypertension  Class 3 severe obesity with serious comorbidity and body mass index (BMI) of 40.0 to 44.9 in adult, unspecified obesity type ([place])  PLAN:  Hypertension We discussed sodium restriction, working on healthy weight loss, and a regular exercise program as the means to achieve improved blood pressure control. Tonya Nicholson agreed with this plan and agreed to follow up as directed. We will continue to monitor her blood pressure as well as her progress with the above lifestyle modifications. Tonya Nicholson will continue HCTZ  and she will increase her water intake. She will watch for signs of hypotension as she continues her lifestyle modifications.  I spent > than 50% of the 15 minute visit on counseling as documented in the note.  Obesity Tonya Nicholson is currently in the action stage of change. As such, her goal is to continue with weight loss efforts She has agreed to follow the Category 2 plan Tonya Nicholson has been instructed to work up to a goal of 150 minutes of combined cardio and strengthening exercise per week for weight loss and overall health benefits. We discussed the following Behavioral Modification Strategies today: increase H2O intake, no skipping meals, decreasing sodium intake and work on meal planning and easy cooking plans  Tonya Nicholson has agreed to follow up with our clinic in 2 weeks. She was informed of the importance of frequent follow up visits to maximize her success with intensive lifestyle modifications for her multiple health conditions.  ALLERGIES: No Known Allergies  MEDICATIONS: Current Outpatient Medications on File Prior to Visit  Medication Sig Dispense Refill  . amLODipine-benazepril (LOTREL) 5-40 MG per capsule TAKE 1 CAPSULE BY MOUTH DAILY. 90 capsule 0  . apixaban (ELIQUIS) 5 MG TABS tablet Take 1 tablet (5 mg total) by mouth 2 (two) times daily. 60 tablet 3  . metoprolol tartrate (LOPRESSOR) 25 MG tablet TAKE 1 TABLET BY MOUTH TWO TIMES DAILY 180 tablet 1  . Vitamin D, Ergocalciferol, (DRISDOL) 1.25 MG (50000 UT) CAPS capsule Take 1 capsule (50,000 Units total) by mouth every 7 (seven) days. 4 capsule 0  No current facility-administered medications on file prior to visit.     PAST MEDICAL HISTORY: Past Medical History:  Diagnosis Date  . Dyspnea   . GERD (gastroesophageal reflux disease)   . Hypertension   . Joint pain   . Knee pain   . Lower extremity edema     PAST SURGICAL HISTORY: Past Surgical History:  Procedure Laterality Date  . CARDIOVERSION N/A 03/21/2018    Procedure: CARDIOVERSION;  Surgeon: Sueanne Margarita, MD;  Location: [facility] ENDOSCOPY;  Service: Cardiovascular;  Laterality: N/A;  . CARDIOVERSION N/A 03/31/2018   Procedure: CARDIOVERSION;  Surgeon: Constance Haw, MD;  Location: [place];  Service: Cardiovascular;  Laterality: N/A;    SOCIAL HISTORY: Social History   Tobacco Use  . Smoking status: Never Smoker  . Smokeless tobacco: Never Used  Substance Use Topics  . Alcohol use: Yes    Alcohol/week: 9.0 standard drinks    Types: 7 Standard drinks or equivalent, 2 Shots of liquor per week  . Drug use: No    FAMILY HISTORY: Family History  Problem Relation Age of Onset  . Diabetes Mother   . Hypertension Mother   . Atrial fibrillation Mother   . Diabetes Father   . Hypertension Father   . Heart disease Father        multiple PCI & CABGx3  . Bladder Cancer Father   . Healthy Sister   . Diabetes Brother   . Heart attack Brother        died in April 20, 2016    ROS: Review of Systems  Constitutional: Negative for weight loss.  Cardiovascular: Negative for chest pain.  Neurological: Negative for headaches.    PHYSICAL EXAM: Pt in no acute distress  RECENT LABS AND TESTS: BMET    Component Value Date/Time   NA 141 03/21/2018 0853   NA 141 02/24/2018 1508   K 4.0 03/21/2018 0853   CL 102 02/24/2018 1508   CO2 21 02/24/2018 1508   GLUCOSE 121 (H) 03/21/2018 0853   BUN 12 02/24/2018 1508   CREATININE 0.86 02/24/2018 1508   CALCIUM 9.2 02/24/2018 1508   GFRNONAA 75 02/24/2018 1508   GFRAA 87 02/24/2018 1508   Lab Results  Component Value Date   HGBA1C 6.2 (H) 02/24/2018   HGBA1C 6.4 (H) 05/08/2016   Lab Results  Component Value Date   INSULIN 10.7 02/24/2018   CBC    Component Value Date/Time   WBC 7.5 02/24/2018 1508   RBC 4.85 02/24/2018 1508   HGB 13.9 03/21/2018 0853   HGB 14.5 02/24/2018 1508   HCT 41.0 03/21/2018 0853   HCT 43.7 02/24/2018 1508   PLT 273 05/08/2016 0829   MCV 90 02/24/2018 1508    MCH 29.9 02/24/2018 1508   MCHC 33.2 02/24/2018 1508   RDW 12.1 02/24/2018 1508   LYMPHSABS 1.7 02/24/2018 1508   EOSABS 0.1 02/24/2018 1508   BASOSABS 0.1 02/24/2018 1508   Iron/TIBC/Ferritin/ %Sat No results found for: IRON, TIBC, FERRITIN, IRONPCTSAT Lipid Panel     Component Value Date/Time   CHOL 143 02/24/2018 1508   TRIG 100 02/24/2018 1508   HDL 52 02/24/2018 1508   CHOLHDL 2.8 05/08/2016 0829   LDLCALC 71 02/24/2018 1508   Hepatic Function Panel     Component Value Date/Time   PROT 6.6 02/24/2018 1508   ALBUMIN 4.2 02/24/2018 1508   AST 15 02/24/2018 1508   ALT 15 02/24/2018 1508   ALKPHOS 84 02/24/2018 1508   BILITOT 0.6 02/24/2018  1508      Component Value Date/Time   TSH 1.410 02/24/2018 1508   TSH 1.020 05/08/2016 0829     Ref. Range 02/24/2018 15:08  Vitamin D, 25-Hydroxy Latest Ref Range: 30.0 - 100.0 ng/mL 20.7 (L)    I, Doreene Nest, am acting as transcriptionist for Dennard Nip, MD I have reviewed the above documentation for accuracy and completeness, and I agree with the above. -Dennard Nip, MD

## 2018-07-21 ENCOUNTER — Encounter (INDEPENDENT_AMBULATORY_CARE_PROVIDER_SITE_OTHER): Payer: Self-pay | Admitting: Family Medicine

## 2018-07-21 ENCOUNTER — Telehealth (INDEPENDENT_AMBULATORY_CARE_PROVIDER_SITE_OTHER): Payer: 59 | Admitting: Family Medicine

## 2018-07-21 ENCOUNTER — Other Ambulatory Visit: Payer: Self-pay

## 2018-07-21 DIAGNOSIS — Z6841 Body Mass Index (BMI) 40.0 and over, adult: Secondary | ICD-10-CM

## 2018-07-21 DIAGNOSIS — E559 Vitamin D deficiency, unspecified: Secondary | ICD-10-CM

## 2018-07-21 MED ORDER — VITAMIN D (ERGOCALCIFEROL) 1.25 MG (50000 UNIT) PO CAPS: 50000.0000 [IU] | ORAL_CAPSULE | ORAL | 0 refills | Status: DC

## 2018-07-21 MED FILL — VIT D2 1.25 MG (50,000 UNIT: 1.25 MG | 28 days supply | Qty: 4 | Fill #0

## 2018-07-25 NOTE — Progress Notes (Signed)
Office: [phone]  /  Fax: [phone] TeleHealth Visit:  Tonya Nicholson has verbally consented to this TeleHealth visit today. The patient is located at home, the provider is located at the [employer] [facility] office. The participants in this visit include the listed provider and patient. The visit was conducted today via telephone call (FaceTime failed and changed to telephone call).  HPI:   Chief Complaint: OBESITY Tonya Nicholson is here to discuss her progress with her obesity treatment plan. She is on the Category 2 plan and is following her eating plan approximately 70% of the time. She states she is walking 30 minutes 3 times per week. Tonya Nicholson continues to do well maintaining her weight during the pandemic. She has started walking and feels good about this. She is back to working full-time and getting used to her new routine. She reports hunger is controlled. We were unable to weigh the patient today for this TeleHealth visit. She feels as if she has maintained her weight since her last visit. She has lost 11 lbs since starting treatment with [place].  Vitamin D deficiency Tonya Nicholson has a diagnosis of Vitamin D deficiency, which is not yet at goal. Her last Vitamin D level was reported to be 20.7 on 02/24/2018. She is stable on prescription Vit D and denies nausea, vomiting or muscle weakness.  ASSESSMENT AND PLAN:  Vitamin D deficiency - Plan: Vitamin D, Ergocalciferol, (DRISDOL) 1.25 MG (50000 UT) CAPS capsule  Class 3 severe obesity with serious comorbidity and body mass index (BMI) of 40.0 to 44.9 in adult, unspecified obesity type ([place])  PLAN:  Vitamin D Deficiency Tonya Nicholson was informed that low Vitamin D levels contributes to fatigue and are associated with obesity, breast, and colon cancer. She agrees to continue to take prescription Vit D @ 50,000 IU every week #4 with 0 refills and will follow-up for routine testing of Vitamin D at her next in office visit. She was informed of the  risk of over-replacement of Vitamin D and agrees to not increase her dose unless she discusses this with [place] first. Fabiha agrees to follow-up with our clinic in 2-3 weeks.  I spent > than 50% of the 15 minute visit on counseling as documented in the note.  Obesity Tonya Nicholson is currently in the action stage of change. As such, her goal is to continue with weight loss efforts. She has agreed to follow the Category 2 plan. Tonya Nicholson has been instructed to work up to a goal of 150 minutes of combined cardio and strengthening exercise per week for weight loss and overall health benefits. We discussed the following Behavioral Modification Strategies today: increasing vegetables, no skipping meals, and ways to avoid night time snacking.  Tonya Nicholson has agreed to follow-up with our clinic in 2-3 weeks. She was informed of the importance of frequent follow-up visits to maximize her success with intensive lifestyle modifications for her multiple health conditions.  ALLERGIES: No Known Allergies  MEDICATIONS: Current Outpatient Medications on File Prior to Visit  Medication Sig Dispense Refill  . amLODipine-benazepril (LOTREL) 5-40 MG per capsule TAKE 1 CAPSULE BY MOUTH DAILY. 90 capsule 0  . apixaban (ELIQUIS) 5 MG TABS tablet Take 1 tablet (5 mg total) by mouth 2 (two) times daily. 60 tablet 3  . metoprolol tartrate (LOPRESSOR) 25 MG tablet TAKE 1 TABLET BY MOUTH TWO TIMES DAILY 180 tablet 1   No current facility-administered medications on file prior to visit.     PAST MEDICAL HISTORY: Past Medical History:  Diagnosis Date  . Dyspnea   . GERD (gastroesophageal reflux disease)   . Hypertension   . Joint pain   . Knee pain   . Lower extremity edema     PAST SURGICAL HISTORY: Past Surgical History:  Procedure Laterality Date  . CARDIOVERSION N/A 03/21/2018   Procedure: CARDIOVERSION;  Surgeon: Sueanne Margarita, MD;  Location: [facility] ENDOSCOPY;  Service: Cardiovascular;  Laterality: N/A;  . CARDIOVERSION  N/A 03/31/2018   Procedure: CARDIOVERSION;  Surgeon: Constance Haw, MD;  Location: [place];  Service: Cardiovascular;  Laterality: N/A;    SOCIAL HISTORY: Social History   Tobacco Use  . Smoking status: Never Smoker  . Smokeless tobacco: Never Used  Substance Use Topics  . Alcohol use: Yes    Alcohol/week: 9.0 standard drinks    Types: 7 Standard drinks or equivalent, 2 Shots of liquor per week  . Drug use: No    FAMILY HISTORY: Family History  Problem Relation Age of Onset  . Diabetes Mother   . Hypertension Mother   . Atrial fibrillation Mother   . Diabetes Father   . Hypertension Father   . Heart disease Father        multiple PCI & CABGx3  . Bladder Cancer Father   . Healthy Sister   . Diabetes Brother   . Heart attack Brother        died in 2016-04-27   ROS: Review of Systems  Gastrointestinal: Negative for nausea and vomiting.  Musculoskeletal:       Negative for muscle weakness.   PHYSICAL EXAM: Pt in no acute distress  RECENT LABS AND TESTS: BMET    Component Value Date/Time   NA 141 03/21/2018 0853   NA 141 02/24/2018 1508   K 4.0 03/21/2018 0853   CL 102 02/24/2018 1508   CO2 21 02/24/2018 1508   GLUCOSE 121 (H) 03/21/2018 0853   BUN 12 02/24/2018 1508   CREATININE 0.86 02/24/2018 1508   CALCIUM 9.2 02/24/2018 1508   GFRNONAA 75 02/24/2018 1508   GFRAA 87 02/24/2018 1508   Lab Results  Component Value Date   HGBA1C 6.2 (H) 02/24/2018   HGBA1C 6.4 (H) 05/08/2016   Lab Results  Component Value Date   INSULIN 10.7 02/24/2018   CBC    Component Value Date/Time   WBC 7.5 02/24/2018 1508   RBC 4.85 02/24/2018 1508   HGB 13.9 03/21/2018 0853   HGB 14.5 02/24/2018 1508   HCT 41.0 03/21/2018 0853   HCT 43.7 02/24/2018 1508   PLT 273 05/08/2016 0829   MCV 90 02/24/2018 1508   MCH 29.9 02/24/2018 1508   MCHC 33.2 02/24/2018 1508   RDW 12.1 02/24/2018 1508   LYMPHSABS 1.7 02/24/2018 1508   EOSABS 0.1 02/24/2018 1508   BASOSABS 0.1  02/24/2018 1508   Iron/TIBC/Ferritin/ %Sat No results found for: IRON, TIBC, FERRITIN, IRONPCTSAT Lipid Panel     Component Value Date/Time   CHOL 143 02/24/2018 1508   TRIG 100 02/24/2018 1508   HDL 52 02/24/2018 1508   CHOLHDL 2.8 05/08/2016 0829   LDLCALC 71 02/24/2018 1508   Hepatic Function Panel     Component Value Date/Time   PROT 6.6 02/24/2018 1508   ALBUMIN 4.2 02/24/2018 1508   AST 15 02/24/2018 1508   ALT 15 02/24/2018 1508   ALKPHOS 84 02/24/2018 1508   BILITOT 0.6 02/24/2018 1508      Component Value Date/Time   TSH 1.410 02/24/2018 1508   TSH 1.020 05/08/2016  0829   Results for MARYLU, DUDENHOEFFER (MRN [id number]) as of 07/25/2018 11:54  Ref. Range 02/24/2018 15:08  Vitamin D, 25-Hydroxy Latest Ref Range: 30.0 - 100.0 ng/mL 20.7 (L)    I, Michaelene Song, am acting as Location manager for Dennard Nip, MD I have reviewed the above documentation for accuracy and completeness, and I agree with the above. -Dennard Nip, MD

## 2018-08-04 MED FILL — ELIQUIS 5 MG TABLET: 5 | 30 days supply | Qty: 60 | Fill #3

## 2018-08-04 MED FILL — AMLODIPINE-BENAZEPRIL 5-40: 5-40 | 30 days supply | Qty: 30 | Fill #0

## 2018-08-04 MED FILL — VIT D2 1.25 MG (50,000 UNIT: 1.25 MG | 28 days supply | Qty: 4 | Fill #0

## 2018-08-11 ENCOUNTER — Encounter (INDEPENDENT_AMBULATORY_CARE_PROVIDER_SITE_OTHER): Payer: Self-pay | Admitting: Family Medicine

## 2018-08-11 ENCOUNTER — Telehealth (INDEPENDENT_AMBULATORY_CARE_PROVIDER_SITE_OTHER): Payer: 59 | Admitting: Family Medicine

## 2018-08-11 ENCOUNTER — Other Ambulatory Visit: Payer: Self-pay

## 2018-08-11 DIAGNOSIS — I10 Essential (primary) hypertension: Secondary | ICD-10-CM | POA: Diagnosis not present

## 2018-08-11 DIAGNOSIS — Z6839 Body mass index (BMI) 39.0-39.9, adult: Secondary | ICD-10-CM

## 2018-08-15 NOTE — Progress Notes (Signed)
Office: [phone]  /  Fax: [phone] TeleHealth Visit:  Tonya Nicholson has verbally consented to this TeleHealth visit today. The patient is located at home, the provider is located at the [employer] [facility] office. The participants in this visit include the listed provider and patient. The visit was conducted today via FaceTime.  HPI:   Chief Complaint: OBESITY Tonya Nicholson is here to discuss her progress with her obesity treatment plan. She is on the Category 2 plan and is following her eating plan approximately 75% of the time. She states she is exercising 0 minutes 0 times per week. Tonya Nicholson continues to do well maintaining her weight. She is active doing yard work at her house and her daughter's but notes increased simple carbs. She is not following her plan as closely but is still being mindful. We were unable to weigh the patient today for this TeleHealth visit. She feels as if she has maintained her weight since her last visit. She has lost 11 lbs since starting treatment with [place].  Hypertension Tonya Nicholson is a 58 y.o. female with hypertension.  Tonya Nicholson denies chest pain, headache, or dizziness. She is working weight loss to help control her blood pressure with the goal of decreasing her risk of heart attack and stroke. Maddix's blood pressure is well controlled on medications. She is doing well decreasing her sodium and working on diet and weight loss.  ASSESSMENT AND PLAN:  Essential hypertension  Class 2 severe obesity with serious comorbidity and body mass index (BMI) of 39.0 to 39.9 in adult, unspecified obesity type ([place])  PLAN:  Hypertension We discussed sodium restriction, working on healthy weight loss, and a regular exercise program as the means to achieve improved blood pressure control. Tonya Nicholson agreed with this plan and agreed to follow up as directed. We will continue to monitor her blood pressure as well as her progress with the above lifestyle  modifications. She will continue her diet and medications as prescribed and will watch for signs of hypotension as she continues her lifestyle modifications. We will check labs at her next in-office visit.  I spent > than 50% of the 15 minute visit on counseling as documented in the note.  Obesity Tonya Nicholson is currently in the action stage of change. As such, her goal is to continue with weight loss efforts. She has agreed to follow the Category 2 plan. Tonya Nicholson has been instructed to work up to a goal of 150 minutes of combined cardio and strengthening exercise per week for weight loss and overall health benefits. We discussed the following Behavioral Modification Strategies today: decreasing simple carbohydrates, increase H20 intake, and decreasing sodium intake.  Tonya Nicholson has agreed to follow-up with our clinic in 2 weeks. She was informed of the importance of frequent follow-up visits to maximize her success with intensive lifestyle modifications for her multiple health conditions.  ALLERGIES: No Known Allergies  MEDICATIONS: Current Outpatient Medications on File Prior to Visit  Medication Sig Dispense Refill  . amLODipine-benazepril (LOTREL) 5-40 MG per capsule TAKE 1 CAPSULE BY MOUTH DAILY. 90 capsule 0  . apixaban (ELIQUIS) 5 MG TABS tablet Take 1 tablet (5 mg total) by mouth 2 (two) times daily. 60 tablet 3  . metoprolol tartrate (LOPRESSOR) 25 MG tablet TAKE 1 TABLET BY MOUTH TWO TIMES DAILY 180 tablet 1  . Vitamin D, Ergocalciferol, (DRISDOL) 1.25 MG (50000 UT) CAPS capsule Take 1 capsule (50,000 Units total) by mouth every 7 (seven) days. 4 capsule 0  No current facility-administered medications on file prior to visit.     PAST MEDICAL HISTORY: Past Medical History:  Diagnosis Date  . Dyspnea   . GERD (gastroesophageal reflux disease)   . Hypertension   . Joint pain   . Knee pain   . Lower extremity edema     PAST SURGICAL HISTORY: Past Surgical History:  Procedure  Laterality Date  . CARDIOVERSION N/A 03/21/2018   Procedure: CARDIOVERSION;  Surgeon: Sueanne Margarita, MD;  Location: [facility] ENDOSCOPY;  Service: Cardiovascular;  Laterality: N/A;  . CARDIOVERSION N/A 03/31/2018   Procedure: CARDIOVERSION;  Surgeon: Constance Haw, MD;  Location: [place];  Service: Cardiovascular;  Laterality: N/A;    SOCIAL HISTORY: Social History   Tobacco Use  . Smoking status: Never Smoker  . Smokeless tobacco: Never Used  Substance Use Topics  . Alcohol use: Yes    Alcohol/week: 9.0 standard drinks    Types: 7 Standard drinks or equivalent, 2 Shots of liquor per week  . Drug use: No    FAMILY HISTORY: Family History  Problem Relation Age of Onset  . Diabetes Mother   . Hypertension Mother   . Atrial fibrillation Mother   . Diabetes Father   . Hypertension Father   . Heart disease Father        multiple PCI & CABGx3  . Bladder Cancer Father   . Healthy Sister   . Diabetes Brother   . Heart attack Brother        died in 05-May-2016   ROS: Review of Systems  Cardiovascular: Negative for chest pain.  Neurological: Negative for dizziness and headaches.   PHYSICAL EXAM: Pt in no acute distress  RECENT LABS AND TESTS: BMET    Component Value Date/Time   NA 141 03/21/2018 0853   NA 141 02/24/2018 1508   K 4.0 03/21/2018 0853   CL 102 02/24/2018 1508   CO2 21 02/24/2018 1508   GLUCOSE 121 (H) 03/21/2018 0853   BUN 12 02/24/2018 1508   CREATININE 0.86 02/24/2018 1508   CALCIUM 9.2 02/24/2018 1508   GFRNONAA 75 02/24/2018 1508   GFRAA 87 02/24/2018 1508   Lab Results  Component Value Date   HGBA1C 6.2 (H) 02/24/2018   HGBA1C 6.4 (H) 05/08/2016   Lab Results  Component Value Date   INSULIN 10.7 02/24/2018   CBC    Component Value Date/Time   WBC 7.5 02/24/2018 1508   RBC 4.85 02/24/2018 1508   HGB 13.9 03/21/2018 0853   HGB 14.5 02/24/2018 1508   HCT 41.0 03/21/2018 0853   HCT 43.7 02/24/2018 1508   PLT 273 05/08/2016 0829   MCV 90  02/24/2018 1508   MCH 29.9 02/24/2018 1508   MCHC 33.2 02/24/2018 1508   RDW 12.1 02/24/2018 1508   LYMPHSABS 1.7 02/24/2018 1508   EOSABS 0.1 02/24/2018 1508   BASOSABS 0.1 02/24/2018 1508   Iron/TIBC/Ferritin/ %Sat No results found for: IRON, TIBC, FERRITIN, IRONPCTSAT Lipid Panel     Component Value Date/Time   CHOL 143 02/24/2018 1508   TRIG 100 02/24/2018 1508   HDL 52 02/24/2018 1508   CHOLHDL 2.8 05/08/2016 0829   LDLCALC 71 02/24/2018 1508   Hepatic Function Panel     Component Value Date/Time   PROT 6.6 02/24/2018 1508   ALBUMIN 4.2 02/24/2018 1508   AST 15 02/24/2018 1508   ALT 15 02/24/2018 1508   ALKPHOS 84 02/24/2018 1508   BILITOT 0.6 02/24/2018 1508  Component Value Date/Time   TSH 1.410 02/24/2018 1508   TSH 1.020 05/08/2016 0829   Results for VICTORA, IRBY (MRN [id number]) as of 08/15/2018 10:52  Ref. Range 02/24/2018 15:08  Vitamin D, 25-Hydroxy Latest Ref Range: 30.0 - 100.0 ng/mL 20.7 (L)   I, Michaelene Song, am acting as Location manager for Dennard Nip, MD I have reviewed the above documentation for accuracy and completeness, and I agree with the above. -Dennard Nip, MD

## 2018-08-25 ENCOUNTER — Encounter (INDEPENDENT_AMBULATORY_CARE_PROVIDER_SITE_OTHER): Payer: Self-pay | Admitting: Family Medicine

## 2018-08-25 ENCOUNTER — Other Ambulatory Visit: Payer: Self-pay

## 2018-08-25 ENCOUNTER — Telehealth (INDEPENDENT_AMBULATORY_CARE_PROVIDER_SITE_OTHER): Payer: 59 | Admitting: Family Medicine

## 2018-08-25 DIAGNOSIS — E559 Vitamin D deficiency, unspecified: Secondary | ICD-10-CM

## 2018-08-25 DIAGNOSIS — Z6839 Body mass index (BMI) 39.0-39.9, adult: Secondary | ICD-10-CM | POA: Diagnosis not present

## 2018-08-29 NOTE — Progress Notes (Signed)
Office: [phone]  /  Fax: [phone] TeleHealth Visit:  Tonya Nicholson has verbally consented to this TeleHealth visit today. The patient is located in the car, the provider is located at the [employer] [facility] office. The participants in this visit include the listed provider and patient. The visit was conducted today via telephone call (FaceTime failed - changed to telephone call).  HPI:   Chief Complaint: OBESITY Tonya Nicholson is here to discuss her progress with her obesity treatment plan. She is on the Category 2 plan and is following her eating plan approximately 90% of the time. She states she is walking 35-40 minutes 3 times per week. Tonya Nicholson states her weight today is 245. She has done well maintaining her weight and has even lost some during the pandemic. She is going to travel to see her mother and is working on how to meal plan for this. We were unable to weigh the patient today for this TeleHealth visit. She states her weight was 245 lbs yesterday. She has lost 11 lbs since starting treatment with [place].  Vitamin D deficiency Tonya Nicholson has a diagnosis of Vitamin D deficiency. She is currently stable on Vit D and denies nausea, vomiting or muscle weakness.  ASSESSMENT AND PLAN:  Vitamin D deficiency  Class 2 severe obesity with serious comorbidity and body mass index (BMI) of 39.0 to 39.9 in adult, unspecified obesity type (Tonya Nicholson)  PLAN:  Vitamin D Deficiency Tonya Nicholson was informed that low Vitamin D levels contributes to fatigue and are associated with obesity, breast, and colon cancer. She agrees to continue taking Vit D and will follow-up for routine testing of Vitamin D in 1 month. She was informed of the risk of over-replacement of Vitamin D and agrees to not increase her dose unless she discusses this with [place] first. Tonya Nicholson agrees to follow-up with our clinic in 2 weeks.  I spent > than 50% of the 15 minute visit on counseling as documented in the note.  Obesity Tonya Nicholson is  currently in the action stage of change. As such, her goal is to continue with weight loss efforts. She has agreed to follow the Category 2 plan. Tonya Nicholson has been instructed to work up to a goal of 150 minutes of combined cardio and strengthening exercise per week for weight loss and overall health benefits. We discussed the following Behavioral Modification Strategies today: increasing lean protein intake, work on meal planning and easy cooking plans, and travel eating strategies.  Tonya Nicholson has agreed to follow-up with our clinic in 2 weeks. She was informed of the importance of frequent follow-up visits to maximize her success with intensive lifestyle modifications for her multiple health conditions.  ALLERGIES: No Known Allergies  MEDICATIONS: Current Outpatient Medications on File Prior to Visit  Medication Sig Dispense Refill  . amLODipine-benazepril (LOTREL) 5-40 MG per capsule TAKE 1 CAPSULE BY MOUTH DAILY. 90 capsule 0  . apixaban (ELIQUIS) 5 MG TABS tablet Take 1 tablet (5 mg total) by mouth 2 (two) times daily. 60 tablet 3  . metoprolol tartrate (LOPRESSOR) 25 MG tablet TAKE 1 TABLET BY MOUTH TWO TIMES DAILY 180 tablet 1  . Vitamin D, Ergocalciferol, (DRISDOL) 1.25 MG (50000 UT) CAPS capsule Take 1 capsule (50,000 Units total) by mouth every 7 (seven) days. 4 capsule 0   No current facility-administered medications on file prior to visit.     PAST MEDICAL HISTORY: Past Medical History:  Diagnosis Date  . Dyspnea   . GERD (gastroesophageal reflux disease)   .  Hypertension   . Joint pain   . Knee pain   . Lower extremity edema     PAST SURGICAL HISTORY: Past Surgical History:  Procedure Laterality Date  . CARDIOVERSION N/A 03/21/2018   Procedure: CARDIOVERSION;  Surgeon: Sueanne Margarita, MD;  Location: [facility] ENDOSCOPY;  Service: Cardiovascular;  Laterality: N/A;  . CARDIOVERSION N/A 03/31/2018   Procedure: CARDIOVERSION;  Surgeon: Constance Haw, MD;  Location: [place];   Service: Cardiovascular;  Laterality: N/A;    SOCIAL HISTORY: Social History   Tobacco Use  . Smoking status: Never Smoker  . Smokeless tobacco: Never Used  Substance Use Topics  . Alcohol use: Yes    Alcohol/week: 9.0 standard drinks    Types: 7 Standard drinks or equivalent, 2 Shots of liquor per week  . Drug use: No    FAMILY HISTORY: Family History  Problem Relation Age of Onset  . Diabetes Mother   . Hypertension Mother   . Atrial fibrillation Mother   . Diabetes Father   . Hypertension Father   . Heart disease Father        multiple PCI & CABGx3  . Bladder Cancer Father   . Healthy Sister   . Diabetes Brother   . Heart attack Brother        died in May 14, 2016   ROS: Review of Systems  Gastrointestinal: Negative for nausea and vomiting.  Musculoskeletal:       Negative for muscle weakness.   PHYSICAL EXAM: Pt in no acute distress  RECENT LABS AND TESTS: BMET    Component Value Date/Time   NA 141 03/21/2018 0853   NA 141 02/24/2018 1508   K 4.0 03/21/2018 0853   CL 102 02/24/2018 1508   CO2 21 02/24/2018 1508   GLUCOSE 121 (H) 03/21/2018 0853   BUN 12 02/24/2018 1508   CREATININE 0.86 02/24/2018 1508   CALCIUM 9.2 02/24/2018 1508   GFRNONAA 75 02/24/2018 1508   GFRAA 87 02/24/2018 1508   Lab Results  Component Value Date   HGBA1C 6.2 (H) 02/24/2018   HGBA1C 6.4 (H) 05/08/2016   Lab Results  Component Value Date   INSULIN 10.7 02/24/2018   CBC    Component Value Date/Time   WBC 7.5 02/24/2018 1508   RBC 4.85 02/24/2018 1508   HGB 13.9 03/21/2018 0853   HGB 14.5 02/24/2018 1508   HCT 41.0 03/21/2018 0853   HCT 43.7 02/24/2018 1508   PLT 273 05/08/2016 0829   MCV 90 02/24/2018 1508   MCH 29.9 02/24/2018 1508   MCHC 33.2 02/24/2018 1508   RDW 12.1 02/24/2018 1508   LYMPHSABS 1.7 02/24/2018 1508   EOSABS 0.1 02/24/2018 1508   BASOSABS 0.1 02/24/2018 1508   Iron/TIBC/Ferritin/ %Sat No results found for: IRON, TIBC, FERRITIN, IRONPCTSAT  Lipid Panel     Component Value Date/Time   CHOL 143 02/24/2018 1508   TRIG 100 02/24/2018 1508   HDL 52 02/24/2018 1508   CHOLHDL 2.8 05/08/2016 0829   LDLCALC 71 02/24/2018 1508   Hepatic Function Panel     Component Value Date/Time   PROT 6.6 02/24/2018 1508   ALBUMIN 4.2 02/24/2018 1508   AST 15 02/24/2018 1508   ALT 15 02/24/2018 1508   ALKPHOS 84 02/24/2018 1508   BILITOT 0.6 02/24/2018 1508      Component Value Date/Time   TSH 1.410 02/24/2018 1508   TSH 1.020 05/08/2016 0829   Results for JONATHAN, KIRKENDOLL (MRN [id number]) as of 08/29/2018 11:59  Ref. Range 02/24/2018 15:08  Vitamin D, 25-Hydroxy Latest Ref Range: 30.0 - 100.0 ng/mL 20.7 (L)    I, Michaelene Song, am acting as Location manager for Dennard Nip, MD I have reviewed the above documentation for accuracy and completeness, and I agree with the above. -Dennard Nip, MD

## 2018-09-08 ENCOUNTER — Encounter (INDEPENDENT_AMBULATORY_CARE_PROVIDER_SITE_OTHER): Payer: Self-pay | Admitting: Family Medicine

## 2018-09-08 ENCOUNTER — Telehealth (INDEPENDENT_AMBULATORY_CARE_PROVIDER_SITE_OTHER): Payer: 59 | Admitting: Family Medicine

## 2018-09-08 ENCOUNTER — Other Ambulatory Visit: Payer: Self-pay

## 2018-09-08 DIAGNOSIS — R7303 Prediabetes: Secondary | ICD-10-CM | POA: Diagnosis not present

## 2018-09-08 DIAGNOSIS — Z6839 Body mass index (BMI) 39.0-39.9, adult: Secondary | ICD-10-CM | POA: Diagnosis not present

## 2018-09-08 DIAGNOSIS — E559 Vitamin D deficiency, unspecified: Secondary | ICD-10-CM | POA: Diagnosis not present

## 2018-09-08 MED ORDER — VITAMIN D (ERGOCALCIFEROL) 1.25 MG (50000 UNIT) PO CAPS: 50000.0000 [IU] | ORAL_CAPSULE | ORAL | 0 refills | Status: DC

## 2018-09-08 MED FILL — VIT D2 1.25 MG (50,000 UNIT: 1.25 MG | 28 days supply | Qty: 4 | Fill #0

## 2018-09-12 ENCOUNTER — Other Ambulatory Visit: Payer: Self-pay | Admitting: Cardiology

## 2018-09-12 MED FILL — METOPROLOL TARTRATE 25 MG T: 25 | 90 days supply | Qty: 180 | Fill #1

## 2018-09-12 MED FILL — ELIQUIS 5 MG TABLET: 5 | 90 days supply | Qty: 180 | Fill #0

## 2018-09-12 MED FILL — AMLODIPINE-BENAZEPRIL 5-40: 5-40 | 30 days supply | Qty: 30 | Fill #0

## 2018-09-12 NOTE — Telephone Encounter (Signed)
57f 114.3kg Scr 0.86 02/24/18 Lovw/camnitz 02/25/18

## 2018-09-14 NOTE — Progress Notes (Signed)
Office: [phone]  /  Fax: [phone] TeleHealth Visit:  Tonya Nicholson has verbally consented to this TeleHealth visit today. The patient is located at home, the provider is located at the [employer] [facility] office. The participants in this visit include the listed provider and patient. The visit was conducted today via face time.  HPI:   Chief Complaint: OBESITY Tonya Nicholson is here to discuss her progress with her obesity treatment plan. She is on the Category 2 plan and is following her eating plan approximately 75 % of the time. She states she has been doing a lot of yard work at her [employer]. Tonya Nicholson continues to do well with Category 2 plan. Her hunger is controlled when she follows her plan, and she has increased activity with increased yard work and other active jobs. She feels she has done well maintaining her weight.  We were unable to weigh the patient today for this TeleHealth visit. She feels as if she has maintained her weight since her last visit. She has lost 11 lbs since starting treatment with [place].  Vitamin D Deficiency Tonya Nicholson has a diagnosis of vitamin D deficiency. She is stable on prescription Vit D, but level is not yet at goal. She denies nausea, vomiting or muscle weakness.  Pre-Diabetes Tonya Nicholson has a diagnosis of pre-diabetes based on her elevated Hgb A1c and was informed this puts her at greater risk of developing diabetes. She is working on diet, exercise, and weight loss. She notes decreased polyphagia when she follows her plan. She denies nausea or hypoglycemia.  ASSESSMENT AND PLAN:  Prediabetes  Vitamin D deficiency - Plan: Vitamin D, Ergocalciferol, (DRISDOL) 1.25 MG (50000 UT) CAPS capsule  Class 2 severe obesity with serious comorbidity and body mass index (BMI) of 39.0 to 39.9 in adult, unspecified obesity type (Tonya Nicholson)  PLAN:  Vitamin D Deficiency Tonya Nicholson was informed that low vitamin D levels contributes to fatigue and are associated with obesity,  breast, and colon cancer. Tonya Nicholson agrees to continue taking prescription Vit D 50,000 IU every week #4 and we will refill for 1 month. She will follow up for routine testing of vitamin D, at least 2-3 times per year. She was informed of the risk of over-replacement of vitamin D and agrees to not increase her dose unless she discusses this with [place] first. Tonya Nicholson agrees to follow up with our clinic in 2 to 3 weeks.  Pre-Diabetes Tonya Nicholson will continue to work on diet, exercise, and weight loss, and decreasing simple carbohydrates in her diet to help decrease the risk of diabetes. We dicussed metformin including benefits and risks. She was informed that eating too many simple carbohydrates or too many calories at one sitting increases the likelihood of GI side effects. We will recheck labs in 1 month. Tonya Nicholson agrees to follow up with our clinic in 2 to 3 weeks as directed to monitor her progress.  Obesity Tonya Nicholson is currently in the action stage of change. As such, her goal is to continue with weight loss efforts She has agreed to follow the Category 2 plan Tonya Nicholson has been instructed to work up to a goal of 150 minutes of combined cardio and strengthening exercise per week for weight loss and overall health benefits. We discussed the following Behavioral Modification Strategies today: work on meal planning and easy cooking plans and no skipping meals   Tonya Nicholson has agreed to follow up with our clinic in 2 to 3 weeks. She was informed of the importance of  frequent follow up visits to maximize her success with intensive lifestyle modifications for her multiple health conditions.  ALLERGIES: No Known Allergies  MEDICATIONS: Current Outpatient Medications on File Prior to Visit  Medication Sig Dispense Refill  . amLODipine-benazepril (LOTREL) 5-40 MG per capsule TAKE 1 CAPSULE BY MOUTH DAILY. 90 capsule 0  . metoprolol tartrate (LOPRESSOR) 25 MG tablet TAKE 1 TABLET BY MOUTH TWO TIMES DAILY 180 tablet 1   No  current facility-administered medications on file prior to visit.     PAST MEDICAL HISTORY: Past Medical History:  Diagnosis Date  . Dyspnea   . GERD (gastroesophageal reflux disease)   . Hypertension   . Joint pain   . Knee pain   . Lower extremity edema     PAST SURGICAL HISTORY: Past Surgical History:  Procedure Laterality Date  . CARDIOVERSION N/A 03/21/2018   Procedure: CARDIOVERSION;  Surgeon: Sueanne Margarita, MD;  Location: [facility] ENDOSCOPY;  Service: Cardiovascular;  Laterality: N/A;  . CARDIOVERSION N/A 03/31/2018   Procedure: CARDIOVERSION;  Surgeon: Constance Haw, MD;  Location: [place];  Service: Cardiovascular;  Laterality: N/A;    SOCIAL HISTORY: Social History   Tobacco Use  . Smoking status: Never Smoker  . Smokeless tobacco: Never Used  Substance Use Topics  . Alcohol use: Yes    Alcohol/week: 9.0 standard drinks    Types: 7 Standard drinks or equivalent, 2 Shots of liquor per week  . Drug use: No    FAMILY HISTORY: Family History  Problem Relation Age of Onset  . Diabetes Mother   . Hypertension Mother   . Atrial fibrillation Mother   . Diabetes Father   . Hypertension Father   . Heart disease Father        multiple PCI & CABGx3  . Bladder Cancer Father   . Healthy Sister   . Diabetes Brother   . Heart attack Brother        died in May 17, 2016    ROS: Review of Systems  Constitutional: Negative for weight loss.  Gastrointestinal: Negative for nausea and vomiting.  Musculoskeletal:       Negative muscle weakness  Endo/Heme/Allergies:       Positive polyphagia Negative hypoglycemia    PHYSICAL EXAM: Pt in no acute distress  RECENT LABS AND TESTS: BMET    Component Value Date/Time   NA 141 03/21/2018 0853   NA 141 02/24/2018 1508   K 4.0 03/21/2018 0853   CL 102 02/24/2018 1508   CO2 21 02/24/2018 1508   GLUCOSE 121 (H) 03/21/2018 0853   BUN 12 02/24/2018 1508   CREATININE 0.86 02/24/2018 1508   CALCIUM 9.2 02/24/2018 1508    GFRNONAA 75 02/24/2018 1508   GFRAA 87 02/24/2018 1508   Lab Results  Component Value Date   HGBA1C 6.2 (H) 02/24/2018   HGBA1C 6.4 (H) 05/08/2016   Lab Results  Component Value Date   INSULIN 10.7 02/24/2018   CBC    Component Value Date/Time   WBC 7.5 02/24/2018 1508   RBC 4.85 02/24/2018 1508   HGB 13.9 03/21/2018 0853   HGB 14.5 02/24/2018 1508   HCT 41.0 03/21/2018 0853   HCT 43.7 02/24/2018 1508   PLT 273 05/08/2016 0829   MCV 90 02/24/2018 1508   MCH 29.9 02/24/2018 1508   MCHC 33.2 02/24/2018 1508   RDW 12.1 02/24/2018 1508   LYMPHSABS 1.7 02/24/2018 1508   EOSABS 0.1 02/24/2018 1508   BASOSABS 0.1 02/24/2018 1508   Iron/TIBC/Ferritin/ %Sat  No results found for: IRON, TIBC, FERRITIN, IRONPCTSAT Lipid Panel     Component Value Date/Time   CHOL 143 02/24/2018 1508   TRIG 100 02/24/2018 1508   HDL 52 02/24/2018 1508   CHOLHDL 2.8 05/08/2016 0829   LDLCALC 71 02/24/2018 1508   Hepatic Function Panel     Component Value Date/Time   PROT 6.6 02/24/2018 1508   ALBUMIN 4.2 02/24/2018 1508   AST 15 02/24/2018 1508   ALT 15 02/24/2018 1508   ALKPHOS 84 02/24/2018 1508   BILITOT 0.6 02/24/2018 1508      Component Value Date/Time   TSH 1.410 02/24/2018 1508   TSH 1.020 05/08/2016 0829      I, Trixie Dredge, am acting as transcriptionist for Dennard Nip, MD I have reviewed the above documentation for accuracy and completeness, and I agree with the above. -Dennard Nip, MD

## 2018-09-28 ENCOUNTER — Other Ambulatory Visit: Payer: Self-pay

## 2018-09-28 ENCOUNTER — Telehealth (INDEPENDENT_AMBULATORY_CARE_PROVIDER_SITE_OTHER): Payer: 59 | Admitting: Family Medicine

## 2018-09-28 ENCOUNTER — Encounter (INDEPENDENT_AMBULATORY_CARE_PROVIDER_SITE_OTHER): Payer: Self-pay | Admitting: Family Medicine

## 2018-09-28 DIAGNOSIS — Z6839 Body mass index (BMI) 39.0-39.9, adult: Secondary | ICD-10-CM

## 2018-09-28 DIAGNOSIS — R7303 Prediabetes: Secondary | ICD-10-CM

## 2018-09-29 NOTE — Progress Notes (Addendum)
Office: [phone]  /  Fax: [phone] TeleHealth Visit:  CATRIN HEADDEN has verbally consented to this TeleHealth visit today. The patient is located at home, the provider is located at the [employer] [facility] office. The participants in this visit include the listed provider and patient and any and all parties involved. The visit was conducted today via telephone. Brynleigh was unable to use realtime audiovisual technology today and the telehealth visit was conducted via telephone. I spent > than 50% of the 15 minute visit on counseling as documented in the note.  HPI:   Chief Complaint: OBESITY Vinessa is here to discuss her progress with her obesity treatment plan. She is on the Category 2 plan and is following her eating plan approximately 75 % of the time. She states she is walking some for exercise. Bernelda feels she is doing well maintaining her weight. She is getting ready to go on a two week vacation and she would like to discuss travel eating strategies. We were unable to weigh the patient today for this TeleHealth visit. She feels as if she has maintained weight (weight not reported) since her last visit. She has lost 11 lbs since starting treatment with [place].  Pre-Diabetes Leona has a diagnosis of prediabetes based on her elevated Hgb A1c and was informed this puts her at greater risk of developing diabetes. She is working on diet and she states she is doing better with decreasing her simple carbohydrates. Sherylyn continues to work on diet and exercise to decrease the risk of diabetes. Her last A1c was at 6.4 Teshara is due for labs soon. She is not taking metformin currently. She denies nausea or hypoglycemia.  ASSESSMENT AND PLAN:  Prediabetes  Class 2 severe obesity with serious comorbidity and body mass index (BMI) of 39.0 to 39.9 in adult, unspecified obesity type [facility])  PLAN:  Pre-Diabetes Bostyn will continue to work on weight loss, exercise, and decreasing simple  carbohydrates in her diet to help decrease the risk of diabetes. She was informed that eating too many simple carbohydrates or too many calories at one sitting increases the likelihood of GI side effects. We will plan to recheck labs at the next in-office visit in 1 month. Shaquanda agreed to follow up with [place] as directed to monitor her progress.  Obesity Lilliana is currently in the action stage of change. As such, her goal is to continue with weight loss efforts She has agreed to portion control better and make smarter food choices, such as increase vegetables and decrease simple carbohydrates  Salam has been instructed to work up to a goal of 150 minutes of combined cardio and strengthening exercise per week for weight loss and overall health benefits. We discussed the following Behavioral Modification Strategies today: travel eating strategies and celebration eating strategies  Donah has agreed to follow up with our clinic in 2 weeks. She was informed of the importance of frequent follow up visits to maximize her success with intensive lifestyle modifications for her multiple health conditions.  ALLERGIES: No Known Allergies  MEDICATIONS: Current Outpatient Medications on File Prior to Visit  Medication Sig Dispense Refill  . amLODipine-benazepril (LOTREL) 5-40 MG per capsule TAKE 1 CAPSULE BY MOUTH DAILY. 90 capsule 0  . ELIQUIS 5 MG TABS tablet TAKE 1 TABLET (5 MG TOTAL) BY MOUTH 2 (TWO) TIMES DAILY. 180 tablet 1  . metoprolol tartrate (LOPRESSOR) 25 MG tablet TAKE 1 TABLET BY MOUTH TWO TIMES DAILY 180 tablet 1  . Vitamin  D, Ergocalciferol, (DRISDOL) 1.25 MG (50000 UT) CAPS capsule Take 1 capsule (50,000 Units total) by mouth every 7 (seven) days. 4 capsule 0   No current facility-administered medications on file prior to visit.     PAST MEDICAL HISTORY: Past Medical History:  Diagnosis Date  . Dyspnea   . GERD (gastroesophageal reflux disease)   . Hypertension   . Joint pain   . Knee  pain   . Lower extremity edema     PAST SURGICAL HISTORY: Past Surgical History:  Procedure Laterality Date  . CARDIOVERSION N/A 03/21/2018   Procedure: CARDIOVERSION;  Surgeon: Sueanne Margarita, MD;  Location: [facility] ENDOSCOPY;  Service: Cardiovascular;  Laterality: N/A;  . CARDIOVERSION N/A 03/31/2018   Procedure: CARDIOVERSION;  Surgeon: Constance Haw, MD;  Location: [place];  Service: Cardiovascular;  Laterality: N/A;    SOCIAL HISTORY: Social History   Tobacco Use  . Smoking status: Never Smoker  . Smokeless tobacco: Never Used  Substance Use Topics  . Alcohol use: Yes    Alcohol/week: 9.0 standard drinks    Types: 7 Standard drinks or equivalent, 2 Shots of liquor per week  . Drug use: No    FAMILY HISTORY: Family History  Problem Relation Age of Onset  . Diabetes Mother   . Hypertension Mother   . Atrial fibrillation Mother   . Diabetes Father   . Hypertension Father   . Heart disease Father        multiple PCI & CABGx3  . Bladder Cancer Father   . Healthy Sister   . Diabetes Brother   . Heart attack Brother        died in 05/04/2016    ROS: Review of Systems  Constitutional: Negative for weight loss.  Gastrointestinal: Negative for nausea.  Endo/Heme/Allergies:       Negative for hypoglycemia    PHYSICAL EXAM: Pt in no acute distress  RECENT LABS AND TESTS: BMET    Component Value Date/Time   NA 141 03/21/2018 0853   NA 141 02/24/2018 1508   K 4.0 03/21/2018 0853   CL 102 02/24/2018 1508   CO2 21 02/24/2018 1508   GLUCOSE 121 (H) 03/21/2018 0853   BUN 12 02/24/2018 1508   CREATININE 0.86 02/24/2018 1508   CALCIUM 9.2 02/24/2018 1508   GFRNONAA 75 02/24/2018 1508   GFRAA 87 02/24/2018 1508   Lab Results  Component Value Date   HGBA1C 6.2 (H) 02/24/2018   HGBA1C 6.4 (H) 05/08/2016   Lab Results  Component Value Date   INSULIN 10.7 02/24/2018   CBC    Component Value Date/Time   WBC 7.5 02/24/2018 1508   RBC 4.85 02/24/2018 1508    HGB 13.9 03/21/2018 0853   HGB 14.5 02/24/2018 1508   HCT 41.0 03/21/2018 0853   HCT 43.7 02/24/2018 1508   PLT 273 05/08/2016 0829   MCV 90 02/24/2018 1508   MCH 29.9 02/24/2018 1508   MCHC 33.2 02/24/2018 1508   RDW 12.1 02/24/2018 1508   LYMPHSABS 1.7 02/24/2018 1508   EOSABS 0.1 02/24/2018 1508   BASOSABS 0.1 02/24/2018 1508   Iron/TIBC/Ferritin/ %Sat No results found for: IRON, TIBC, FERRITIN, IRONPCTSAT Lipid Panel     Component Value Date/Time   CHOL 143 02/24/2018 1508   TRIG 100 02/24/2018 1508   HDL 52 02/24/2018 1508   CHOLHDL 2.8 05/08/2016 0829   LDLCALC 71 02/24/2018 1508   Hepatic Function Panel     Component Value Date/Time   PROT 6.6  02/24/2018 1508   ALBUMIN 4.2 02/24/2018 1508   AST 15 02/24/2018 1508   ALT 15 02/24/2018 1508   ALKPHOS 84 02/24/2018 1508   BILITOT 0.6 02/24/2018 1508      Component Value Date/Time   TSH 1.410 02/24/2018 1508   TSH 1.020 05/08/2016 0829     Ref. Range 02/24/2018 15:08  Vitamin D, 25-Hydroxy Latest Ref Range: 30.0 - 100.0 ng/mL 20.7 (L)    I, Doreene Nest, am acting as transcriptionist for Dennard Nip, MD I have reviewed the above documentation for accuracy and completeness, and I agree with the above. -Dennard Nip, MD

## 2018-10-13 ENCOUNTER — Ambulatory Visit (INDEPENDENT_AMBULATORY_CARE_PROVIDER_SITE_OTHER): Payer: 59 | Admitting: Family Medicine

## 2018-10-13 ENCOUNTER — Other Ambulatory Visit: Payer: Self-pay

## 2018-10-13 ENCOUNTER — Encounter (INDEPENDENT_AMBULATORY_CARE_PROVIDER_SITE_OTHER): Payer: Self-pay | Admitting: Family Medicine

## 2018-10-13 DIAGNOSIS — Z6839 Body mass index (BMI) 39.0-39.9, adult: Secondary | ICD-10-CM | POA: Diagnosis not present

## 2018-10-13 DIAGNOSIS — E559 Vitamin D deficiency, unspecified: Secondary | ICD-10-CM

## 2018-10-13 MED ORDER — VITAMIN D (ERGOCALCIFEROL) 1.25 MG (50000 UNIT) PO CAPS: 50000.0000 [IU] | ORAL_CAPSULE | ORAL | 0 refills | Status: DC

## 2018-10-17 NOTE — Progress Notes (Signed)
Office: [phone]  /  Fax: [phone] TeleHealth Visit:  Tonya Nicholson has verbally consented to this TeleHealth visit today. The patient is located at home, the provider is located at the [employer] [facility] office. The participants in this visit include the listed provider and patient. Tonya Nicholson was unable to use realtime audiovisual technology today and the telehealth visit was conducted via telephone.   HPI:   Chief Complaint: OBESITY Tonya Nicholson is here to discuss her progress with her obesity treatment plan. She is on the  follow the Category 2 plan and is following her eating plan approximately 75 % of the time. She states she is exercising by being more mindful of steps.  Tonya Nicholson continues to do well maintaining her weight. She is not actually following her plan closely. She is mostly trying to portion control and make smarter choices which is a good maintenance plan. She has been on vacation and still ate mindfully.  We were unable to weigh the patient today for this TeleHealth visit. She feels as if she has maintained weight since her last visit. She has lost 11 lbs since starting treatment with [place].  Vitamin D deficiency Tonya Nicholson has a diagnosis of vitamin D deficiency. She is currently taking vit D and denies nausea, vomiting or muscle weakness.  ASSESSMENT AND PLAN:  Vitamin D deficiency - Plan: Vitamin D, Ergocalciferol, (DRISDOL) 1.25 MG (50000 UT) CAPS capsule  Class 2 severe obesity with serious comorbidity and body mass index (BMI) of 39.0 to 39.9 in adult, unspecified obesity type ([place])  PLAN: Vitamin D Deficiency Tonya Nicholson was informed that low vitamin D levels contributes to fatigue and are associated with obesity, breast, and colon cancer. She agrees to continue to take prescription Vit D @50 ,000 IU every week #4 with no refills and will follow up for routine testing of vitamin D, at least 2-3 times per year. She was informed of the risk of over-replacement of vitamin D and  agrees to not increase her dose unless she discusses this with [place] first. Agrees to follow up with our clinic as directed.   Obesity Tonya Nicholson is currently in the action stage of change. As such, her goal is to continue with weight loss efforts She has agreed to follow the Category 2 plan Tonya Nicholson has been instructed to work up to a goal of 150 minutes of combined cardio and strengthening exercise per week for weight loss and overall health benefits. We discussed the following Behavioral Modification Strategies today: work on meal planning and easy cooking plans and travel eating strategies.    Tonya Nicholson has agreed to follow up with our clinic in 3 weeks. She was informed of the importance of frequent follow up visits to maximize her success with intensive lifestyle modifications for her multiple health conditions.  ALLERGIES: No Known Allergies  MEDICATIONS: Current Outpatient Medications on File Prior to Visit  Medication Sig Dispense Refill  . amLODipine-benazepril (LOTREL) 5-40 MG per capsule TAKE 1 CAPSULE BY MOUTH DAILY. 90 capsule 0  . ELIQUIS 5 MG TABS tablet TAKE 1 TABLET (5 MG TOTAL) BY MOUTH 2 (TWO) TIMES DAILY. 180 tablet 1  . metoprolol tartrate (LOPRESSOR) 25 MG tablet TAKE 1 TABLET BY MOUTH TWO TIMES DAILY 180 tablet 1   No current facility-administered medications on file prior to visit.     PAST MEDICAL HISTORY: Past Medical History:  Diagnosis Date  . Dyspnea   . GERD (gastroesophageal reflux disease)   . Hypertension   . Joint pain   .  Knee pain   . Lower extremity edema     PAST SURGICAL HISTORY: Past Surgical History:  Procedure Laterality Date  . CARDIOVERSION N/A 03/21/2018   Procedure: CARDIOVERSION;  Surgeon: Sueanne Margarita, MD;  Location: [facility] ENDOSCOPY;  Service: Cardiovascular;  Laterality: N/A;  . CARDIOVERSION N/A 03/31/2018   Procedure: CARDIOVERSION;  Surgeon: Constance Haw, MD;  Location: [place];  Service: Cardiovascular;  Laterality: N/A;     SOCIAL HISTORY: Social History   Tobacco Use  . Smoking status: Never Smoker  . Smokeless tobacco: Never Used  Substance Use Topics  . Alcohol use: Yes    Alcohol/week: 9.0 standard drinks    Types: 7 Standard drinks or equivalent, 2 Shots of liquor per week  . Drug use: No    FAMILY HISTORY: Family History  Problem Relation Age of Onset  . Diabetes Mother   . Hypertension Mother   . Atrial fibrillation Mother   . Diabetes Father   . Hypertension Father   . Heart disease Father        multiple PCI & CABGx3  . Bladder Cancer Father   . Healthy Sister   . Diabetes Brother   . Heart attack Brother        died in 05-03-2016    ROS: Review of Systems  Gastrointestinal: Negative for nausea and vomiting.  Musculoskeletal:       Negative for muscle weakness    PHYSICAL EXAM: Pt in no acute distress  RECENT LABS AND TESTS: BMET    Component Value Date/Time   NA 141 03/21/2018 0853   NA 141 02/24/2018 1508   K 4.0 03/21/2018 0853   CL 102 02/24/2018 1508   CO2 21 02/24/2018 1508   GLUCOSE 121 (H) 03/21/2018 0853   BUN 12 02/24/2018 1508   CREATININE 0.86 02/24/2018 1508   CALCIUM 9.2 02/24/2018 1508   GFRNONAA 75 02/24/2018 1508   GFRAA 87 02/24/2018 1508   Lab Results  Component Value Date   HGBA1C 6.2 (H) 02/24/2018   HGBA1C 6.4 (H) 05/08/2016   Lab Results  Component Value Date   INSULIN 10.7 02/24/2018   CBC    Component Value Date/Time   WBC 7.5 02/24/2018 1508   RBC 4.85 02/24/2018 1508   HGB 13.9 03/21/2018 0853   HGB 14.5 02/24/2018 1508   HCT 41.0 03/21/2018 0853   HCT 43.7 02/24/2018 1508   PLT 273 05/08/2016 0829   MCV 90 02/24/2018 1508   MCH 29.9 02/24/2018 1508   MCHC 33.2 02/24/2018 1508   RDW 12.1 02/24/2018 1508   LYMPHSABS 1.7 02/24/2018 1508   EOSABS 0.1 02/24/2018 1508   BASOSABS 0.1 02/24/2018 1508   Iron/TIBC/Ferritin/ %Sat No results found for: IRON, TIBC, FERRITIN, IRONPCTSAT Lipid Panel     Component Value Date/Time    CHOL 143 02/24/2018 1508   TRIG 100 02/24/2018 1508   HDL 52 02/24/2018 1508   CHOLHDL 2.8 05/08/2016 0829   LDLCALC 71 02/24/2018 1508   Hepatic Function Panel     Component Value Date/Time   PROT 6.6 02/24/2018 1508   ALBUMIN 4.2 02/24/2018 1508   AST 15 02/24/2018 1508   ALT 15 02/24/2018 1508   ALKPHOS 84 02/24/2018 1508   BILITOT 0.6 02/24/2018 1508      Component Value Date/Time   TSH 1.410 02/24/2018 1508   TSH 1.020 05/08/2016 0829     Ref. Range 02/24/2018 15:08  Vitamin D, 25-Hydroxy Latest Ref Range: 30.0 - 100.0 ng/mL 20.7 (L)  I, Renee Ramus, am acting as transcriptionist for Dennard Nip, MD  I have reviewed the above documentation for accuracy and completeness, and I agree with the above. -Dennard Nip, MD

## 2018-10-31 MED FILL — AMLODIPINE-BENAZEPRIL 5-40: 5-40 | 30 days supply | Qty: 30 | Fill #0

## 2018-11-03 ENCOUNTER — Telehealth (INDEPENDENT_AMBULATORY_CARE_PROVIDER_SITE_OTHER): Payer: 59 | Admitting: Family Medicine

## 2018-11-03 ENCOUNTER — Ambulatory Visit (INDEPENDENT_AMBULATORY_CARE_PROVIDER_SITE_OTHER): Payer: 59 | Admitting: Family Medicine

## 2018-11-03 ENCOUNTER — Other Ambulatory Visit: Payer: Self-pay

## 2018-11-03 DIAGNOSIS — R7303 Prediabetes: Secondary | ICD-10-CM | POA: Diagnosis not present

## 2018-11-03 DIAGNOSIS — Z6839 Body mass index (BMI) 39.0-39.9, adult: Secondary | ICD-10-CM | POA: Diagnosis not present

## 2018-11-03 DIAGNOSIS — E559 Vitamin D deficiency, unspecified: Secondary | ICD-10-CM

## 2018-11-06 NOTE — Progress Notes (Signed)
Office: [phone]  /  Fax: [phone] TeleHealth Visit:  Tonya Nicholson has verbally consented to this TeleHealth visit today. The patient is located at home, the provider is located at the [employer] [facility] office. The participants in this visit include the listed provider and patient. [employer] was unable to use realtime audiovisual technology today and the telehealth visit was conducted via telephone.   HPI:   Chief Complaint: OBESITY Tonya Nicholson is here to discuss her progress with her obesity treatment plan. She is on the Category 2 plan and is following her eating plan approximately 75 % of the time. She states she is exercising 0 minutes 0 times per week. [name] voices she has had to be a little more prepared and prepped to be able to eat on the plan. She says if she doesn't pack or prep food to bring with her to work, she often doesn't eat. Her job is not active.  We were unable to weigh the patient today for this TeleHealth visit. She feels as if she has maintained her weight since her last visit. She has lost 11 lbs since starting treatment with [place].  Pre-Diabetes Tonya Nicholson has a diagnosis of pre-diabetes based on her elevated Hgb A1c and was informed this puts her at greater risk of developing diabetes. Last Hgb A1c was 6.2 on 02/24/2018. She is not taking metformin currently and notes occasional carbohydrate cravings. She continues to work on diet and exercise to decrease risk of diabetes. She denies hypoglycemia.  Vitamin D Deficiency Tonya Nicholson has a diagnosis of vitamin D deficiency. She is currently taking prescription Vit D. She notes fatigue and denies nausea, vomiting or muscle weakness.  ASSESSMENT AND PLAN:  Prediabetes  Vitamin D deficiency  Class 2 severe obesity with serious comorbidity and body mass index (BMI) of 39.0 to 39.9 in adult, unspecified obesity type Tonya Nicholson)  PLAN:  Pre-Diabetes [name] will continue to work on weight loss, exercise, and decreasing simple  carbohydrates in her diet to help decrease the risk of diabetes. We dicussed metformin including benefits and risks. She was informed that eating too many simple carbohydrates or too many calories at one sitting increases the likelihood of GI side effects. We will repeat labs at her next appointment. Tonya Nicholson agrees to follow up with [place] as directed to monitor her progress.  Vitamin D Deficiency Tonya Nicholson was informed that low vitamin D levels contributes to fatigue and are associated with obesity, breast, and colon cancer. [name] agrees to continue taking prescription Vit D 50,000 IU every week, no refill needed. She will follow up for routine testing of vitamin D, at least 2-3 times per year. She was informed of the risk of over-replacement of vitamin D and agrees to not increase her dose unless she discusses this with [place] first. Tonya Nicholson agrees to follow up with our clinic in 3 weeks.  Obesity Tonya Nicholson is currently in the action stage of change. As such, her goal is to continue with weight loss efforts She has agreed to follow the Category 2 plan Tonya Nicholson has been instructed to work up to a goal of 150 minutes of combined cardio and strengthening exercise per week for weight loss and overall health benefits. We discussed the following Behavioral Modification Strategies today: increasing lean protein intake, increasing vegetables and work on meal planning and easy cooking plans, no skipping meals, keeping healthy foods in the home, and planning for success   Tonya Nicholson has agreed to follow up with our clinic in 2 weeks with  Dr. Leafy Ro. She was informed of the importance of frequent follow up visits to maximize her success with intensive lifestyle modifications for her multiple health conditions.  ALLERGIES: No Known Allergies  MEDICATIONS: Current Outpatient Medications on File Prior to Visit  Medication Sig Dispense Refill  . amLODipine-benazepril (LOTREL) 5-40 MG per capsule TAKE 1 CAPSULE BY MOUTH DAILY. 90  capsule 0  . ELIQUIS 5 MG TABS tablet TAKE 1 TABLET (5 MG TOTAL) BY MOUTH 2 (TWO) TIMES DAILY. 180 tablet 1  . metoprolol tartrate (LOPRESSOR) 25 MG tablet TAKE 1 TABLET BY MOUTH TWO TIMES DAILY 180 tablet 1  . Vitamin D, Ergocalciferol, (DRISDOL) 1.25 MG (50000 UT) CAPS capsule Take 1 capsule (50,000 Units total) by mouth every 7 (seven) days. 4 capsule 0   No current facility-administered medications on file prior to visit.     PAST MEDICAL HISTORY: Past Medical History:  Diagnosis Date  . Dyspnea   . GERD (gastroesophageal reflux disease)   . Hypertension   . Joint pain   . Knee pain   . Lower extremity edema     PAST SURGICAL HISTORY: Past Surgical History:  Procedure Laterality Date  . CARDIOVERSION N/A 03/21/2018   Procedure: CARDIOVERSION;  Surgeon: Sueanne Margarita, MD;  Location: [facility] ENDOSCOPY;  Service: Cardiovascular;  Laterality: N/A;  . CARDIOVERSION N/A 03/31/2018   Procedure: CARDIOVERSION;  Surgeon: Constance Haw, MD;  Location: [place];  Service: Cardiovascular;  Laterality: N/A;    SOCIAL HISTORY: Social History   Tobacco Use  . Smoking status: Never Smoker  . Smokeless tobacco: Never Used  Substance Use Topics  . Alcohol use: Yes    Alcohol/week: 9.0 standard drinks    Types: 7 Standard drinks or equivalent, 2 Shots of liquor per week  . Drug use: No    FAMILY HISTORY: Family History  Problem Relation Age of Onset  . Diabetes Mother   . Hypertension Mother   . Atrial fibrillation Mother   . Diabetes Father   . Hypertension Father   . Heart disease Father        multiple PCI & CABGx3  . Bladder Cancer Father   . Healthy Sister   . Diabetes Brother   . Heart attack Brother        died in 2016-05-15    ROS: Review of Systems  Constitutional: Positive for malaise/fatigue. Negative for weight loss.  Gastrointestinal: Negative for nausea and vomiting.  Musculoskeletal:       Negative muscle weakness  Endo/Heme/Allergies:       Negative  hypoglycemia    PHYSICAL EXAM: Pt in no acute distress  RECENT LABS AND TESTS: BMET    Component Value Date/Time   NA 141 03/21/2018 0853   NA 141 02/24/2018 1508   K 4.0 03/21/2018 0853   CL 102 02/24/2018 1508   CO2 21 02/24/2018 1508   GLUCOSE 121 (H) 03/21/2018 0853   BUN 12 02/24/2018 1508   CREATININE 0.86 02/24/2018 1508   CALCIUM 9.2 02/24/2018 1508   GFRNONAA 75 02/24/2018 1508   GFRAA 87 02/24/2018 1508   Lab Results  Component Value Date   HGBA1C 6.2 (H) 02/24/2018   HGBA1C 6.4 (H) 05/08/2016   Lab Results  Component Value Date   INSULIN 10.7 02/24/2018   CBC    Component Value Date/Time   WBC 7.5 02/24/2018 1508   RBC 4.85 02/24/2018 1508   HGB 13.9 03/21/2018 0853   HGB 14.5 02/24/2018 1508   HCT 41.0 03/21/2018  0853   HCT 43.7 02/24/2018 1508   PLT 273 05/08/2016 0829   MCV 90 02/24/2018 1508   MCH 29.9 02/24/2018 1508   MCHC 33.2 02/24/2018 1508   RDW 12.1 02/24/2018 1508   LYMPHSABS 1.7 02/24/2018 1508   EOSABS 0.1 02/24/2018 1508   BASOSABS 0.1 02/24/2018 1508   Iron/TIBC/Ferritin/ %Sat No results found for: IRON, TIBC, FERRITIN, IRONPCTSAT Lipid Panel     Component Value Date/Time   CHOL 143 02/24/2018 1508   TRIG 100 02/24/2018 1508   HDL 52 02/24/2018 1508   CHOLHDL 2.8 05/08/2016 0829   LDLCALC 71 02/24/2018 1508   Hepatic Function Panel     Component Value Date/Time   PROT 6.6 02/24/2018 1508   ALBUMIN 4.2 02/24/2018 1508   AST 15 02/24/2018 1508   ALT 15 02/24/2018 1508   ALKPHOS 84 02/24/2018 1508   BILITOT 0.6 02/24/2018 1508      Component Value Date/Time   TSH 1.410 02/24/2018 1508   TSH 1.020 05/08/2016 0829      I, Trixie Dredge, am acting as transcriptionist for Ilene Qua, MD   I have reviewed the above documentation for accuracy and completeness, and I agree with the above. - Ilene Qua, MD

## 2018-11-24 ENCOUNTER — Encounter (INDEPENDENT_AMBULATORY_CARE_PROVIDER_SITE_OTHER): Payer: Self-pay | Admitting: Family Medicine

## 2018-11-24 ENCOUNTER — Other Ambulatory Visit: Payer: Self-pay

## 2018-11-24 ENCOUNTER — Ambulatory Visit (INDEPENDENT_AMBULATORY_CARE_PROVIDER_SITE_OTHER): Payer: 59 | Admitting: Family Medicine

## 2018-11-24 VITALS — BP 108/73 | HR 63 | Temp 97.7°F | Ht 67.0 in | Wt 258.0 lb

## 2018-11-24 DIAGNOSIS — E559 Vitamin D deficiency, unspecified: Secondary | ICD-10-CM | POA: Diagnosis not present

## 2018-11-24 DIAGNOSIS — Z6841 Body Mass Index (BMI) 40.0 and over, adult: Secondary | ICD-10-CM | POA: Diagnosis not present

## 2018-11-24 DIAGNOSIS — F3289 Other specified depressive episodes: Secondary | ICD-10-CM

## 2018-11-24 DIAGNOSIS — Z9189 Other specified personal risk factors, not elsewhere classified: Secondary | ICD-10-CM

## 2018-11-24 MED ORDER — VITAMIN D (ERGOCALCIFEROL) 1.25 MG (50000 UNIT) PO CAPS: 50000.0000 [IU] | ORAL_CAPSULE | ORAL | 0 refills | Status: DC

## 2018-11-24 MED ORDER — BUPROPION HCL ER (SR) 150 MG PO TB12: 150.0000 mg | ORAL_TABLET | Freq: Every day | ORAL | 0 refills | Status: DC

## 2018-11-24 MED FILL — AMLODIPINE-BENAZEPRIL 5-40: 5-40 | 30 days supply | Qty: 30 | Fill #0

## 2018-11-24 MED FILL — VIT D2 1.25 MG (50,000 UNIT: 1.25 MG | 28 days supply | Qty: 4 | Fill #0

## 2018-11-24 MED FILL — BUPROPION HCL SR 150 MG TAB: 150 | 30 days supply | Qty: 30 | Fill #0

## 2018-11-24 NOTE — Progress Notes (Signed)
Office: [phone]  /  Fax: [phone]   HPI:   Chief Complaint: OBESITY Tonya Nicholson is here to discuss her progress with her obesity treatment plan. She is on the  follow the Category 2 plan and is following her eating plan approximately 75 % of the time. She states she is exercising by walking for 30 minutes 2 times per week. Tonya Nicholson's last in office visit approximately 7 months ago. She has gained minimal weight due to COVID-19 but is ready to get back on track.  Her weight is 258 lb (117 kg) today and has had a weight gain of 6 pounds over a period of 7 months since her last visit. She has lost 5 lbs since starting treatment with [place].  Vitamin D deficiency Tonya Nicholson has a diagnosis of vitamin D deficiency. She is currently taking vit D and denies nausea, vomiting or muscle weakness.  At risk for cardiovascular disease Tonya Nicholson is at a higher than average risk for cardiovascular disease due to obesity. She currently denies any chest pain.  Depression with emotional eating behaviors Tonya Nicholson is struggling with emotional eating and using food for comfort to the extent that it is negatively impacting her health. She often snacks when she is not hungry. Tonya Nicholson sometimes feels she is out of control and then feels guilty that she made poor food choices. She has been working on behavior modification techniques to help reduce her emotional eating and has been somewhat successful. She shows no sign of suicidal or homicidal ideations. She is struggling with cravings and comfort eating since the pandemic.   Depression screen PHQ 2/9 02/24/2018  Decreased Interest 2  Down, Depressed, Hopeless 1  PHQ - 2 Score 3  Altered sleeping 1  Tired, decreased energy 1  Change in appetite 1  Feeling bad or failure about yourself  2  Trouble concentrating 0  Moving slowly or fidgety/restless 0  Suicidal thoughts 0  PHQ-9 Score 8  Difficult doing work/chores Somewhat difficult     ASSESSMENT AND PLAN:  Vitamin D  deficiency - Plan: Vitamin D, Ergocalciferol, (DRISDOL) 1.25 MG (50000 UT) CAPS capsule  Other depression - with emotional eating - Plan: buPROPion (WELLBUTRIN SR) 150 MG 12 hr tablet  At risk for heart disease  Class 3 severe obesity with serious comorbidity and body mass index (BMI) of 40.0 to 44.9 in adult, unspecified obesity type (HCC)  PLAN: Vitamin D Deficiency Tonya Nicholson was informed that low vitamin D levels contributes to fatigue and are associated with obesity, breast, and colon cancer. She agrees to continue to take prescription Vit D @50 ,000 IU every week #4 with no refills and will follow up for routine testing of vitamin D, at least 2-3 times per year. She was informed of the risk of over-replacement of vitamin D and agrees to not increase her dose unless she discusses this with [place] first. Agrees to follow up with our clinic as directed. We will repeat labs today.   Cardiovascular risk counseling Tonya Nicholson was given extended (15 minutes) coronary artery disease prevention counseling today. She is 58 y.o. female and has risk factors for heart disease including obesity. We discussed intensive lifestyle modifications today with an emphasis on specific weight loss instructions and strategies. Pt was also informed of the importance of increasing exercise and decreasing saturated fats to help prevent heart disease.  Depression with Emotional Eating Behaviors We discussed behavior modification techniques today to help Tonya Nicholson deal with her emotional eating and depression. She has agreed to take Wellbutrin  SR 150 mg qd #30 with no refills and agreed to follow up as directed.  Obesity Tonya Nicholson is currently in the action stage of change. As such, her goal is to continue with weight loss efforts She has agreed to follow the Category 2 plan Tonya Nicholson has been instructed to work up to a goal of 150 minutes of combined cardio and strengthening exercise per week for weight loss and overall health benefits. We  discussed the following Behavioral Modification Strategies today: work on meal planning and easy cooking plans and emotional eating strategies   Tonya Nicholson has agreed to follow up with our clinic in 2-3 weeks. She was informed of the importance of frequent follow up visits to maximize her success with intensive lifestyle modifications for her multiple health conditions.  ALLERGIES: No Known Allergies  MEDICATIONS: Current Outpatient Medications on File Prior to Visit  Medication Sig Dispense Refill  . amLODipine-benazepril (LOTREL) 5-40 MG per capsule TAKE 1 CAPSULE BY MOUTH DAILY. 90 capsule 0  . ELIQUIS 5 MG TABS tablet TAKE 1 TABLET (5 MG TOTAL) BY MOUTH 2 (TWO) TIMES DAILY. 180 tablet 1  . metoprolol tartrate (LOPRESSOR) 25 MG tablet TAKE 1 TABLET BY MOUTH TWO TIMES DAILY 180 tablet 1  . Vitamin D, Ergocalciferol, (DRISDOL) 1.25 MG (50000 UT) CAPS capsule Take 1 capsule (50,000 Units total) by mouth every 7 (seven) days. 4 capsule 0   No current facility-administered medications on file prior to visit.     PAST MEDICAL HISTORY: Past Medical History:  Diagnosis Date  . Dyspnea   . GERD (gastroesophageal reflux disease)   . Hypertension   . Joint pain   . Knee pain   . Lower extremity edema     PAST SURGICAL HISTORY: Past Surgical History:  Procedure Laterality Date  . CARDIOVERSION N/A 03/21/2018   Procedure: CARDIOVERSION;  Surgeon: Sueanne Margarita, MD;  Location: [facility] ENDOSCOPY;  Service: Cardiovascular;  Laterality: N/A;  . CARDIOVERSION N/A 03/31/2018   Procedure: CARDIOVERSION;  Surgeon: Constance Haw, MD;  Location: [facility];  Service: Cardiovascular;  Laterality: N/A;    SOCIAL HISTORY: Social History   Tobacco Use  . Smoking status: Never Smoker  . Smokeless tobacco: Never Used  Substance Use Topics  . Alcohol use: Yes    Alcohol/week: 9.0 standard drinks    Types: 7 Standard drinks or equivalent, 2 Shots of liquor per week  . Drug use: No    FAMILY  HISTORY: Family History  Problem Relation Age of Onset  . Diabetes Mother   . Hypertension Mother   . Atrial fibrillation Mother   . Diabetes Father   . Hypertension Father   . Heart disease Father        multiple PCI & CABGx3  . Bladder Cancer Father   . Healthy Sister   . Diabetes Brother   . Heart attack Brother        died in 04-30-16    ROS: Review of Systems  Constitutional: Negative for weight loss.  Cardiovascular: Negative for chest pain.  Gastrointestinal: Negative for nausea and vomiting.  Musculoskeletal:       Negative for muscle weakness  Psychiatric/Behavioral: Positive for depression. Negative for suicidal ideas.    PHYSICAL EXAM: Blood pressure 108/73, pulse 63, temperature 97.7 F (36.5 C), temperature source Oral, height 5\' 7"  (1.702 m), weight 258 lb (117 kg), last menstrual period 04/09/2016, SpO2 98 %. Body mass index is 40.41 kg/m. Physical Exam Vitals signs reviewed.  Constitutional:  Appearance: Normal appearance. She is obese.  HENT:     Head: Normocephalic.     Nose: Nose normal.  Neck:     Musculoskeletal: Normal range of motion.  Cardiovascular:     Rate and Rhythm: Normal rate.  Pulmonary:     Effort: Pulmonary effort is normal.  Musculoskeletal: Normal range of motion.  Skin:    General: Skin is warm and dry.  Neurological:     Mental Status: She is alert and oriented to person, place, and time.  Psychiatric:        Mood and Affect: Mood normal.        Behavior: Behavior normal.     RECENT LABS AND TESTS: BMET    Component Value Date/Time   NA 141 03/21/2018 0853   NA 141 02/24/2018 1508   K 4.0 03/21/2018 0853   CL 102 02/24/2018 1508   CO2 21 02/24/2018 1508   GLUCOSE 121 (H) 03/21/2018 0853   BUN 12 02/24/2018 1508   CREATININE 0.86 02/24/2018 1508   CALCIUM 9.2 02/24/2018 1508   GFRNONAA 75 02/24/2018 1508   GFRAA 87 02/24/2018 1508   Lab Results  Component Value Date   HGBA1C 6.2 (H) 02/24/2018   HGBA1C 6.4  (H) 05/08/2016   Lab Results  Component Value Date   INSULIN 10.7 02/24/2018   CBC    Component Value Date/Time   WBC 7.5 02/24/2018 1508   RBC 4.85 02/24/2018 1508   HGB 13.9 03/21/2018 0853   HGB 14.5 02/24/2018 1508   HCT 41.0 03/21/2018 0853   HCT 43.7 02/24/2018 1508   PLT 273 05/08/2016 0829   MCV 90 02/24/2018 1508   MCH 29.9 02/24/2018 1508   MCHC 33.2 02/24/2018 1508   RDW 12.1 02/24/2018 1508   LYMPHSABS 1.7 02/24/2018 1508   EOSABS 0.1 02/24/2018 1508   BASOSABS 0.1 02/24/2018 1508   Iron/TIBC/Ferritin/ %Sat No results found for: IRON, TIBC, FERRITIN, IRONPCTSAT Lipid Panel     Component Value Date/Time   CHOL 143 02/24/2018 1508   TRIG 100 02/24/2018 1508   HDL 52 02/24/2018 1508   CHOLHDL 2.8 05/08/2016 0829   LDLCALC 71 02/24/2018 1508   Hepatic Function Panel     Component Value Date/Time   PROT 6.6 02/24/2018 1508   ALBUMIN 4.2 02/24/2018 1508   AST 15 02/24/2018 1508   ALT 15 02/24/2018 1508   ALKPHOS 84 02/24/2018 1508   BILITOT 0.6 02/24/2018 1508      Component Value Date/Time   TSH 1.410 02/24/2018 1508   TSH 1.020 05/08/2016 0829      OBESITY BEHAVIORAL INTERVENTION VISIT  Today's visit was # 15   Starting weight: 263 lbs Starting date: 02/24/18 Today's weight : Weight: 258 lb (117 kg)  Today's date: 11/24/2018 Total lbs lost to date: 5 lbs At least 15 minutes were spent on discussing the following behavioral intervention visit.   ASK: We discussed the diagnosis of obesity with Tonya Nicholson today and Tonya Nicholson agreed to give [place] permission to discuss obesity behavioral modification therapy today.  ASSESS: Tonya Nicholson has the diagnosis of obesity and her BMI today is 63.4 Tonya Nicholson is in the action stage of change   ADVISE: Tonya Nicholson was educated on the multiple health risks of obesity as well as the benefit of weight loss to improve her health. She was advised of the need for long term treatment and the importance of lifestyle  modifications to improve her current health and to decrease her risk of  future health problems.  AGREE: Multiple dietary modification options and treatment options were discussed and  Tonya Nicholson agreed to follow the recommendations documented in the above note.  ARRANGE: Tonya Nicholson was educated on the importance of frequent visits to treat obesity as outlined per CMS and USPSTF guidelines and agreed to schedule her next follow up appointment today.  I, Renee Ramus, am acting as transcriptionist for Dennard Nip, MD   I have reviewed the above documentation for accuracy and completeness, and I agree with the above. -Dennard Nip, MD

## 2018-12-14 ENCOUNTER — Telehealth (INDEPENDENT_AMBULATORY_CARE_PROVIDER_SITE_OTHER): Payer: 59 | Admitting: Family Medicine

## 2018-12-14 ENCOUNTER — Encounter (INDEPENDENT_AMBULATORY_CARE_PROVIDER_SITE_OTHER): Payer: Self-pay | Admitting: Family Medicine

## 2018-12-14 DIAGNOSIS — E559 Vitamin D deficiency, unspecified: Secondary | ICD-10-CM

## 2018-12-14 DIAGNOSIS — Z6841 Body Mass Index (BMI) 40.0 and over, adult: Secondary | ICD-10-CM | POA: Diagnosis not present

## 2018-12-14 DIAGNOSIS — F3289 Other specified depressive episodes: Secondary | ICD-10-CM | POA: Diagnosis not present

## 2018-12-14 MED ORDER — BUPROPION HCL ER (SR) 150 MG PO TB12: 150.0000 mg | ORAL_TABLET | Freq: Every day | ORAL | 0 refills | Status: DC

## 2018-12-14 MED ORDER — VITAMIN D (ERGOCALCIFEROL) 1.25 MG (50000 UNIT) PO CAPS: 50000.0000 [IU] | ORAL_CAPSULE | ORAL | 0 refills | Status: DC

## 2018-12-15 ENCOUNTER — Ambulatory Visit (INDEPENDENT_AMBULATORY_CARE_PROVIDER_SITE_OTHER): Payer: 59 | Admitting: Family Medicine

## 2018-12-16 MED FILL — VIT D2 1.25 MG (50,000 UNIT: 1.25 MG | 28 days supply | Qty: 4 | Fill #0

## 2018-12-19 ENCOUNTER — Other Ambulatory Visit: Payer: Self-pay | Admitting: Cardiology

## 2018-12-19 MED FILL — METOPROLOL TARTRATE 25 MG T: 25 | 90 days supply | Qty: 180 | Fill #0

## 2018-12-19 MED FILL — BUPROPION HCL SR 150 MG TAB: 150 | 30 days supply | Qty: 30 | Fill #0

## 2018-12-19 MED FILL — ELIQUIS 5 MG TABLET: 5 | 90 days supply | Qty: 180 | Fill #1

## 2018-12-19 NOTE — Progress Notes (Signed)
Office: [phone]  /  Fax: [phone] TeleHealth Visit:  [name] has verbally consented to this TeleHealth visit today. The patient is located at home, the provider is located at the [facility] office. The participants in this visit include the listed provider and patient. The visit was conducted today via face time.  HPI:   Chief Complaint: OBESITY Tonya Nicholson is here to discuss her progress with her obesity treatment plan. She is on the Category 2 plan and is following her eating plan approximately 95 % of the time. She states she is walking for 30 minutes 3 times per week. Hidaya has done well getting back on track with her diet. She feels she has lost another 4-5 lbs since our last visit, and overall feels better. She has questions about Thanksgiving strategies this year. She states her weight is 253 lbs today. We were unable to weigh the patient today for this TeleHealth visit. She feels as if she has lost 4-5 lbs since her last visit. She has lost 5-10 lbs since starting treatment with [place].  Vitamin D Deficiency Tonya Nicholson has a diagnosis of vitamin D deficiency. She stable on prescription Vit D and denies nausea, vomiting or muscle weakness.  Depression with Emotional Eating Behaviors Tonya Nicholson started Wellbutrin and feels it has helped with decreasing emotional eating, and has improved her mood. She denies insomnia or palpitations. Tonya Nicholson struggles with emotional eating and using food for comfort to the extent that it is negatively impacting her health. She often snacks when she is not hungry. Tonya Nicholson sometimes feels she is out of control and then feels guilty that she made poor food choices. She has been working on behavior modification techniques to help reduce her emotional eating and has been somewhat successful. She shows no sign of suicidal or homicidal ideations.  Depression screen PHQ 2/9 02/24/2018  Decreased Interest 2  Down, Depressed, Hopeless 1  PHQ - 2 Score 3   Altered sleeping 1  Tired, decreased energy 1  Change in appetite 1  Feeling bad or failure about yourself  2  Trouble concentrating 0  Moving slowly or fidgety/restless 0  Suicidal thoughts 0  PHQ-9 Score 8  Difficult doing work/chores Somewhat difficult    ASSESSMENT AND PLAN:  Vitamin D deficiency - Plan: Vitamin D, Ergocalciferol, (DRISDOL) 1.25 MG (50000 UT) CAPS capsule  Other depression - with emotional eating - Plan: buPROPion (WELLBUTRIN SR) 150 MG 12 hr tablet  Class 3 severe obesity with serious comorbidity and body mass index (BMI) of 40.0 to 44.9 in adult, unspecified obesity type (HCC)  PLAN:  Vitamin D Deficiency Tonya Nicholson was informed that low vitamin D levels contributes to fatigue and are associated with obesity, breast, and colon cancer. Tonya Nicholson agrees to continue taking prescription Vit D 50,000 IU every week #4 and we will refill for 1 month. She will follow up for routine testing of vitamin D, at least 2-3 times per year. She was informed of the risk of over-replacement of vitamin D and agrees to not increase her dose unless she discusses this with [place] first. Tonya Nicholson agrees to follow up with our clinic in 3 weeks.  Depression with Emotional Eating Behaviors We discussed behavior modification techniques today to help [name] deal with her emotional eating and depression. Tonya Nicholson agrees to continue taking Wellbutrin SR 150 mg PO daily #30 and we will refill for 1 month. Tonya Nicholson agrees to follow up with our clinic in 3 weeks.  Obesity Tonya Nicholson is currently in  the action stage of change. As such, her goal is to continue with weight loss efforts She has agreed to follow the Category 2 plan Tonya Nicholson has been instructed to work up to a goal of 150 minutes of combined cardio and strengthening exercise per week for weight loss and overall health benefits. We discussed the following Behavioral Modification Strategies today: holiday eating strategies  and emotional eating strategies   Tonya Nicholson  has agreed to follow up with our clinic in 3 weeks with myself. She was informed of the importance of frequent follow up visits to maximize her success with intensive lifestyle modifications for her multiple health conditions.  ALLERGIES: No Known Allergies  MEDICATIONS: Current Outpatient Medications on File Prior to Visit  Medication Sig Dispense Refill  . amLODipine-benazepril (LOTREL) 5-40 MG per capsule TAKE 1 CAPSULE BY MOUTH DAILY. 90 capsule 0  . ELIQUIS 5 MG TABS tablet TAKE 1 TABLET (5 MG TOTAL) BY MOUTH 2 (TWO) TIMES DAILY. 180 tablet 1  . metoprolol tartrate (LOPRESSOR) 25 MG tablet TAKE 1 TABLET BY MOUTH TWO TIMES DAILY 180 tablet 1   No current facility-administered medications on file prior to visit.     PAST MEDICAL HISTORY: Past Medical History:  Diagnosis Date  . Dyspnea   . GERD (gastroesophageal reflux disease)   . Hypertension   . Joint pain   . Knee pain   . Lower extremity edema     PAST SURGICAL HISTORY: Past Surgical History:  Procedure Laterality Date  . CARDIOVERSION N/A 03/21/2018   Procedure: CARDIOVERSION;  Surgeon: Sueanne Margarita, MD;  Location: [facility] ENDOSCOPY;  Service: Cardiovascular;  Laterality: N/A;  . CARDIOVERSION N/A 03/31/2018   Procedure: CARDIOVERSION;  Surgeon: Constance Haw, MD;  Location: [facility];  Service: Cardiovascular;  Laterality: N/A;    SOCIAL HISTORY: Social History   Tobacco Use  . Smoking status: Never Smoker  . Smokeless tobacco: Never Used  Substance Use Topics  . Alcohol use: Yes    Alcohol/week: 9.0 standard drinks    Types: 7 Standard drinks or equivalent, 2 Shots of liquor per week  . Drug use: No    FAMILY HISTORY: Family History  Problem Relation Age of Onset  . Diabetes Mother   . Hypertension Mother   . Atrial fibrillation Mother   . Diabetes Father   . Hypertension Father   . Heart disease Father        multiple PCI & CABGx3  . Bladder Cancer Father   . Healthy Sister   . Diabetes  Brother   . Heart attack Brother        died in 04-21-2016    ROS: Review of Systems  Constitutional: Positive for weight loss.  Cardiovascular: Negative for palpitations.  Gastrointestinal: Negative for nausea and vomiting.  Musculoskeletal:       Negative muscle weakness  Psychiatric/Behavioral: Positive for depression. Negative for suicidal ideas. The patient does not have insomnia.     PHYSICAL EXAM: Pt in no acute distress  RECENT LABS AND TESTS: BMET    Component Value Date/Time   NA 141 03/21/2018 0853   NA 141 02/24/2018 1508   K 4.0 03/21/2018 0853   CL 102 02/24/2018 1508   CO2 21 02/24/2018 1508   GLUCOSE 121 (H) 03/21/2018 0853   BUN 12 02/24/2018 1508   CREATININE 0.86 02/24/2018 1508   CALCIUM 9.2 02/24/2018 1508   GFRNONAA 75 02/24/2018 1508   GFRAA 87 02/24/2018 1508   Lab Results  Component Value  Date   HGBA1C 6.2 (H) 02/24/2018   HGBA1C 6.4 (H) 05/08/2016   Lab Results  Component Value Date   INSULIN 10.7 02/24/2018   CBC    Component Value Date/Time   WBC 7.5 02/24/2018 1508   RBC 4.85 02/24/2018 1508   HGB 13.9 03/21/2018 0853   HGB 14.5 02/24/2018 1508   HCT 41.0 03/21/2018 0853   HCT 43.7 02/24/2018 1508   PLT 273 05/08/2016 0829   MCV 90 02/24/2018 1508   MCH 29.9 02/24/2018 1508   MCHC 33.2 02/24/2018 1508   RDW 12.1 02/24/2018 1508   LYMPHSABS 1.7 02/24/2018 1508   EOSABS 0.1 02/24/2018 1508   BASOSABS 0.1 02/24/2018 1508   Iron/TIBC/Ferritin/ %Sat No results found for: IRON, TIBC, FERRITIN, IRONPCTSAT Lipid Panel     Component Value Date/Time   CHOL 143 02/24/2018 1508   TRIG 100 02/24/2018 1508   HDL 52 02/24/2018 1508   CHOLHDL 2.8 05/08/2016 0829   LDLCALC 71 02/24/2018 1508   Hepatic Function Panel     Component Value Date/Time   PROT 6.6 02/24/2018 1508   ALBUMIN 4.2 02/24/2018 1508   AST 15 02/24/2018 1508   ALT 15 02/24/2018 1508   ALKPHOS 84 02/24/2018 1508   BILITOT 0.6 02/24/2018 1508      Component Value  Date/Time   TSH 1.410 02/24/2018 1508   TSH 1.020 05/08/2016 0829      I, Trixie Dredge, am acting as transcriptionist for Dennard Nip, MD I have reviewed the above documentation for accuracy and completeness, and I agree with the above. -Dennard Nip, MD

## 2018-12-20 MED FILL — AMLODIPINE-BENAZEPRIL 5-40: 5-40 | 30 days supply | Qty: 30 | Fill #1

## 2019-01-03 ENCOUNTER — Other Ambulatory Visit: Payer: Self-pay | Admitting: *Deleted

## 2019-01-03 MED ORDER — APIXABAN 5 MG PO TABS: ORAL_TABLET | ORAL | 2 refills | Status: DC

## 2019-01-03 MED ORDER — METOPROLOL TARTRATE 25 MG PO TABS: 25.0000 mg | ORAL_TABLET | Freq: Two times a day (BID) | ORAL | 2 refills | Status: DC

## 2019-01-05 ENCOUNTER — Encounter (INDEPENDENT_AMBULATORY_CARE_PROVIDER_SITE_OTHER): Payer: Self-pay | Admitting: Family Medicine

## 2019-01-05 ENCOUNTER — Other Ambulatory Visit: Payer: Self-pay

## 2019-01-05 ENCOUNTER — Telehealth (INDEPENDENT_AMBULATORY_CARE_PROVIDER_SITE_OTHER): Payer: 59 | Admitting: Family Medicine

## 2019-01-05 DIAGNOSIS — Z6841 Body Mass Index (BMI) 40.0 and over, adult: Secondary | ICD-10-CM | POA: Diagnosis not present

## 2019-01-05 DIAGNOSIS — F3289 Other specified depressive episodes: Secondary | ICD-10-CM

## 2019-01-05 DIAGNOSIS — R7303 Prediabetes: Secondary | ICD-10-CM | POA: Diagnosis not present

## 2019-01-05 DIAGNOSIS — E559 Vitamin D deficiency, unspecified: Secondary | ICD-10-CM | POA: Diagnosis not present

## 2019-01-05 MED ORDER — VITAMIN D (ERGOCALCIFEROL) 1.25 MG (50000 UNIT) PO CAPS: 50000.0000 [IU] | ORAL_CAPSULE | ORAL | 0 refills | Status: DC

## 2019-01-05 MED ORDER — BUPROPION HCL ER (SR) 150 MG PO TB12: 150.0000 mg | ORAL_TABLET | Freq: Every day | ORAL | 0 refills | Status: DC

## 2019-01-09 NOTE — Progress Notes (Signed)
Office: [phone]  /  Fax: [phone] TeleHealth Visit:  Tonya Nicholson has verbally consented to this TeleHealth visit today. The patient is located at home, the provider is located at the [employer] [facility] office. The participants in this visit include the listed provider and patient. We were unable to use realtime audiovisual technology today and the telehealth visit was conducted via telephone   HPI:  Chief Complaint: OBESITY Tonya Nicholson is here to discuss her progress with her obesity treatment plan. She is on the Category 2 plan and states she is following her eating plan approximately 80 % of the time. She states she is active while helping her daughter move.  Tonya Nicholson feels she has done well with weight loss even over Thanksgiving. She states her weight yesterday was 251 lbs, which is down from her last visit. She is doing better with meal planning and was successful with portion control over Thanksgiving. Her hunger is controlled.   Vitamin D Deficiency Tonya Nicholson has a diagnosis of vitamin D deficiency. Her Vit D level is overdue to be checked. She is at risk of over-replacement. She denies nausea, vomiting or muscle weakness.  Depression with Emotional Eating Behaviors Tonya Nicholson's mood is stable on Wellbutrin. She is doing well with controlling her emotional eating.  Pre-Diabetes Tonya Nicholson has a diagnosis of prediabetes. Her last A1c was 6.2. she is working on diet and weight loss, and she is due for labs. She denies hypoglycemia.  ASSESSMENT AND PLAN:  Vitamin D deficiency - Plan: Vitamin D, Ergocalciferol, (DRISDOL) 1.25 MG (50000 UT) CAPS capsule, Vitamin D (25 hydroxy)  Other depression, with emotional eating  - Plan: buPROPion (WELLBUTRIN SR) 150 MG 12 hr tablet  Prediabetes - Plan: Comprehensive metabolic panel, Hemoglobin A1c, Insulin, random  Class 3 severe obesity with serious comorbidity and body mass index (BMI) of 40.0 to 44.9 in adult, unspecified obesity type  (HCC)  PLAN:  Vitamin D Deficiency Low vitamin D level contributes to fatigue and are associated with obesity, breast, and colon cancer. Tonya Nicholson agrees to continue taking prescription Vit D 50,000 IU every week #4 and we will refill for 1 month. She will follow up for routine testing of vitamin D, at least 2-3 times per year to avoid over-replacement. We will check labs before her next visit. Tonya Nicholson agrees to follow up with our clinic in 3 weeks.  Emotional Eating Behaviors (other depression) Behavior modification techniques were discussed today to help Tonya Nicholson deal with her emotional/non-hunger eating behaviors. Tonya Nicholson agrees to continue taking Wellbutrin SR 150 mg PO daily #30 and we will refill for 1 month. Tonya Nicholson agrees to follow up with our clinic in 3 weeks.  Pre-Diabetes Tonya Nicholson will continue to work on weight loss, diet, exercise, and decreasing simple carbohydrates to help decrease the risk of diabetes. We will check labs and will continue to monitor her progress.  Obesity Tonya Nicholson is currently in the action stage of change. As such, her goal is to continue with weight loss efforts She has agreed to follow the Category 2 plan Tonya Nicholson has been instructed to work up to a goal of 150 minutes of combined cardio and strengthening exercise per week for weight loss and overall health benefits. We discussed the following Behavioral Modification Strategies today: holiday eating strategies    Tonya Nicholson has agreed to follow up with our clinic in 3 weeks with myself. She was informed of the importance of frequent follow up visits to maximize her success with intensive lifestyle modifications for her multiple  health conditions.  ALLERGIES: No Known Allergies  MEDICATIONS: Current Outpatient Medications on File Prior to Visit  Medication Sig Dispense Refill  . amLODipine-benazepril (LOTREL) 5-40 MG per capsule TAKE 1 CAPSULE BY MOUTH DAILY. 90 capsule 0  . apixaban (ELIQUIS) 5 MG TABS tablet TAKE 1 TABLET (5  MG TOTAL) BY MOUTH 2 (TWO) TIMES DAILY. 180 tablet 2  . metoprolol tartrate (LOPRESSOR) 25 MG tablet Take 1 tablet (25 mg total) by mouth 2 (two) times daily. 180 tablet 2   No current facility-administered medications on file prior to visit.    PAST MEDICAL HISTORY: Past Medical History:  Diagnosis Date  . Dyspnea   . GERD (gastroesophageal reflux disease)   . Hypertension   . Joint pain   . Knee pain   . Lower extremity edema     PAST SURGICAL HISTORY: Past Surgical History:  Procedure Laterality Date  . CARDIOVERSION N/A 03/21/2018   Procedure: CARDIOVERSION;  Surgeon: Sueanne Margarita, MD;  Location: [facility] ENDOSCOPY;  Service: Cardiovascular;  Laterality: N/A;  . CARDIOVERSION N/A 03/31/2018   Procedure: CARDIOVERSION;  Surgeon: Constance Haw, MD;  Location: [place];  Service: Cardiovascular;  Laterality: N/A;    SOCIAL HISTORY: Social History   Tobacco Use  . Smoking status: Never Smoker  . Smokeless tobacco: Never Used  Substance Use Topics  . Alcohol use: Yes    Alcohol/week: 9.0 standard drinks    Types: 7 Standard drinks or equivalent, 2 Shots of liquor per week  . Drug use: No    FAMILY HISTORY: Family History  Problem Relation Age of Onset  . Diabetes Mother   . Hypertension Mother   . Atrial fibrillation Mother   . Diabetes Father   . Hypertension Father   . Heart disease Father        multiple PCI & CABGx3  . Bladder Cancer Father   . Healthy Sister   . Diabetes Brother   . Heart attack Brother        died in 04/25/2016    ROS: Review of Systems  Constitutional: Positive for weight loss.  Gastrointestinal: Negative for nausea and vomiting.  Musculoskeletal:       Negative muscle weakness  Endo/Heme/Allergies:       Negative hypoglycemia  Psychiatric/Behavioral: Positive for depression.    PHYSICAL EXAM: Last menstrual period 04/09/2016. There is no height or weight on file to calculate BMI. Physical Exam Vitals reviewed.   Constitutional:      Appearance: Normal appearance. She is obese.  Cardiovascular:     Rate and Rhythm: Normal rate.     Pulses: Normal pulses.  Pulmonary:     Effort: Pulmonary effort is normal.     Breath sounds: Normal breath sounds.  Musculoskeletal:        General: Normal range of motion.  Skin:    General: Skin is warm and dry.  Neurological:     Mental Status: She is alert and oriented to person, place, and time.  Psychiatric:        Mood and Affect: Mood normal.        Behavior: Behavior normal.     RECENT LABS AND TESTS: BMET    Component Value Date/Time   NA 141 03/21/2018 0853   NA 141 02/24/2018 1508   K 4.0 03/21/2018 0853   CL 102 02/24/2018 1508   CO2 21 02/24/2018 1508   GLUCOSE 121 (H) 03/21/2018 0853   BUN 12 02/24/2018 1508   CREATININE 0.86 02/24/2018  1508   CALCIUM 9.2 02/24/2018 1508   GFRNONAA 75 02/24/2018 1508   GFRAA 87 02/24/2018 1508   Lab Results  Component Value Date   HGBA1C 6.2 (H) 02/24/2018   HGBA1C 6.4 (H) 05/08/2016   Lab Results  Component Value Date   INSULIN 10.7 02/24/2018   CBC    Component Value Date/Time   WBC 7.5 02/24/2018 1508   RBC 4.85 02/24/2018 1508   HGB 13.9 03/21/2018 0853   HGB 14.5 02/24/2018 1508   HCT 41.0 03/21/2018 0853   HCT 43.7 02/24/2018 1508   PLT 273 05/08/2016 0829   MCV 90 02/24/2018 1508   MCH 29.9 02/24/2018 1508   MCHC 33.2 02/24/2018 1508   RDW 12.1 02/24/2018 1508   LYMPHSABS 1.7 02/24/2018 1508   EOSABS 0.1 02/24/2018 1508   BASOSABS 0.1 02/24/2018 1508   Iron/TIBC/Ferritin/ %Sat No results found for: IRON, TIBC, FERRITIN, IRONPCTSAT Lipid Panel     Component Value Date/Time   CHOL 143 02/24/2018 1508   TRIG 100 02/24/2018 1508   HDL 52 02/24/2018 1508   CHOLHDL 2.8 05/08/2016 0829   LDLCALC 71 02/24/2018 1508   Hepatic Function Panel     Component Value Date/Time   PROT 6.6 02/24/2018 1508   ALBUMIN 4.2 02/24/2018 1508   AST 15 02/24/2018 1508   ALT 15 02/24/2018  1508   ALKPHOS 84 02/24/2018 1508   BILITOT 0.6 02/24/2018 1508      Component Value Date/Time   TSH 1.410 02/24/2018 1508   TSH 1.020 05/08/2016 0829        I, Trixie Dredge, am acting as transcriptionist for Dennard Nip, MD I have reviewed the above documentation for accuracy and completeness, and I agree with the above. -Dennard Nip, MD

## 2019-01-16 MED FILL — AMLODIPINE-BENAZEPRIL 5-40: 5-40 | 30 days supply | Qty: 30 | Fill #2

## 2019-01-23 ENCOUNTER — Other Ambulatory Visit (INDEPENDENT_AMBULATORY_CARE_PROVIDER_SITE_OTHER): Payer: Self-pay | Admitting: Family Medicine

## 2019-01-23 DIAGNOSIS — E559 Vitamin D deficiency, unspecified: Secondary | ICD-10-CM

## 2019-01-23 DIAGNOSIS — F3289 Other specified depressive episodes: Secondary | ICD-10-CM

## 2019-01-25 ENCOUNTER — Other Ambulatory Visit (INDEPENDENT_AMBULATORY_CARE_PROVIDER_SITE_OTHER): Payer: Self-pay | Admitting: Family Medicine

## 2019-01-25 DIAGNOSIS — F3289 Other specified depressive episodes: Secondary | ICD-10-CM

## 2019-01-26 MED FILL — BUPROPION HCL SR 150 MG TAB: 150 | 30 days supply | Qty: 30 | Fill #0

## 2019-01-26 MED FILL — VIT D2 1.25 MG (50,000 UNIT: 1.25 MG | 28 days supply | Qty: 4 | Fill #0

## 2019-01-31 DIAGNOSIS — E559 Vitamin D deficiency, unspecified: Secondary | ICD-10-CM | POA: Diagnosis not present

## 2019-01-31 DIAGNOSIS — R7303 Prediabetes: Secondary | ICD-10-CM | POA: Diagnosis not present

## 2019-02-01 LAB — COMPREHENSIVE METABOLIC PANEL
ALT: 23 IU/L (ref 0–32)
AST: 19 IU/L (ref 0–40)
Albumin/Globulin Ratio: 1.7 (ref 1.2–2.2)
Albumin: 4.3 g/dL (ref 3.8–4.9)
Alkaline Phosphatase: 91 IU/L (ref 39–117)
BUN/Creatinine Ratio: 13 (ref 9–23)
BUN: 11 mg/dL (ref 6–24)
Bilirubin Total: 0.5 mg/dL (ref 0.0–1.2)
CO2: 22 mmol/L (ref 20–29)
Calcium: 9.3 mg/dL (ref 8.7–10.2)
Chloride: 105 mmol/L (ref 96–106)
Creatinine, Ser: 0.84 mg/dL (ref 0.57–1.00)
GFR calc Af Amer: 89 mL/min/{1.73_m2} (ref >59)
GFR calc non Af Amer: 77 mL/min/{1.73_m2} (ref >59)
Globulin, Total: 2.5 g/dL (ref 1.5–4.5)
Glucose: 110 mg/dL — ABNORMAL HIGH (ref 65–99)
Potassium: 4.1 mmol/L (ref 3.5–5.2)
Sodium: 140 mmol/L (ref 134–144)
Total Protein: 6.8 g/dL (ref 6.0–8.5)

## 2019-02-01 LAB — VITAMIN D 25 HYDROXY (VIT D DEFICIENCY, FRACTURES): Vit D, 25-Hydroxy: 38.1 ng/mL (ref 30.0–100.0)

## 2019-02-01 LAB — HEMOGLOBIN A1C
Est. average glucose Bld gHb Est-mCnc: 117 mg/dL
Hgb A1c MFr Bld: 5.7 % — ABNORMAL HIGH (ref 4.8–5.6)

## 2019-02-01 LAB — INSULIN, RANDOM: INSULIN: 8.2 u[IU]/mL (ref 2.6–24.9)

## 2019-02-02 ENCOUNTER — Telehealth (INDEPENDENT_AMBULATORY_CARE_PROVIDER_SITE_OTHER): Payer: 59 | Admitting: Family Medicine

## 2019-02-02 ENCOUNTER — Encounter (INDEPENDENT_AMBULATORY_CARE_PROVIDER_SITE_OTHER): Payer: Self-pay | Admitting: Family Medicine

## 2019-02-02 ENCOUNTER — Other Ambulatory Visit: Payer: Self-pay

## 2019-02-02 DIAGNOSIS — E559 Vitamin D deficiency, unspecified: Secondary | ICD-10-CM

## 2019-02-02 DIAGNOSIS — Z6841 Body Mass Index (BMI) 40.0 and over, adult: Secondary | ICD-10-CM

## 2019-02-02 DIAGNOSIS — Z9189 Other specified personal risk factors, not elsewhere classified: Secondary | ICD-10-CM

## 2019-02-02 DIAGNOSIS — R7303 Prediabetes: Secondary | ICD-10-CM

## 2019-02-07 NOTE — Progress Notes (Signed)
TeleHealth Visit:  Due to the COVID-19 pandemic, this visit was completed with telemedicine (audio/video) technology to reduce patient and provider exposure as well as to preserve personal protective equipment.   Tonya Nicholson has verbally consented to this TeleHealth visit. The patient is located at home, the provider is located at the [employer] [facility] office. The participants in this visit include the listed provider and patient. Tonya Nicholson was unable to use realtime audiovisual technology today and the telehealth visit was conducted via telephone.  Chief Complaint: OBESITY Tonya Nicholson is here to discuss her progress with her obesity treatment plan along with follow-up of her obesity related diagnoses. Tonya Nicholson is on the Category 2 Plan and states she is following her eating plan approximately 20% of the time. Tonya Nicholson states she is doing 0 minutes 0 times per week.  Today's visit was #: 18 Starting weight: 263 lbs Starting date: 02/24/2018  Interim History: Tonya Nicholson feels she has done well maintaining her weight over the holidays. She did some celebration eating, but she was able to portion control. She is now back on her Category 2 plan. She notes increased work stress and hours.  Subjective:   1. Vitamin D deficiency Tonya Nicholson's Vit D level is improving on Vit D prescription, but level is not yet at goal. She denies nausea, vomiting, or muscle weakness. I discussed labs with the patient today.  2. Pre-diabetes Tonya Nicholson's A1c and fasting insulin have both improved with diet and weight loss. She has lowered her risk of diabetes mellitus with her hard work. She is not on metformin. I discussed labs with the patient today.  3. At risk for heart disease Tonya Nicholson is at a higher than average risk for cardiovascular disease due to obesity. Reviewed: no chest pain on exertion, no dyspnea on exertion, and no swelling of ankles.  Assessment/Plan:   1. Vitamin D deficiency Low Vitamin D level  contributes to fatigue and are associated with obesity, breast, and colon cancer. Flossy agrees to continue taking prescription Vitamin D, the goal is 50 or greater. She will follow-up for routine testing of vitamin D, at least 2-3 times per year to avoid over-replacement. We will continue to monitor.  2. Pre-diabetes Tonya Nicholson will continue to work on diet, exercise, and weight loss, and decreasing simple carbohydrates to help decrease the risk of diabetes.  3. At risk for heart disease Tonya Nicholson was given approximately 15 minutes of coronary artery disease prevention counseling today. She is 59 y.o. female and has risk factors for heart disease including obesity. We discussed intensive lifestyle modifications today with an emphasis on specific weight loss instructions and strategies.   4. Class 3 severe obesity with serious comorbidity and body mass index (BMI) of 40.0 to 44.9 in adult, unspecified obesity type (HCC) Tonya Nicholson is currently in the action stage of change. As such, her goal is to continue with weight loss efforts. She has agreed to on the Category 2 Plan.   We discussed the following exercise goals today: For substantial health benefits, adults should do at least 150 minutes (2 hours and 30 minutes) a week of moderate-intensity, or 75 minutes (1 hour and 15 minutes) a week of vigorous-intensity aerobic physical activity, or an equivalent combination of moderate- and vigorous-intensity aerobic activity. Aerobic activity should be performed in episodes of at least 10 minutes, and preferably, it should be spread throughout the week. Adults should also include muscle-strengthening activities that involve all major muscle groups on 2 or more days  a week.  We discussed the following behavioral modification strategies today: meal planning and cooking strategies.  Tonya Nicholson has agreed to follow-up with our clinic in 2 to 3 weeks. She was informed of the importance of frequent follow-up visits to  maximize her success with intensive lifestyle modifications for her multiple health conditions.  Objective:   VITALS: Per patient if applicable, see vitals. GENERAL: Alert and in no acute distress. CARDIOPULMONARY: No increased WOB. Speaking in clear sentences.  PSYCH: Pleasant and cooperative. Speech normal rate and rhythm. Affect is appropriate. Insight and judgement are appropriate. Attention is focused, linear, and appropriate.  NEURO: Oriented as arrived to appointment on time with no prompting.   Lab Results  Component Value Date   CREATININE 0.84 01/31/2019   BUN 11 01/31/2019   NA 140 01/31/2019   K 4.1 01/31/2019   CL 105 01/31/2019   CO2 22 01/31/2019   Lab Results  Component Value Date   ALT 23 01/31/2019   AST 19 01/31/2019   ALKPHOS 91 01/31/2019   BILITOT 0.5 01/31/2019   Lab Results  Component Value Date   HGBA1C 5.7 (H) 01/31/2019   HGBA1C 6.2 (H) 02/24/2018   HGBA1C 6.4 (H) 05/08/2016   Lab Results  Component Value Date   INSULIN 8.2 01/31/2019   INSULIN 10.7 02/24/2018   Lab Results  Component Value Date   TSH 1.410 02/24/2018   Lab Results  Component Value Date   CHOL 143 02/24/2018   HDL 52 02/24/2018   LDLCALC 71 02/24/2018   TRIG 100 02/24/2018   CHOLHDL 2.8 05/08/2016   Lab Results  Component Value Date   WBC 7.5 02/24/2018   HGB 13.9 03/21/2018   HCT 41.0 03/21/2018   MCV 90 02/24/2018   PLT 273 05/08/2016   No results found for: IRON, TIBC, FERRITIN  Attestation Statements:   Reviewed by clinician on day of visit: allergies, medications, problem list, medical history, surgical history, family history, social history, and previous encounter notes.   I, Trixie Dredge, am acting as transcriptionist for Dennard Nip, MD.  I have reviewed the above documentation for accuracy and completeness, and I agree with the above. - Dennard Nip, MD

## 2019-02-23 ENCOUNTER — Other Ambulatory Visit: Payer: Self-pay

## 2019-02-23 ENCOUNTER — Encounter (INDEPENDENT_AMBULATORY_CARE_PROVIDER_SITE_OTHER): Payer: Self-pay | Admitting: Physician Assistant

## 2019-02-23 ENCOUNTER — Ambulatory Visit (INDEPENDENT_AMBULATORY_CARE_PROVIDER_SITE_OTHER): Payer: 59 | Admitting: Physician Assistant

## 2019-02-23 VITALS — BP 108/69 | HR 56 | Temp 98.1°F | Ht 67.0 in | Wt 252.0 lb

## 2019-02-23 DIAGNOSIS — Z9189 Other specified personal risk factors, not elsewhere classified: Secondary | ICD-10-CM | POA: Diagnosis not present

## 2019-02-23 DIAGNOSIS — F3289 Other specified depressive episodes: Secondary | ICD-10-CM | POA: Diagnosis not present

## 2019-02-23 DIAGNOSIS — I10 Essential (primary) hypertension: Secondary | ICD-10-CM | POA: Diagnosis not present

## 2019-02-23 DIAGNOSIS — Z6839 Body mass index (BMI) 39.0-39.9, adult: Secondary | ICD-10-CM

## 2019-02-23 DIAGNOSIS — E559 Vitamin D deficiency, unspecified: Secondary | ICD-10-CM | POA: Diagnosis not present

## 2019-02-23 MED ORDER — METOPROLOL TARTRATE 25 MG PO TABS: 25.0000 mg | ORAL_TABLET | Freq: Two times a day (BID) | ORAL | 0 refills | Status: DC

## 2019-02-23 MED ORDER — VITAMIN D (ERGOCALCIFEROL) 1.25 MG (50000 UNIT) PO CAPS: 50000.0000 [IU] | ORAL_CAPSULE | ORAL | 0 refills | Status: DC

## 2019-02-23 MED ORDER — BUPROPION HCL ER (SR) 150 MG PO TB12: 150.0000 mg | ORAL_TABLET | Freq: Every day | ORAL | 0 refills | Status: DC

## 2019-02-23 MED FILL — VIT D2 1.25 MG (50,000 UNIT: 1.25 MG | 28 days supply | Qty: 4 | Fill #0

## 2019-02-23 MED FILL — BUPROPION HCL SR 150 MG TAB: 150 | 30 days supply | Qty: 30 | Fill #0

## 2019-02-23 NOTE — Progress Notes (Signed)
Chief Complaint:   OBESITY Tonya Nicholson is here to discuss her progress with her obesity treatment plan along with follow-up of her obesity related diagnoses. Tonya Nicholson is on the Category 2 Plan and states she is following her eating plan approximately 50% of the time. Tonya Nicholson states she is walking for 30 minutes 4 times per week.  Today's visit was #: 63 Starting weight: 263 lbs Starting date: 02/24/2018 Today's weight: 252 lbs Today's date: 02/23/2019 Total lbs lost to date: 11 Total lbs lost since last in-office visit: 6  Interim History: Tonya Nicholson reports that she does not always meal plan prior to work, and therefore doesn't think she is eating all of her food. She is not consistently weighing her protein. She is ready to get back on track.  Subjective:   1. Vitamin D deficiency Tonya Nicholson is on Vit D, and denies nausea, vomiting, or muscle weakness. Last Vit D level was 38.1.  2. Essential hypertension Tonya Nicholson is on metoprolol and amlodipine. She denies chest pain or headaches. Her blood pressure is normal.  3. Other depression, with emotional eating  Tonya Nicholson denies suicidal ideas or homicidal ideas. She reports improvement in cravings until temptations are directly in front of her. She is bupropion.    4. At risk for osteoporosis Tonya Nicholson is at higher risk of osteopenia and osteoporosis due to Vitamin D deficiency.   Assessment/Plan:   1. Vitamin D deficiency Low Vitamin D level contributes to fatigue and are associated with obesity, breast, and colon cancer. We will refill prescription Vitamin D for 1 month and will follow-up for routine testing of Vitamin D, at least 2-3 times per year to avoid over-replacement.   - Vitamin D, Ergocalciferol, (DRISDOL) 1.25 MG (50000 UNIT) CAPS capsule; Take 1 capsule (50,000 Units total) by mouth every 7 (seven) days.  Dispense: 4 capsule; Refill: 0  2. Essential hypertension Tonya Nicholson is working on healthy weight loss and exercise to improve blood pressure  control. We will watch for signs of hypotension as she continues her lifestyle modifications. We will refill metoprolol for 1 month, and will continue to monitor.  - metoprolol tartrate (LOPRESSOR) 25 MG tablet; Take 1 tablet (25 mg total) by mouth 2 (two) times daily.  Dispense: 60 tablet; Refill: 0  3. Other depression, with emotional eating  Behavior modification techniques were discussed today to help Tonya Nicholson deal with her emotional/non-hunger eating behaviors. We will refill bupropion for 1 month. Orders and follow up as documented in patient record.   - buPROPion (WELLBUTRIN SR) 150 MG 12 hr tablet; Take 1 tablet (150 mg total) by mouth daily.  Dispense: 30 tablet; Refill: 0  4. At risk for osteoporosis Tonya Nicholson was given approximately 15 minutes of osteoporosis prevention counseling today. Tonya Nicholson is at risk for osteopenia and osteoporosis due to her Vitamin D deficiency. She was encouraged to take her Vitamin D and follow her higher calcium diet and increase strengthening exercise to help strengthen her bones and decrease her risk of osteopenia and osteoporosis.  Repetitive spaced learning was employed today to elicit superior memory formation and behavioral change.  5. Class 2 severe obesity with serious comorbidity and body mass index (BMI) of 39.0 to 39.9 in adult, unspecified obesity type (HCC) Tonya Nicholson is currently in the action stage of change. As such, her goal is to continue with weight loss efforts. She has agreed to the Category 2 Plan + 100 calories.   Exercise goals: No exercise has been prescribed at this time.  Behavioral modification  strategies: increasing lean protein intake and meal planning and cooking strategies.  Tonya Nicholson has agreed to follow-up with our clinic in 2 weeks. She was informed of the importance of frequent follow-up visits to maximize her success with intensive lifestyle modifications for her multiple health conditions.   Objective:   Blood pressure 108/69, pulse  (!) 56, temperature 98.1 F (36.7 C), temperature source Oral, height 5\' 7"  (1.702 m), weight 252 lb (114.3 kg), last menstrual period 04/09/2016, SpO2 97 %. Body mass index is 39.47 kg/m.  General: Cooperative, alert, well developed, in no acute distress. HEENT: Conjunctivae and lids unremarkable. Cardiovascular: Regular rhythm.  Lungs: Normal work of breathing. Neurologic: No focal deficits.   Lab Results  Component Value Date   CREATININE 0.84 01/31/2019   BUN 11 01/31/2019   NA 140 01/31/2019   K 4.1 01/31/2019   CL 105 01/31/2019   CO2 22 01/31/2019   Lab Results  Component Value Date   ALT 23 01/31/2019   AST 19 01/31/2019   ALKPHOS 91 01/31/2019   BILITOT 0.5 01/31/2019   Lab Results  Component Value Date   HGBA1C 5.7 (H) 01/31/2019   HGBA1C 6.2 (H) 02/24/2018   HGBA1C 6.4 (H) 05/08/2016   Lab Results  Component Value Date   INSULIN 8.2 01/31/2019   INSULIN 10.7 02/24/2018   Lab Results  Component Value Date   TSH 1.410 02/24/2018   Lab Results  Component Value Date   CHOL 143 02/24/2018   HDL 52 02/24/2018   LDLCALC 71 02/24/2018   TRIG 100 02/24/2018   CHOLHDL 2.8 05/08/2016   Lab Results  Component Value Date   WBC 7.5 02/24/2018   HGB 13.9 03/21/2018   HCT 41.0 03/21/2018   MCV 90 02/24/2018   PLT 273 05/08/2016   No results found for: IRON, TIBC, FERRITIN  Attestation Statements:   Reviewed by clinician on day of visit: allergies, medications, problem list, medical history, surgical history, family history, social history, and previous encounter notes.   Wilhemena Durie, am acting as transcriptionist for [employer], PA-C.  I have reviewed the above documentation for accuracy and completeness, and I agree with the above. Abby Potash, PA-C

## 2019-02-27 MED FILL — AMLODIPINE-BENAZEPRIL 5-40: 5-40 | 30 days supply | Qty: 30 | Fill #0

## 2019-03-01 MED FILL — METOPROLOL TARTRATE 25 MG T: 25 | 90 days supply | Qty: 180 | Fill #1

## 2019-03-09 ENCOUNTER — Encounter (INDEPENDENT_AMBULATORY_CARE_PROVIDER_SITE_OTHER): Payer: Self-pay | Admitting: Family Medicine

## 2019-03-09 ENCOUNTER — Other Ambulatory Visit: Payer: Self-pay

## 2019-03-09 ENCOUNTER — Ambulatory Visit (INDEPENDENT_AMBULATORY_CARE_PROVIDER_SITE_OTHER): Payer: 59 | Admitting: Family Medicine

## 2019-03-09 VITALS — BP 112/61 | HR 56 | Temp 98.1°F | Ht 67.0 in | Wt 247.0 lb

## 2019-03-09 DIAGNOSIS — E559 Vitamin D deficiency, unspecified: Secondary | ICD-10-CM

## 2019-03-09 DIAGNOSIS — F3289 Other specified depressive episodes: Secondary | ICD-10-CM | POA: Diagnosis not present

## 2019-03-09 DIAGNOSIS — F329 Major depressive disorder, single episode, unspecified: Secondary | ICD-10-CM | POA: Insufficient documentation

## 2019-03-09 DIAGNOSIS — I10 Essential (primary) hypertension: Secondary | ICD-10-CM | POA: Diagnosis not present

## 2019-03-09 DIAGNOSIS — Z9189 Other specified personal risk factors, not elsewhere classified: Secondary | ICD-10-CM | POA: Diagnosis not present

## 2019-03-09 DIAGNOSIS — Z6838 Body mass index (BMI) 38.0-38.9, adult: Secondary | ICD-10-CM | POA: Diagnosis not present

## 2019-03-09 DIAGNOSIS — F32A Depression, unspecified: Secondary | ICD-10-CM | POA: Insufficient documentation

## 2019-03-09 MED ORDER — BUPROPION HCL ER (SR) 150 MG PO TB12: 150.0000 mg | ORAL_TABLET | Freq: Every day | ORAL | 0 refills | Status: DC

## 2019-03-09 MED ORDER — VITAMIN D (ERGOCALCIFEROL) 1.25 MG (50000 UNIT) PO CAPS: 50000.0000 [IU] | ORAL_CAPSULE | ORAL | 0 refills | Status: DC

## 2019-03-09 MED ORDER — METOPROLOL TARTRATE 25 MG PO TABS: 25.0000 mg | ORAL_TABLET | Freq: Two times a day (BID) | ORAL | 0 refills | Status: DC

## 2019-03-09 NOTE — Progress Notes (Signed)
Chief Complaint:   OBESITY Tonya Nicholson is here to discuss her progress with her obesity treatment plan along with follow-up of her obesity related diagnoses. Tonya Nicholson is on the Category 2 Plan + 100 calories and states she is following her eating plan approximately 80% of the time. Tonya Nicholson states she is doing 0 minutes 0 times per week.  Today's visit was #: 20 Starting weight: 263 lbs Starting date: 02/24/2018 Today's weight: 247 lbs Today's date: 03/09/2019 Total lbs lost to date: 16 Total lbs lost since last in-office visit: 5  Interim History: Tonya Nicholson continues to do well with her eating plan and weight loss. Her hunger is mostly controlled and she is doing well with meal planning. She has not started back to exercise due to working longer hours and bad wether, as well as [place].  Subjective:   1. Vitamin D deficiency Tonya Nicholson is stable on Vit D, and she denies nausea, vomiting, or muscle weakness.  2. Other depression, with emotional eating  Tonya Nicholson's energy appears to be improved, and she has done better avoiding emotional eating.  3. Essential hypertension Tonya Nicholson's blood pressure is well controlled on her medications. She denies lightheadedness or dizziness.  4. At risk for complication associated with hypotension  The patient is at a higher than average risk of hypotension due to weight loss.  Assessment/Plan:   1. Vitamin D deficiency Low Vitamin D level contributes to fatigue and are associated with obesity, breast, and colon cancer. We will refill prescription Vitamin D for 1 month. Tonya Nicholson will follow-up for routine testing of Vitamin D, at least 2-3 times per year to avoid over-replacement.  - Vitamin D, Ergocalciferol, (DRISDOL) 1.25 MG (50000 UNIT) CAPS capsule; Take 1 capsule (50,000 Units total) by mouth every 7 (seven) days.  Dispense: 4 capsule; Refill: 0  2. Other depression, with emotional eating  Behavior modification techniques were discussed today to help Evia deal  with her emotional/non-hunger eating behaviors. We will refill Wellbutrin SR for 1 month. Orders and follow up as documented in patient record.   - buPROPion (WELLBUTRIN SR) 150 MG 12 hr tablet; Take 1 tablet (150 mg total) by mouth daily.  Dispense: 30 tablet; Refill: 0  3. Essential hypertension Tonya Nicholson is working on healthy weight loss, diet, and exercise to improve blood pressure control. We will watch for signs of hypotension as she continues her lifestyle modifications. We will refill metoprolol for 1 month, and will continue to monitor.  - metoprolol tartrate (LOPRESSOR) 25 MG tablet; Take 1 tablet (25 mg total) by mouth 2 (two) times daily.  Dispense: 60 tablet; Refill: 0  4. At risk for complication associated with hypotension Tonya Nicholson was given approximately 15 minutes of education and counseling today to help avoid hypotension. We discussed risks of hypotension with weight loss and signs of hypotension such as feeling lightheaded or unsteady.  Repetitive spaced learning was employed today to elicit superior memory formation and behavioral change.  5. Class 2 severe obesity with serious comorbidity and body mass index (BMI) of 38.0 to 38.9 in adult, unspecified obesity type (HCC) Tonya Nicholson is currently in the action stage of change. As such, her goal is to continue with weight loss efforts. She has agreed to the Category 2 Plan.   Exercise goals: Tonya Nicholson is to start some walking and try walking videos to start.  Behavioral modification strategies: increasing lean protein intake and better snacking choices.  Tonya Nicholson has agreed to follow-up with our clinic in 2 weeks. She was informed  of the importance of frequent follow-up visits to maximize her success with intensive lifestyle modifications for her multiple health conditions.   Objective:   Blood pressure 112/61, pulse (!) 56, temperature 98.1 F (36.7 C), temperature source Oral, height 5\' 7"  (1.702 m), weight 247 lb (112 kg), last menstrual  period 04/09/2016, SpO2 100 %. Body mass index is 38.69 kg/m.  General: Cooperative, alert, well developed, in no acute distress. HEENT: Conjunctivae and lids unremarkable. Cardiovascular: Regular rhythm.  Lungs: Normal work of breathing. Neurologic: No focal deficits.   Lab Results  Component Value Date   CREATININE 0.84 01/31/2019   BUN 11 01/31/2019   NA 140 01/31/2019   K 4.1 01/31/2019   CL 105 01/31/2019   CO2 22 01/31/2019   Lab Results  Component Value Date   ALT 23 01/31/2019   AST 19 01/31/2019   ALKPHOS 91 01/31/2019   BILITOT 0.5 01/31/2019   Lab Results  Component Value Date   HGBA1C 5.7 (H) 01/31/2019   HGBA1C 6.2 (H) 02/24/2018   HGBA1C 6.4 (H) 05/08/2016   Lab Results  Component Value Date   INSULIN 8.2 01/31/2019   INSULIN 10.7 02/24/2018   Lab Results  Component Value Date   TSH 1.410 02/24/2018   Lab Results  Component Value Date   CHOL 143 02/24/2018   HDL 52 02/24/2018   LDLCALC 71 02/24/2018   TRIG 100 02/24/2018   CHOLHDL 2.8 05/08/2016   Lab Results  Component Value Date   WBC 7.5 02/24/2018   HGB 13.9 03/21/2018   HCT 41.0 03/21/2018   MCV 90 02/24/2018   PLT 273 05/08/2016   No results found for: IRON, TIBC, FERRITIN  Attestation Statements:   Reviewed by clinician on day of visit: allergies, medications, problem list, medical history, surgical history, family history, social history, and previous encounter notes.   I, Trixie Dredge, am acting as transcriptionist for Dennard Nip, MD.  I have reviewed the above documentation for accuracy and completeness, and I agree with the above. -  Dennard Nip, MD

## 2019-03-20 MED FILL — VIT D2 1.25 MG (50,000 UNIT: 1.25 MG | 28 days supply | Qty: 4 | Fill #0

## 2019-03-20 MED FILL — ELIQUIS 5 MG TABLET: 5 | 90 days supply | Qty: 180 | Fill #0

## 2019-03-20 MED FILL — BUPROPION HCL SR 150 MG TAB: 150 | 30 days supply | Qty: 30 | Fill #0

## 2019-03-22 ENCOUNTER — Encounter (INDEPENDENT_AMBULATORY_CARE_PROVIDER_SITE_OTHER): Payer: Self-pay | Admitting: Physician Assistant

## 2019-03-22 ENCOUNTER — Other Ambulatory Visit: Payer: Self-pay

## 2019-03-22 ENCOUNTER — Ambulatory Visit (INDEPENDENT_AMBULATORY_CARE_PROVIDER_SITE_OTHER): Payer: 59 | Admitting: Physician Assistant

## 2019-03-22 VITALS — BP 121/69 | HR 56 | Temp 97.9°F | Ht 67.0 in | Wt 249.0 lb

## 2019-03-22 DIAGNOSIS — Z6839 Body mass index (BMI) 39.0-39.9, adult: Secondary | ICD-10-CM | POA: Diagnosis not present

## 2019-03-22 DIAGNOSIS — R7303 Prediabetes: Secondary | ICD-10-CM

## 2019-03-23 MED FILL — AMLODIPINE-BENAZEPRIL 5-40: 5-40 | 30 days supply | Qty: 30 | Fill #1

## 2019-03-23 NOTE — Progress Notes (Signed)
Chief Complaint:   OBESITY Tonya Nicholson is here to discuss her progress with her obesity treatment plan along with follow-up of her obesity related diagnoses. Tonya Nicholson is on the Category 2 Plan and states she is following her eating plan approximately 75% of the time. [name] states she is exercising 0 minutes 0 times per week.  Today's visit was #: 21 Starting weight: 263 lbs Starting date: 02/24/2018 Today's weight: 249 lbs Today's date: 03/22/2019 Total lbs lost to date: 14  Total lbs lost since last in-office visit: 0  Interim History: Baby reports that she has done a better job getting in more of the food on the plan the last 2 weeks. She states that she may be under eating her protein on the 3 days she works.  Subjective:   Prediabetes. Tonya Nicholson has a diagnosis of prediabetes based on her elevated HgA1c and was informed this puts her at greater risk of developing diabetes. She continues to work on diet and exercise to decrease her risk of diabetes. Tonya Nicholson is on no medications. No polyphagia.  Lab Results  Component Value Date   HGBA1C 5.7 (H) 01/31/2019   Lab Results  Component Value Date   INSULIN 8.2 01/31/2019   INSULIN 10.7 02/24/2018   Assessment/Plan:   Prediabetes. [name] will continue to work on weight loss, exercise, and decreasing simple carbohydrates to help decrease the risk of diabetes.   Class 2 severe obesity with serious comorbidity and body mass index (BMI) of 39.0 to 39.9 in adult, unspecified obesity type ([place]).  Tonya Nicholson is currently in the action stage of change. As such, her goal is to continue with weight loss efforts. She has agreed to the Category 2 Plan.   Exercise goals: For substantial health benefits, adults should do at least 150 minutes (2 hours and 30 minutes) a week of moderate-intensity, or 75 minutes (1 hour and 15 minutes) a week of vigorous-intensity aerobic physical activity, or an equivalent combination of moderate- and vigorous-intensity  aerobic activity. Aerobic activity should be performed in episodes of at least 10 minutes, and preferably, it should be spread throughout the week.  Behavioral modification strategies: meal planning and cooking strategies and keeping healthy foods in the home.  Tonya Nicholson has agreed to follow-up with our clinic in 3 weeks. She was informed of the importance of frequent follow-up visits to maximize her success with intensive lifestyle modifications for her multiple health conditions.   Objective:   Blood pressure 121/69, pulse (!) 56, temperature 97.9 F (36.6 C), temperature source Oral, height 5\' 7"  (1.702 m), weight 249 lb (112.9 kg), last menstrual period 04/09/2016, SpO2 98 %. Body mass index is 39 kg/m.  General: Cooperative, alert, well developed, in no acute distress. HEENT: Conjunctivae and lids unremarkable. Cardiovascular: Regular rhythm.  Lungs: Normal work of breathing. Neurologic: No focal deficits.   Lab Results  Component Value Date   CREATININE 0.84 01/31/2019   BUN 11 01/31/2019   NA 140 01/31/2019   K 4.1 01/31/2019   CL 105 01/31/2019   CO2 22 01/31/2019   Lab Results  Component Value Date   ALT 23 01/31/2019   AST 19 01/31/2019   ALKPHOS 91 01/31/2019   BILITOT 0.5 01/31/2019   Lab Results  Component Value Date   HGBA1C 5.7 (H) 01/31/2019   HGBA1C 6.2 (H) 02/24/2018   HGBA1C 6.4 (H) 05/08/2016   Lab Results  Component Value Date   INSULIN 8.2 01/31/2019   INSULIN 10.7 02/24/2018  Lab Results  Component Value Date   TSH 1.410 02/24/2018   Lab Results  Component Value Date   CHOL 143 02/24/2018   HDL 52 02/24/2018   LDLCALC 71 02/24/2018   TRIG 100 02/24/2018   CHOLHDL 2.8 05/08/2016   Lab Results  Component Value Date   WBC 7.5 02/24/2018   HGB 13.9 03/21/2018   HCT 41.0 03/21/2018   MCV 90 02/24/2018   PLT 273 05/08/2016   No results found for: IRON, TIBC, FERRITIN  Attestation Statements:   Reviewed by clinician on day of visit:  allergies, medications, problem list, medical history, surgical history, family history, social history, and previous encounter notes.  Time spent on visit including pre-visit chart review and post-visit care was 30 minutes.   IMichaelene Song, am acting as transcriptionist for Abby Potash, PA-C   I have reviewed the above documentation for accuracy and completeness, and I agree with the above. Abby Potash, PA-C

## 2019-03-31 MED FILL — BUPROPION HCL SR 150 MG TAB: 150 | 30 days supply | Qty: 30 | Fill #0

## 2019-03-31 MED FILL — AMLODIPINE-BENAZEPRIL 5-40: 5-40 | 30 days supply | Qty: 30 | Fill #1

## 2019-03-31 MED FILL — ELIQUIS 5 MG TABLET: 5 | 90 days supply | Qty: 180 | Fill #0

## 2019-03-31 MED FILL — VIT D2 1.25 MG (50,000 UNIT: 1.25 MG | 28 days supply | Qty: 4 | Fill #0

## 2019-04-13 ENCOUNTER — Encounter (INDEPENDENT_AMBULATORY_CARE_PROVIDER_SITE_OTHER): Payer: Self-pay | Admitting: Physician Assistant

## 2019-04-13 ENCOUNTER — Ambulatory Visit (INDEPENDENT_AMBULATORY_CARE_PROVIDER_SITE_OTHER): Payer: 59 | Admitting: Physician Assistant

## 2019-04-13 ENCOUNTER — Other Ambulatory Visit: Payer: Self-pay

## 2019-04-13 VITALS — BP 106/62 | HR 56 | Temp 98.1°F | Ht 67.0 in | Wt 248.0 lb

## 2019-04-13 DIAGNOSIS — E559 Vitamin D deficiency, unspecified: Secondary | ICD-10-CM

## 2019-04-13 DIAGNOSIS — F3289 Other specified depressive episodes: Secondary | ICD-10-CM

## 2019-04-13 DIAGNOSIS — Z9189 Other specified personal risk factors, not elsewhere classified: Secondary | ICD-10-CM | POA: Diagnosis not present

## 2019-04-13 DIAGNOSIS — Z6838 Body mass index (BMI) 38.0-38.9, adult: Secondary | ICD-10-CM

## 2019-04-13 MED ORDER — BUPROPION HCL ER (SR) 150 MG PO TB12: 150.0000 mg | ORAL_TABLET | Freq: Every day | ORAL | 0 refills | Status: DC

## 2019-04-13 MED ORDER — VITAMIN D (ERGOCALCIFEROL) 1.25 MG (50000 UNIT) PO CAPS: 50000.0000 [IU] | ORAL_CAPSULE | ORAL | 0 refills | Status: DC

## 2019-04-13 NOTE — Progress Notes (Signed)
Chief Complaint:   OBESITY Tonya Nicholson is here to discuss her progress with her obesity treatment plan along with follow-up of her obesity related diagnoses. Tonya Nicholson is on the Category 2 Plan and states she is following her eating plan approximately 50% of the time. Tonya Nicholson states she is exercising 0 minutes 0 times per week.  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 Usual choices are donuts, biscuits, and pizza. She is having trouble resisting these temptations.  Subjective:   Vitamin D deficiency. Last Vitamin D level 38.1 on 01/31/2019.Tonya Nicholson is on weekly prescription Vitamin D. No nausea, vomiting, or muscle weakness.  Other depression, with emotional eating. Tonya Nicholson is struggling with emotional eating and using food for comfort to the extent that it is negatively impacting her health. She has been working on behavior modification techniques to help reduce her emotional eating and has been somewhat successful. She shows no sign of suicidal or homicidal ideations. Tonya Nicholson is on bupropion. Blood pressure is normal. She continues to have some cravings.  At risk for osteoporosis. Tonya Nicholson is at higher risk of osteopenia and osteoporosis due to Vitamin D deficiency.   Assessment/Plan:   Vitamin D deficiency. Low Vitamin D level contributes to fatigue and are associated with obesity, breast, and colon cancer. She was given a refill on her Vitamin D, Ergocalciferol, (DRISDOL) 1.25 MG (50000 UNIT) CAPS capsule every week #4 with 0 refills and will follow-up for routine testing of Vitamin D, at least 2-3 times per year to avoid over-replacement.    Other depression, with emotional eating. Behavior modification techniques were discussed  today to help [name] deal with her emotional/non-hunger eating behaviors.  Orders and follow up as documented in patient record. [name] was given a refill on her buPROPion (WELLBUTRIN SR) 150 MG 12 hr tablet #30 with 0 refills.  At risk for osteoporosis. [name] was given approximately 15 minutes of osteoporosis prevention counseling today. Tonya Nicholson is at risk for osteopenia and osteoporosis due to her Vitamin D deficiency. She was encouraged to take her Vitamin D and follow her higher calcium diet and increase strengthening exercise to help strengthen her bones and decrease her risk of osteopenia and osteoporosis.  Repetitive spaced learning was employed today to elicit superior memory formation and behavioral change.  Class 2 severe obesity with serious comorbidity and body mass index (BMI) of 38.0 to 38.9 in adult, unspecified obesity type ([place]).  Tonya Nicholson is currently in the action stage of change. As such, her goal is to continue with weight loss efforts. She has agreed to the Category 2 Plan and will journal 400-500 calories + 35 grams of protein at supper.   Exercise goals: For substantial health benefits, adults should do at least 150 minutes (2 hours and 30 minutes) a week of moderate-intensity, or 75 minutes (1 hour and 15 minutes) a week of vigorous-intensity aerobic physical activity, or an equivalent combination of moderate- and vigorous-intensity aerobic activity. Aerobic activity should be performed in episodes of at least 10 minutes, and preferably, it should be spread throughout the week.  Behavioral modification strategies: meal planning and cooking strategies, avoiding temptations and planning for success.  Tonya Nicholson has agreed to follow-up with our clinic in 2 weeks. She was  informed of the importance of frequent follow-up visits to maximize her success with intensive lifestyle modifications for her multiple health conditions.   Objective:   Blood pressure 106/62, pulse (!) 56, temperature  98.1 F (36.7 C), temperature source Oral, height 5\' 7"  (1.702 m), weight 248 lb (112.5 kg), last menstrual period 04/09/2016, SpO2 97 %. Body mass index is 38.84 kg/m.  General: Cooperative, alert, well developed, in no acute distress. HEENT: Conjunctivae and lids unremarkable. Cardiovascular: Regular rhythm.  Lungs: Normal work of breathing. Neurologic: No focal deficits.   Lab Results  Component Value Date   CREATININE 0.84 01/31/2019   BUN 11 01/31/2019   NA 140 01/31/2019   K 4.1 01/31/2019   CL 105 01/31/2019   CO2 22 01/31/2019   Lab Results  Component Value Date   ALT 23 01/31/2019   AST 19 01/31/2019   ALKPHOS 91 01/31/2019   BILITOT 0.5 01/31/2019   Lab Results  Component Value Date   HGBA1C 5.7 (H) 01/31/2019   HGBA1C 6.2 (H) 02/24/2018   HGBA1C 6.4 (H) 05/08/2016   Lab Results  Component Value Date   INSULIN 8.2 01/31/2019   INSULIN 10.7 02/24/2018   Lab Results  Component Value Date   TSH 1.410 02/24/2018   Lab Results  Component Value Date   CHOL 143 02/24/2018   HDL 52 02/24/2018   LDLCALC 71 02/24/2018   TRIG 100 02/24/2018   CHOLHDL 2.8 05/08/2016   Lab Results  Component Value Date   WBC 7.5 02/24/2018   HGB 13.9 03/21/2018   HCT 41.0 03/21/2018   MCV 90 02/24/2018   PLT 273 05/08/2016   No results found for: IRON, TIBC, FERRITIN  Attestation Statements:   Reviewed by clinician on day of visit: allergies, medications, problem list, medical history, surgical history, family history, social history, and previous encounter notes.  IMichaelene Song, am acting as transcriptionist for Abby Potash, PA-C   I have reviewed the above documentation for accuracy and completeness, and I agree with the above. Abby Potash, PA-C

## 2019-04-26 MED FILL — BUPROPION HCL SR 150 MG TAB: 150 | 30 days supply | Qty: 30 | Fill #0

## 2019-04-26 MED FILL — AMLODIPINE-BENAZEPRIL 5-40: 5-40 | 30 days supply | Qty: 30 | Fill #0

## 2019-04-27 ENCOUNTER — Encounter (INDEPENDENT_AMBULATORY_CARE_PROVIDER_SITE_OTHER): Payer: Self-pay | Admitting: Physician Assistant

## 2019-04-27 ENCOUNTER — Other Ambulatory Visit: Payer: Self-pay

## 2019-04-27 ENCOUNTER — Ambulatory Visit (INDEPENDENT_AMBULATORY_CARE_PROVIDER_SITE_OTHER): Payer: 59 | Admitting: Physician Assistant

## 2019-04-27 VITALS — BP 103/67 | HR 56 | Temp 98.5°F | Ht 67.0 in | Wt 249.0 lb

## 2019-04-27 DIAGNOSIS — Z9189 Other specified personal risk factors, not elsewhere classified: Secondary | ICD-10-CM

## 2019-04-27 DIAGNOSIS — E559 Vitamin D deficiency, unspecified: Secondary | ICD-10-CM | POA: Diagnosis not present

## 2019-04-27 DIAGNOSIS — Z6839 Body mass index (BMI) 39.0-39.9, adult: Secondary | ICD-10-CM

## 2019-04-27 DIAGNOSIS — F3289 Other specified depressive episodes: Secondary | ICD-10-CM | POA: Diagnosis not present

## 2019-04-27 MED ORDER — BUPROPION HCL ER (SR) 150 MG PO TB12: 150.0000 mg | ORAL_TABLET | Freq: Every day | ORAL | 0 refills | Status: DC

## 2019-04-27 MED ORDER — VITAMIN D (ERGOCALCIFEROL) 1.25 MG (50000 UNIT) PO CAPS: 50000.0000 [IU] | ORAL_CAPSULE | ORAL | 0 refills | Status: DC

## 2019-04-27 MED FILL — VIT D2 1.25 MG (50,000 UNIT: 1.25 MG | 28 days supply | Qty: 4 | Fill #0

## 2019-05-01 NOTE — Progress Notes (Signed)
Chief Complaint:   OBESITY Tonya Nicholson is here to discuss her progress with her obesity treatment plan along with follow-up of her obesity related diagnoses. Tonya Nicholson is on the Category 2 Plan and keeping a food journal and adhering to recommended goals of 400-500 calories and 35 grams of protein at supper daily and states she is following her eating plan approximately 85% of the time. Tonya Nicholson states she is walking for 15-20 minutes 3 times per week.  Today's visit was #: 23 Starting weight: 263 lbs Starting date: 02/24/2018 Today's weight: 249 lbs Today's date: 04/27/2019 Total lbs lost to date: 14 Total lbs lost since last in-office visit: 0  Interim History: Tonya Nicholson states that she has done better with resisting temptations at work. She continues to skip dinner at time. She notes snoring at night, and denies headache or excessive daytime sleepiness. She has not being evaluated for sleep apnea.  Subjective:   1. Vitamin D deficiency Tonya Nicholson is due for labs. She is on Vit D weekly. Last Vit D level was 38.1.  2. Other depression, with emotional eating  Tonya Nicholson denies suicidal ideas or homicidal ideas. She denies cravings and her blood pressure is normal.  3. At risk for sleep apnea Tonya Nicholson is at increased risk for sleep apnea due to obesity.   Assessment/Plan:   1. Vitamin D deficiency Low Vitamin D level contributes to fatigue and are associated with obesity, breast, and colon cancer. We will refill prescription Vitamin D for 1 months. Rupa will follow-up for routine testing of Vitamin D, at least 2-3 times per year to avoid over-replacement.  - Vitamin D, Ergocalciferol, (DRISDOL) 1.25 MG (50000 UNIT) CAPS capsule; Take 1 capsule (50,000 Units total) by mouth every 7 (seven) days.  Dispense: 4 capsule; Refill: 0  2. Other depression, with emotional eating  Behavior modification techniques were discussed today to help Norma deal with her emotional/non-hunger eating behaviors. We will refill  bupropion SR for 1 month. Orders and follow up as documented in patient record.   - buPROPion (WELLBUTRIN SR) 150 MG 12 hr tablet; Take 1 tablet (150 mg total) by mouth daily.  Dispense: 30 tablet; Refill: 0  3. At risk for sleep apnea Tonya Nicholson was given approximately 15 minutes of coronary artery disease prevention counseling today. She is 59 y.o. female and has risk factors for obstructive sleep apnea including obesity. We discussed intensive lifestyle modifications today with an emphasis on specific weight loss instructions and strategies.  Repetitive spaced learning was employed today to elicit superior memory formation and behavioral change.  4. Class 2 severe obesity with serious comorbidity and body mass index (BMI) of 39.0 to 39.9 in adult, unspecified obesity type (HCC) Tonya Nicholson is currently in the action stage of change. As such, her goal is to continue with weight loss efforts. She has agreed to the Category 2 Plan.   Tonya Nicholson states she will consider a referral to Neurology for possible obstructive sleep apnea and let the provider know.  Exercise goals: As is.  Behavioral modification strategies: increasing lean protein intake and no skipping meals.  Tonya Nicholson has agreed to follow-up with our clinic in 3 weeks. She was informed of the importance of frequent follow-up visits to maximize her success with intensive lifestyle modifications for her multiple health conditions.   Objective:   Blood pressure 103/67, pulse (!) 56, temperature 98.5 F (36.9 C), temperature source Oral, height 5\' 7"  (1.702 m), weight 249 lb (112.9 kg), last menstrual period 04/09/2016, SpO2 96 %.  Body mass index is 39 kg/m.  General: Cooperative, alert, well developed, in no acute distress. HEENT: Conjunctivae and lids unremarkable. Cardiovascular: Regular rhythm.  Lungs: Normal work of breathing. Neurologic: No focal deficits.   Lab Results  Component Value Date   CREATININE 0.84 01/31/2019   BUN 11  01/31/2019   NA 140 01/31/2019   K 4.1 01/31/2019   CL 105 01/31/2019   CO2 22 01/31/2019   Lab Results  Component Value Date   ALT 23 01/31/2019   AST 19 01/31/2019   ALKPHOS 91 01/31/2019   BILITOT 0.5 01/31/2019   Lab Results  Component Value Date   HGBA1C 5.7 (H) 01/31/2019   HGBA1C 6.2 (H) 02/24/2018   HGBA1C 6.4 (H) 05/08/2016   Lab Results  Component Value Date   INSULIN 8.2 01/31/2019   INSULIN 10.7 02/24/2018   Lab Results  Component Value Date   TSH 1.410 02/24/2018   Lab Results  Component Value Date   CHOL 143 02/24/2018   HDL 52 02/24/2018   LDLCALC 71 02/24/2018   TRIG 100 02/24/2018   CHOLHDL 2.8 05/08/2016   Lab Results  Component Value Date   WBC 7.5 02/24/2018   HGB 13.9 03/21/2018   HCT 41.0 03/21/2018   MCV 90 02/24/2018   PLT 273 05/08/2016   No results found for: IRON, TIBC, FERRITIN  Attestation Statements:   Reviewed by clinician on day of visit: allergies, medications, problem list, medical history, surgical history, family history, social history, and previous encounter notes.   Wilhemena Durie, am acting as transcriptionist for [employer], PA-C.  I have reviewed the above documentation for accuracy and completeness, and I agree with the above. Abby Potash, PA-C

## 2019-05-18 ENCOUNTER — Ambulatory Visit (INDEPENDENT_AMBULATORY_CARE_PROVIDER_SITE_OTHER): Payer: 59 | Admitting: Family Medicine

## 2019-05-18 DIAGNOSIS — Z6841 Body Mass Index (BMI) 40.0 and over, adult: Secondary | ICD-10-CM | POA: Diagnosis not present

## 2019-05-18 DIAGNOSIS — Z1231 Encounter for screening mammogram for malignant neoplasm of breast: Secondary | ICD-10-CM | POA: Diagnosis not present

## 2019-05-18 DIAGNOSIS — Z01419 Encounter for gynecological examination (general) (routine) without abnormal findings: Secondary | ICD-10-CM | POA: Diagnosis not present

## 2019-05-25 ENCOUNTER — Ambulatory Visit (INDEPENDENT_AMBULATORY_CARE_PROVIDER_SITE_OTHER): Payer: 59 | Admitting: Family Medicine

## 2019-05-25 MED FILL — AMLODIPINE-BENAZEPRIL 5-40: 5-40 | 30 days supply | Qty: 30 | Fill #0

## 2019-05-25 MED FILL — BUPROPION HCL SR 150 MG TAB: 150 | 30 days supply | Qty: 30 | Fill #0

## 2019-05-25 MED FILL — VIT D2 1.25 MG (50,000 UNIT: 1.25 MG | 28 days supply | Qty: 4 | Fill #0

## 2019-05-25 MED FILL — METOPROLOL TARTRATE 25 MG T: 25 | 90 days supply | Qty: 180 | Fill #0

## 2019-06-28 MED FILL — AMLODIPINE-BENAZEPRIL 5-40: 5-40 | 30 days supply | Qty: 30 | Fill #1

## 2019-06-28 MED FILL — ELIQUIS 5 MG TABLET: 5 | 90 days supply | Qty: 180 | Fill #1

## 2019-06-29 ENCOUNTER — Encounter (INDEPENDENT_AMBULATORY_CARE_PROVIDER_SITE_OTHER): Payer: Self-pay | Admitting: Physician Assistant

## 2019-06-29 ENCOUNTER — Other Ambulatory Visit: Payer: Self-pay

## 2019-06-29 ENCOUNTER — Ambulatory Visit (INDEPENDENT_AMBULATORY_CARE_PROVIDER_SITE_OTHER): Payer: 59 | Admitting: Physician Assistant

## 2019-06-29 ENCOUNTER — Other Ambulatory Visit (INDEPENDENT_AMBULATORY_CARE_PROVIDER_SITE_OTHER): Payer: Self-pay | Admitting: Physician Assistant

## 2019-06-29 VITALS — BP 100/66 | HR 51 | Temp 97.9°F | Ht 67.0 in | Wt 251.0 lb

## 2019-06-29 DIAGNOSIS — R7303 Prediabetes: Secondary | ICD-10-CM | POA: Diagnosis not present

## 2019-06-29 DIAGNOSIS — Z6839 Body mass index (BMI) 39.0-39.9, adult: Secondary | ICD-10-CM

## 2019-06-29 DIAGNOSIS — I10 Essential (primary) hypertension: Secondary | ICD-10-CM

## 2019-06-29 DIAGNOSIS — Z9189 Other specified personal risk factors, not elsewhere classified: Secondary | ICD-10-CM

## 2019-06-29 DIAGNOSIS — F3289 Other specified depressive episodes: Secondary | ICD-10-CM

## 2019-06-29 DIAGNOSIS — E559 Vitamin D deficiency, unspecified: Secondary | ICD-10-CM

## 2019-06-29 MED ORDER — VITAMIN D (ERGOCALCIFEROL) 1.25 MG (50000 UNIT) PO CAPS: 50000.0000 [IU] | ORAL_CAPSULE | ORAL | 0 refills | Status: DC

## 2019-06-29 MED ORDER — METOPROLOL TARTRATE 25 MG PO TABS: 25.0000 mg | ORAL_TABLET | Freq: Two times a day (BID) | ORAL | 0 refills | Status: DC

## 2019-06-29 MED ORDER — BUPROPION HCL ER (SR) 150 MG PO TB12: 150.0000 mg | ORAL_TABLET | Freq: Every day | ORAL | 0 refills | Status: DC

## 2019-06-29 MED FILL — VIT D2 1.25 MG (50,000 UNIT: 1.25 MG | 28 days supply | Qty: 4 | Fill #0

## 2019-06-29 MED FILL — BUPROPION HCL SR 150 MG TAB: 150 | 30 days supply | Qty: 30 | Fill #0

## 2019-06-29 NOTE — Progress Notes (Signed)
Chief Complaint:   OBESITY Tonya Nicholson is here to discuss her progress with her obesity treatment plan along with follow-up of her obesity related diagnoses. Tonya Nicholson is on the Category 2 Plan and states she is following her eating plan approximately 0% of the time. Tonya Nicholson states she has increased her activity.  Today's visit was #: 24 Starting weight: 263 lbs Starting date: 02/24/2018 Today's weight: 251 lbs Today's date: 06/29/2019 Total lbs lost to date: 12 lbs Total lbs lost since last in-office visit: 0  Interim History: Tonya Nicholson states that she was in [place] for a week and at [employer] for a week and did not eat on plan.  She is frustrated with herself and her inability to follow the plan. She has not been meal planning but states that she will start planning and prepping on Sundays for the week.   Subjective:   1. Vitamin D deficiency Tonya Nicholson's Vitamin D level was 38.0 on 01/31/2019. She is currently taking prescription vitamin D 50,000 IU each week. She denies nausea, vomiting or muscle weakness.  2. Essential hypertension Review: taking medications as instructed, no medication side effects noted, no chest pain on exertion, no dyspnea on exertion, no swelling of ankles.  Blood pressure is normal  She is taking metoprolol.  BP Readings from Last 3 Encounters:  06/29/19 100/66  04/27/19 103/67  04/13/19 106/62   3. Prediabetes Tonya Nicholson has a diagnosis of prediabetes based on her elevated HgA1c and was informed this puts her at greater risk of developing diabetes. She continues to work on diet and exercise to decrease her risk of diabetes. She denies nausea or hypoglycemia.  She is not on any medication.  Denies polyphagia.  She is due for labs.  Lab Results  Component Value Date   HGBA1C 5.7 (H) 01/31/2019   Lab Results  Component Value Date   INSULIN 8.2 01/31/2019   INSULIN 10.7 02/24/2018   4. Other depression, with emotional eating  Tonya Nicholson is struggling with emotional eating  and using food for comfort to the extent that it is negatively impacting her health. She has been working on behavior modification techniques to help reduce her emotional eating and has been unsuccessful. She shows no sign of suicidal or homicidal ideations.  She is taking bupropion.  Blood pressure is normal.  5. At risk for heart disease Tonya Nicholson is at a higher than average risk for cardiovascular disease due to obesity.   Assessment/Plan:   1. Vitamin D deficiency Low Vitamin D level contributes to fatigue and are associated with obesity, breast, and colon cancer. She agrees to continue to take prescription Vitamin D @50 ,000 IU every week and will follow-up for routine testing of Vitamin D, at least 2-3 times per year to avoid over-replacement. - Lipid Panel With LDL/HDL Ratio - VITAMIN D 25 Hydroxy (Vit-D Deficiency, Fractures) - Vitamin D, Ergocalciferol, (DRISDOL) 1.25 MG (50000 UNIT) CAPS capsule; Take 1 capsule (50,000 Units total) by mouth every 7 (seven) days.  Dispense: 4 capsule; Refill: 0  2. Essential hypertension Tonya Nicholson is working on healthy weight loss and exercise to improve blood pressure control. We will watch for signs of hypotension as she continues her lifestyle modifications. - Comprehensive metabolic panel - Lipid Panel With LDL/HDL Ratio - metoprolol tartrate (LOPRESSOR) 25 MG tablet; Take 1 tablet (25 mg total) by mouth 2 (two) times daily.  Dispense: 90 tablet; Refill: 0  3. Prediabetes Tonya Nicholson will continue to work on weight loss, exercise, and decreasing simple  carbohydrates to help decrease the risk of diabetes.  - Hemoglobin A1c - Insulin, random - Lipid Panel With LDL/HDL Ratio  4. Other depression, with emotional eating  Behavior modification techniques were discussed today to help Tonya Nicholson deal with her emotional/non-hunger eating behaviors.  Orders and follow up as documented in patient record.  - buPROPion (WELLBUTRIN SR) 150 MG 12 hr tablet; Take 1 tablet (150  mg total) by mouth daily.  Dispense: 30 tablet; Refill: 0  5. At risk for heart disease Tonya Nicholson was given approximately 15 minutes of coronary artery disease prevention counseling today. She is 59 y.o. female and has risk factors for heart disease including obesity. We discussed intensive lifestyle modifications today with an emphasis on specific weight loss instructions and strategies.   Repetitive spaced learning was employed today to elicit superior memory formation and behavioral change.  6. Class 2 severe obesity with serious comorbidity and body mass index (BMI) of 39.0 to 39.9 in adult, unspecified obesity type (HCC) Tonya Nicholson is currently in the action stage of change. As such, her goal is to continue with weight loss efforts. She has agreed to the Category 2 Plan.   Exercise goals: For substantial health benefits, adults should do at least 150 minutes (2 hours and 30 minutes) a week of moderate-intensity, or 75 minutes (1 hour and 15 minutes) a week of vigorous-intensity aerobic physical activity, or an equivalent combination of moderate- and vigorous-intensity aerobic activity. Aerobic activity should be performed in episodes of at least 10 minutes, and preferably, it should be spread throughout the week.  Behavioral modification strategies: no skipping meals and meal planning and cooking strategies.  Tonya Nicholson has agreed to follow-up with our clinic in 2 weeks. She was informed of the importance of frequent follow-up visits to maximize her success with intensive lifestyle modifications for her multiple health conditions.   Tonya Nicholson was informed we would discuss her lab results at her next visit unless there is a critical issue that needs to be addressed sooner. Tonya Nicholson agreed to keep her next visit at the agreed upon time to discuss these results.  Objective:   Blood pressure 100/66, pulse (!) 51, temperature 97.9 F (36.6 C), temperature source Oral, height 5\' 7"  (1.702 m), weight 251 lb (113.9  kg), last menstrual period 04/09/2016, SpO2 97 %. Body mass index is 39.31 kg/m.  General: Cooperative, alert, well developed, in no acute distress. HEENT: Conjunctivae and lids unremarkable. Cardiovascular: Regular rhythm.  Lungs: Normal work of breathing. Neurologic: No focal deficits.   Lab Results  Component Value Date   CREATININE 0.84 01/31/2019   BUN 11 01/31/2019   NA 140 01/31/2019   K 4.1 01/31/2019   CL 105 01/31/2019   CO2 22 01/31/2019   Lab Results  Component Value Date   ALT 23 01/31/2019   AST 19 01/31/2019   ALKPHOS 91 01/31/2019   BILITOT 0.5 01/31/2019   Lab Results  Component Value Date   HGBA1C 5.7 (H) 01/31/2019   HGBA1C 6.2 (H) 02/24/2018   HGBA1C 6.4 (H) 05/08/2016   Lab Results  Component Value Date   INSULIN 8.2 01/31/2019   INSULIN 10.7 02/24/2018   Lab Results  Component Value Date   TSH 1.410 02/24/2018   Lab Results  Component Value Date   CHOL 143 02/24/2018   HDL 52 02/24/2018   LDLCALC 71 02/24/2018   TRIG 100 02/24/2018   CHOLHDL 2.8 05/08/2016   Lab Results  Component Value Date   WBC 7.5 02/24/2018  HGB 13.9 03/21/2018   HCT 41.0 03/21/2018   MCV 90 02/24/2018   PLT 273 05/08/2016   Attestation Statements:   Reviewed by clinician on day of visit: allergies, medications, problem list, medical history, surgical history, family history, social history, and previous encounter notes.  I, Water quality scientist, CMA, am acting as transcriptionist for Abby Potash, PA-C  I have reviewed the above documentation for accuracy and completeness, and I agree with the above. Abby Potash, PA-C

## 2019-06-30 LAB — COMPREHENSIVE METABOLIC PANEL
ALT: 17 IU/L (ref 0–32)
AST: 14 IU/L (ref 0–40)
Albumin/Globulin Ratio: 1.8 (ref 1.2–2.2)
Albumin: 4.2 g/dL (ref 3.8–4.9)
Alkaline Phosphatase: 75 IU/L (ref 48–121)
BUN/Creatinine Ratio: 18 (ref 9–23)
BUN: 17 mg/dL (ref 6–24)
Bilirubin Total: 0.4 mg/dL (ref 0.0–1.2)
CO2: 23 mmol/L (ref 20–29)
Calcium: 9.3 mg/dL (ref 8.7–10.2)
Chloride: 104 mmol/L (ref 96–106)
Creatinine, Ser: 0.96 mg/dL (ref 0.57–1.00)
GFR calc Af Amer: 75 mL/min/{1.73_m2} (ref >59)
GFR calc non Af Amer: 65 mL/min/{1.73_m2} (ref >59)
Globulin, Total: 2.4 g/dL (ref 1.5–4.5)
Glucose: 126 mg/dL — ABNORMAL HIGH (ref 65–99)
Potassium: 4.5 mmol/L (ref 3.5–5.2)
Sodium: 139 mmol/L (ref 134–144)
Total Protein: 6.6 g/dL (ref 6.0–8.5)

## 2019-06-30 LAB — LIPID PANEL WITH LDL/HDL RATIO
Cholesterol, Total: 154 mg/dL (ref 100–199)
HDL: 59 mg/dL (ref >39)
LDL Chol Calc (NIH): 83 mg/dL (ref 0–99)
LDL/HDL Ratio: 1.4 ratio (ref 0.0–3.2)
Triglycerides: 57 mg/dL (ref 0–149)
VLDL Cholesterol Cal: 12 mg/dL (ref 5–40)

## 2019-06-30 LAB — VITAMIN D 25 HYDROXY (VIT D DEFICIENCY, FRACTURES): Vit D, 25-Hydroxy: 48.7 ng/mL (ref 30.0–100.0)

## 2019-06-30 LAB — HEMOGLOBIN A1C
Est. average glucose Bld gHb Est-mCnc: 117 mg/dL
Hgb A1c MFr Bld: 5.7 % — ABNORMAL HIGH (ref 4.8–5.6)

## 2019-06-30 LAB — INSULIN, RANDOM: INSULIN: 14.5 u[IU]/mL (ref 2.6–24.9)

## 2019-07-12 ENCOUNTER — Ambulatory Visit (INDEPENDENT_AMBULATORY_CARE_PROVIDER_SITE_OTHER): Payer: 59 | Admitting: Adult Health

## 2019-07-12 ENCOUNTER — Other Ambulatory Visit: Payer: Self-pay

## 2019-07-12 ENCOUNTER — Encounter (INDEPENDENT_AMBULATORY_CARE_PROVIDER_SITE_OTHER): Payer: Self-pay | Admitting: Adult Health

## 2019-07-12 VITALS — BP 108/70 | HR 61 | Temp 97.9°F | Ht 67.0 in | Wt 248.0 lb

## 2019-07-12 DIAGNOSIS — E559 Vitamin D deficiency, unspecified: Secondary | ICD-10-CM

## 2019-07-12 DIAGNOSIS — I10 Essential (primary) hypertension: Secondary | ICD-10-CM

## 2019-07-12 DIAGNOSIS — Z6838 Body mass index (BMI) 38.0-38.9, adult: Secondary | ICD-10-CM

## 2019-07-12 DIAGNOSIS — R7303 Prediabetes: Secondary | ICD-10-CM | POA: Diagnosis not present

## 2019-07-13 DIAGNOSIS — R7303 Prediabetes: Secondary | ICD-10-CM | POA: Insufficient documentation

## 2019-07-13 DIAGNOSIS — E559 Vitamin D deficiency, unspecified: Secondary | ICD-10-CM | POA: Insufficient documentation

## 2019-07-13 NOTE — Progress Notes (Signed)
Chief Complaint:   OBESITY Tonya Nicholson is here to discuss her progress with her obesity treatment plan along with follow-up of her obesity related diagnoses. Tonya Nicholson is on the Category 2 Plan and states she is following her eating plan approximately 75% of the time. Tonya Nicholson states she is walking for 30+ minutes 2 times per week.  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 She denies polyphagia and will find it challenging to consume all of her dinner protein after long shifts at work.  Subjective:   1. Vitamin D deficiency Tonya Nicholson's Vitamin D level was 48.7 on 06/29/2019. She is currently taking prescription vitamin D 50,000 IU each week. She denies nausea, vomiting or muscle weakness. I discussed labs with the patient today.  2. Essential hypertension Tonya Nicholson's blood pressure is well controlled at her office visit today. She is on metoprolol 25 mg BID. She denies cardiac symptoms. I discussed labs with the patient today.  BP Readings from Last 3 Encounters:  07/12/19 108/70  06/29/19 100/66  04/27/19 103/67   3. Pre-diabetes Tonya Nicholson's last A1c was 5.7 and insulin level 14.5 on 06/29/2019. She denies polyphagia. We discussed risks and benefits of starting metformin, and she would like to defer at this time. She has prevalent family history of type 2 diabetes mellitus. I discussed labs with the patient today.  Assessment/Plan:   1. Vitamin D deficiency Low Vitamin D level contributes to fatigue and are associated with obesity, breast, and colon cancer. Tonya Nicholson agreed to continue taking prescription Vitamin D 50,000 IU every week, no refill needed today. She will follow-up for routine testing of Vitamin D, at least  2-3 times per year to avoid over-replacement. We will recheck labs in 3 months.  2. Essential hypertension Tonya Nicholson will continue her beta blocker, and will continue her Category 2 meal plan. She will continue working on healthy weight loss and exercise to improve blood pressure control. We will watch for signs of hypotension as she continues her lifestyle modifications. We will recheck labs in 3 months.  3. Pre-diabetes Tonya Nicholson will continue her Category 2 meal plan, and will continue to work on weight loss, exercise, and decreasing simple carbohydrates to help decrease the risk of diabetes. If Tonya Nicholson's weight loss stalls or cravings increase, then we will again discuss initiation of metformin. We will recheck labs in 3 months.  4. Class 2 severe obesity with serious comorbidity and body mass index (BMI) of 38.0 to 38.9 in adult, unspecified obesity type (HCC) Tonya Nicholson is currently in the action stage of change. As such, her goal is to continue with weight loss efforts. She has agreed to the Category 2 Plan.   Handouts provided today: Protein Equivalents, and High Protein/Low Calorie Food list.  Exercise goals: As is.  Behavioral modification strategies: increasing lean protein intake, decreasing simple carbohydrates, no skipping meals and meal planning and cooking strategies.  Tonya Nicholson has agreed to follow-up with our clinic in 2 weeks. She was informed of the importance of frequent follow-up visits to maximize her success with intensive lifestyle modifications for her multiple health conditions.   Objective:   Blood pressure 108/70, pulse 61, temperature 97.9 F (36.6 C), temperature source Oral, height 5\' 7"  (1.702 m), weight  248 lb (112.5 kg), last menstrual period 04/09/2016, SpO2 98 %. Body mass index is 38.84 kg/m.  General: Cooperative, alert, well developed, in no acute distress. HEENT: Conjunctivae and lids unremarkable. Cardiovascular: Regular rhythm.  Lungs: Normal work of  breathing. Neurologic: No focal deficits.   Lab Results  Component Value Date   CREATININE 0.96 06/29/2019   BUN 17 06/29/2019   NA 139 06/29/2019   K 4.5 06/29/2019   CL 104 06/29/2019   CO2 23 06/29/2019   Lab Results  Component Value Date   ALT 17 06/29/2019   AST 14 06/29/2019   ALKPHOS 75 06/29/2019   BILITOT 0.4 06/29/2019   Lab Results  Component Value Date   HGBA1C 5.7 (H) 06/29/2019   HGBA1C 5.7 (H) 01/31/2019   HGBA1C 6.2 (H) 02/24/2018   HGBA1C 6.4 (H) 05/08/2016   Lab Results  Component Value Date   INSULIN 14.5 06/29/2019   INSULIN 8.2 01/31/2019   INSULIN 10.7 02/24/2018   Lab Results  Component Value Date   TSH 1.410 02/24/2018   Lab Results  Component Value Date   CHOL 154 06/29/2019   HDL 59 06/29/2019   LDLCALC 83 06/29/2019   TRIG 57 06/29/2019   CHOLHDL 2.8 05/08/2016   Lab Results  Component Value Date   WBC 7.5 02/24/2018   HGB 13.9 03/21/2018   HCT 41.0 03/21/2018   MCV 90 02/24/2018   PLT 273 05/08/2016   No results found for: IRON, TIBC, FERRITIN  Attestation Statements:   Reviewed by clinician on day of visit: allergies, medications, problem list, medical history, surgical history, family history, social history, and previous encounter notes.  Time spent on visit including pre-visit chart review and post-visit care and charting was 29 minutes.    Wilhemena Durie, am acting as Location manager for [employer], NP-C.  I have reviewed the above documentation for accuracy and completeness, and I agree with the above. -  Esaw Grandchild, NP

## 2019-07-24 MED FILL — AMLODIPINE-BENAZEPRIL 5-40: 5-40 | 30 days supply | Qty: 30 | Fill #2

## 2019-07-27 ENCOUNTER — Ambulatory Visit (INDEPENDENT_AMBULATORY_CARE_PROVIDER_SITE_OTHER): Payer: 59 | Admitting: Adult Health

## 2019-08-03 ENCOUNTER — Ambulatory Visit (INDEPENDENT_AMBULATORY_CARE_PROVIDER_SITE_OTHER): Payer: 59 | Admitting: Adult Health

## 2019-08-22 ENCOUNTER — Other Ambulatory Visit (INDEPENDENT_AMBULATORY_CARE_PROVIDER_SITE_OTHER): Payer: Self-pay | Admitting: Physician Assistant

## 2019-08-22 DIAGNOSIS — F3289 Other specified depressive episodes: Secondary | ICD-10-CM

## 2019-08-23 MED FILL — AMLODIPINE-BENAZEPRIL 5-40: 5-40 | 30 days supply | Qty: 30 | Fill #0

## 2019-08-24 ENCOUNTER — Encounter (INDEPENDENT_AMBULATORY_CARE_PROVIDER_SITE_OTHER): Payer: Self-pay

## 2019-08-24 ENCOUNTER — Ambulatory Visit (INDEPENDENT_AMBULATORY_CARE_PROVIDER_SITE_OTHER): Payer: 59 | Admitting: Family Medicine

## 2019-08-24 ENCOUNTER — Other Ambulatory Visit (INDEPENDENT_AMBULATORY_CARE_PROVIDER_SITE_OTHER): Payer: Self-pay | Admitting: Physician Assistant

## 2019-08-24 DIAGNOSIS — F3289 Other specified depressive episodes: Secondary | ICD-10-CM

## 2019-09-14 ENCOUNTER — Ambulatory Visit (INDEPENDENT_AMBULATORY_CARE_PROVIDER_SITE_OTHER): Payer: 59 | Admitting: Adult Health

## 2019-09-25 MED FILL — METOPROLOL TARTRATE 25 MG T: 25 | 90 days supply | Qty: 180 | Fill #1

## 2019-09-25 MED FILL — ELIQUIS 5 MG TABLET: 5 | 90 days supply | Qty: 180 | Fill #2

## 2019-09-25 MED FILL — AMLODIPINE-BENAZEPRIL 5-40: 5-40 | 30 days supply | Qty: 30 | Fill #1

## 2019-10-03 ENCOUNTER — Encounter: Payer: Self-pay | Admitting: Cardiology

## 2019-10-03 ENCOUNTER — Ambulatory Visit (INDEPENDENT_AMBULATORY_CARE_PROVIDER_SITE_OTHER): Payer: 59 | Admitting: Cardiology

## 2019-10-03 ENCOUNTER — Other Ambulatory Visit: Payer: Self-pay | Admitting: Cardiology

## 2019-10-03 ENCOUNTER — Other Ambulatory Visit: Payer: Self-pay

## 2019-10-03 VITALS — BP 110/64 | HR 78 | Ht 67.0 in | Wt 259.0 lb

## 2019-10-03 DIAGNOSIS — I480 Paroxysmal atrial fibrillation: Secondary | ICD-10-CM | POA: Diagnosis not present

## 2019-10-03 DIAGNOSIS — Z01812 Encounter for preprocedural laboratory examination: Secondary | ICD-10-CM | POA: Diagnosis not present

## 2019-10-03 DIAGNOSIS — I4819 Other persistent atrial fibrillation: Secondary | ICD-10-CM

## 2019-10-03 LAB — CBC
Hematocrit: 43.3 % (ref 34.0–46.6)
Hemoglobin: 14.5 g/dL (ref 11.1–15.9)
MCH: 29.7 pg (ref 26.6–33.0)
MCHC: 33.5 g/dL (ref 31.5–35.7)
MCV: 89 fL (ref 79–97)
Platelets: 288 10*3/uL (ref 150–450)
RBC: 4.89 x10E6/uL (ref 3.77–5.28)
RDW: 11.8 % (ref 11.7–15.4)
WBC: 8.5 10*3/uL (ref 3.4–10.8)

## 2019-10-03 LAB — BASIC METABOLIC PANEL
BUN/Creatinine Ratio: 20 (ref 9–23)
BUN: 16 mg/dL (ref 6–24)
CO2: 21 mmol/L (ref 20–29)
Calcium: 9.6 mg/dL (ref 8.7–10.2)
Chloride: 103 mmol/L (ref 96–106)
Creatinine, Ser: 0.8 mg/dL (ref 0.57–1.00)
GFR calc Af Amer: 93 mL/min/{1.73_m2} (ref >59)
GFR calc non Af Amer: 81 mL/min/{1.73_m2} (ref >59)
Glucose: 122 mg/dL — ABNORMAL HIGH (ref 65–99)
Potassium: 4.5 mmol/L (ref 3.5–5.2)
Sodium: 138 mmol/L (ref 134–144)

## 2019-10-03 MED ORDER — FLECAINIDE ACETATE 50 MG PO TABS
50.0000 mg | ORAL_TABLET | Freq: Two times a day (BID) | ORAL | 3 refills | Status: DC
Start: 2019-10-03 — End: 2020-01-23

## 2019-10-03 MED FILL — FLECAINIDE ACETATE 50 MG TA: 50 | 30 days supply | Qty: 60 | Fill #0

## 2019-10-03 NOTE — H&P (View-Only) (Signed)
Electrophysiology Office Note   Date:  10/03/2019   ID:  Tonya Nicholson, DOB Jun 01, 1960, MRN [id number]  PCP:  Tonya Nicholson, Tonya Nicholson  Cardiologist:   Primary Electrophysiologist:  Tonya Nicholson, Tonya Nicholson    No chief complaint on file.    History of Present Illness: Tonya Nicholson is a 59 y.o. female who is being seen today for the evaluation of atrial fibrillation at the request of Tonya Nicholson, Tonya Nicholson, Tonya Nicholson. Presenting today for electrophysiology evaluation.  She has a history of morbid obesity and hypertension.  She initially presented to her primary physician's office with fatigue.  She was noted to be in atrial fibrillation.  She was put on Eliquis and metoprolol and had a cardioversion.  She is also enrolled in the health and wellness clinic at [facility].    Today, denies symptoms of palpitations, chest pain, shortness of breath, orthopnea, PND, lower extremity edema, claudication, dizziness, presyncope, syncope, bleeding, or neurologic sequela. The patient is tolerating medications without difficulties.  Few weeks ago, she started to be more fatigued and short of breath.  She was out shopping with her daughter.  Her daughter felt her pulse and found that she was irregular.  She went home and checked on her cardiac monitor and was found to be in atrial fibrillation.  This is the first time she has been in atrial fibrillation since her cardioversion.   Past Medical History:  Diagnosis Date  . Dyspnea   . GERD (gastroesophageal reflux disease)   . Hypertension   . Joint pain   . Knee pain   . Lower extremity edema    Past Surgical History:  Procedure Laterality Date  . CARDIOVERSION N/A 03/21/2018   Procedure: CARDIOVERSION;  Surgeon: Sueanne Margarita, Tonya Nicholson;  Location: [facility] ENDOSCOPY;  Service: Cardiovascular;  Laterality: N/A;  . CARDIOVERSION N/A 03/31/2018   Procedure: CARDIOVERSION;  Surgeon: Constance Haw, Tonya Nicholson;  Location: [facility] ENDOSCOPY;  Service: Cardiovascular;  Laterality: N/A;      Current Outpatient Medications  Medication Sig Dispense Refill  . amLODipine-benazepril (LOTREL) 5-40 MG per capsule TAKE 1 CAPSULE BY MOUTH DAILY. 90 capsule 0  . apixaban (ELIQUIS) 5 MG TABS tablet TAKE 1 TABLET (5 MG TOTAL) BY MOUTH 2 (TWO) TIMES DAILY. 180 tablet 2  . buPROPion (WELLBUTRIN SR) 150 MG 12 hr tablet Take 1 tablet (150 mg total) by mouth daily. 30 tablet 0  . metoprolol tartrate (LOPRESSOR) 25 MG tablet Take 1 tablet (25 mg total) by mouth 2 (two) times daily. 90 tablet 0  . Vitamin D, Ergocalciferol, (DRISDOL) 1.25 MG (50000 UNIT) CAPS capsule Take 1 capsule (50,000 Units total) by mouth every 7 (seven) days. 4 capsule 0   No current facility-administered medications for this visit.    Allergies:   Patient has no known allergies.   Social History:  The patient  reports that she has never smoked. She has never used smokeless tobacco. She reports current alcohol use of about 9.0 standard drinks of alcohol per week. She reports that she does not use drugs.   Family History:  The patient's family history includes Atrial fibrillation in her mother; Bladder Cancer in her father; Diabetes in her brother, father, and mother; Healthy in her sister; Heart attack in her brother; Heart disease in her father; Hypertension in her father and mother.    ROS:  Please see the history of present illness.   Otherwise, review of systems is positive for none.   All other systems are reviewed  and negative.   PHYSICAL EXAM: VS:  BP 110/64   Pulse 78   Ht 5\' 7"  (1.702 m)   Wt 259 lb (117.5 kg)   LMP 04/09/2016 (Approximate)   SpO2 96%   BMI 40.57 kg/m  , BMI Body mass index is 40.57 kg/m. GEN: Well nourished, well developed, in no acute distress  HEENT: normal  Neck: no JVD, carotid bruits, or masses Cardiac: irregular; no murmurs, rubs, or gallops,no edema  Respiratory:  clear to auscultation bilaterally, normal work of breathing GI: soft, nontender, nondistended, + BS MS: no  deformity or atrophy  Skin: warm and dry Neuro:  Strength and sensation are intact Psych: euthymic mood, full affect  EKG:  EKG is ordered today. Personal review of the ekg ordered shows atrial fibrillation, rate 78  Recent Labs: 06/29/2019: ALT 17; BUN 17; Creatinine, Ser 0.96; Potassium 4.5; Sodium 139    Lipid Panel     Component Value Date/Time   CHOL 154 06/29/2019 1438   TRIG 57 06/29/2019 1438   HDL 59 06/29/2019 1438   CHOLHDL 2.8 05/08/2016 0829   LDLCALC 83 06/29/2019 1438     Wt Readings from Last 3 Encounters:  10/03/19 259 lb (117.5 kg)  07/12/19 248 lb (112.5 kg)  06/29/19 251 lb (113.9 kg)      Other studies Reviewed: Additional studies/ records that were reviewed today include: epic notes  ASSESSMENT AND PLAN:  1.  Persistent atrial fibrillation: Currently on metoprolol and Eliquis with a CHA2DS2-VASc of 2.  She is status post cardioversion and has previously remained in normal rhythm.  Unfortunately she has gone back into atrial fibrillation.  She has symptoms of weakness and fatigue.  She has been compliant with her anticoagulation.  We Monet North plan for cardioversion.  We Lizette Pazos also start 50 mg of flecainide.  2. Hypertension: Currently well controlled  3.  Snoring and daytime somnolence: We Jarian Longoria discuss at next appointment.  Current medicines are reviewed at length with the patient today.   The patient does not have concerns regarding her medicines.  The following changes were made today: Start flecainide  Labs/ tests ordered today include:  Orders Placed This Encounter  Procedures  . Basic metabolic panel  . CBC  . EKG 12-Lead    Disposition:   FU with Logyn Dedominicis 3 months  Signed, Margrette Wynia Meredith Nicholson, Tonya Nicholson  10/03/2019 11:51 AM     [facility] [address] [place] [address] [phone] (office) [phone] (fax)

## 2019-10-03 NOTE — Progress Notes (Signed)
Electrophysiology Office Note   Date:  10/03/2019   ID:  [name], DOB [birth year], MRN [id number]  PCP:  Linda Hedges, DO  Cardiologist:   Primary Electrophysiologist:  Tezra Mahr Meredith Leeds, MD    No chief complaint on file.    History of Present Illness: Tonya Nicholson is a 59 y.o. female who is being seen today for the evaluation of atrial fibrillation at the request of Danford, Berna Spare, NP. Presenting today for electrophysiology evaluation.  She has a history of morbid obesity and hypertension.  She initially presented to her primary physician's office with fatigue.  She was noted to be in atrial fibrillation.  She was put on Eliquis and metoprolol and had a cardioversion.  She is also enrolled in the health and wellness clinic at [facility].    Today, denies symptoms of palpitations, chest pain, shortness of breath, orthopnea, PND, lower extremity edema, claudication, dizziness, presyncope, syncope, bleeding, or neurologic sequela. The patient is tolerating medications without difficulties.  Few weeks ago, she started to be more fatigued and short of breath.  She was out shopping with her daughter.  Her daughter felt her pulse and found that she was irregular.  She went home and checked on her cardiac monitor and was found to be in atrial fibrillation.  This is the first time she has been in atrial fibrillation since her cardioversion.   Past Medical History:  Diagnosis Date  . Dyspnea   . GERD (gastroesophageal reflux disease)   . Hypertension   . Joint pain   . Knee pain   . Lower extremity edema    Past Surgical History:  Procedure Laterality Date  . CARDIOVERSION N/A 03/21/2018   Procedure: CARDIOVERSION;  Surgeon: Sueanne Margarita, MD;  Location: [facility] ENDOSCOPY;  Service: Cardiovascular;  Laterality: N/A;  . CARDIOVERSION N/A 03/31/2018   Procedure: CARDIOVERSION;  Surgeon: Constance Haw, MD;  Location: [facility] ENDOSCOPY;  Service: Cardiovascular;  Laterality: N/A;      Current Outpatient Medications  Medication Sig Dispense Refill  . amLODipine-benazepril (LOTREL) 5-40 MG per capsule TAKE 1 CAPSULE BY MOUTH DAILY. 90 capsule 0  . apixaban (ELIQUIS) 5 MG TABS tablet TAKE 1 TABLET (5 MG TOTAL) BY MOUTH 2 (TWO) TIMES DAILY. 180 tablet 2  . buPROPion (WELLBUTRIN SR) 150 MG 12 hr tablet Take 1 tablet (150 mg total) by mouth daily. 30 tablet 0  . metoprolol tartrate (LOPRESSOR) 25 MG tablet Take 1 tablet (25 mg total) by mouth 2 (two) times daily. 90 tablet 0  . Vitamin D, Ergocalciferol, (DRISDOL) 1.25 MG (50000 UNIT) CAPS capsule Take 1 capsule (50,000 Units total) by mouth every 7 (seven) days. 4 capsule 0   No current facility-administered medications for this visit.    Allergies:   Patient has no known allergies.   Social History:  The patient  reports that she has never smoked. She has never used smokeless tobacco. She reports current alcohol use of about 9.0 standard drinks of alcohol per week. She reports that she does not use drugs.   Family History:  The patient's family history includes Atrial fibrillation in her mother; Bladder Cancer in her father; Diabetes in her brother, father, and mother; Healthy in her sister; Heart attack in her brother; Heart disease in her father; Hypertension in her father and mother.    ROS:  Please see the history of present illness.   Otherwise, review of systems is positive for none.   All other systems are reviewed  and negative.   PHYSICAL EXAM: VS:  BP 110/64   Pulse 78   Ht 5\' 7"  (1.702 m)   Wt 259 lb (117.5 kg)   LMP 04/09/2016 (Approximate)   SpO2 96%   BMI 40.57 kg/m  , BMI Body mass index is 40.57 kg/m. GEN: Well nourished, well developed, in no acute distress  HEENT: normal  Neck: no JVD, carotid bruits, or masses Cardiac: irregular; no murmurs, rubs, or gallops,no edema  Respiratory:  clear to auscultation bilaterally, normal work of breathing GI: soft, nontender, nondistended, + BS MS: no  deformity or atrophy  Skin: warm and dry Neuro:  Strength and sensation are intact Psych: euthymic mood, full affect  EKG:  EKG is ordered today. Personal review of the ekg ordered shows atrial fibrillation, rate 78  Recent Labs: 06/29/2019: ALT 17; BUN 17; Creatinine, Ser 0.96; Potassium 4.5; Sodium 139    Lipid Panel     Component Value Date/Time   CHOL 154 06/29/2019 1438   TRIG 57 06/29/2019 1438   HDL 59 06/29/2019 1438   CHOLHDL 2.8 05/08/2016 0829   LDLCALC 83 06/29/2019 1438     Wt Readings from Last 3 Encounters:  10/03/19 259 lb (117.5 kg)  07/12/19 248 lb (112.5 kg)  06/29/19 251 lb (113.9 kg)      Other studies Reviewed: Additional studies/ records that were reviewed today include: epic notes  ASSESSMENT AND PLAN:  1.  Persistent atrial fibrillation: Currently on metoprolol and Eliquis with a CHA2DS2-VASc of 2.  She is status post cardioversion and has previously remained in normal rhythm.  Unfortunately she has gone back into atrial fibrillation.  She has symptoms of weakness and fatigue.  She has been compliant with her anticoagulation.  We Levia Waltermire plan for cardioversion.  We Naje Rice also start 50 mg of flecainide.  2. Hypertension: Currently well controlled  3.  Snoring and daytime somnolence: We Jenah Vanasten discuss at next appointment.  Current medicines are reviewed at length with the patient today.   The patient does not have concerns regarding her medicines.  The following changes were made today: Start flecainide  Labs/ tests ordered today include:  Orders Placed This Encounter  Procedures  . Basic metabolic panel  . CBC  . EKG 12-Lead    Disposition:   FU with Andreya Lacks 3 months  Signed, Zaniel Marineau Meredith Leeds, MD  10/03/2019 11:51 AM     [facility] [address] [place] [address] [phone] (office) [phone] (fax)

## 2019-10-03 NOTE — Patient Instructions (Signed)
Medication Instructions:  Your physician has recommended you make the following change in your medication:  1. START Flecainide 50 mg twice daily  *If you need a refill on your cardiac medications before your next appointment, please call your pharmacy*   Lab Work: Pre procedure labs today: BMET & CBC If you have labs (blood work) drawn today and your tests are completely normal, you will receive your results only by: Marland Kitchen MyChart Message (if you have MyChart) OR . A paper copy in the mail If you have any lab test that is abnormal or we need to change your treatment, we will call you to review the results.   Testing/Procedures: Your physician has recommended that you have a Cardioversion (DCCV). Electrical Cardioversion uses a jolt of electricity to your heart either through paddles or wired patches attached to your chest. This is a controlled, usually prescheduled, procedure. Defibrillation is done under light anesthesia in the hospital, and you usually go home the day of the procedure. This is done to get your heart back into a normal rhythm. You are not awake for the procedure.   Arrive to [facility] at 7:00 am Friday morning.  Nothing after midnight.  Follow-Up: At [facility], you and your health needs are our priority.  As part of our continuing mission to provide you with exceptional heart care, we have created designated Provider Care Teams.  These Care Teams include your primary Cardiologist (physician) and Advanced Practice Providers (APPs -  Physician Assistants and Nurse Practitioners) who all work together to provide you with the care you need, when you need it.  We recommend signing up for the patient portal called "MyChart".  Sign up information is provided on this After Visit Summary.  MyChart is used to connect with patients for Virtual Visits (Telemedicine).  Patients are able to view lab/test results, encounter notes, upcoming appointments, etc.  Non-urgent messages can be sent to  your provider as well.   To learn more about what you can do with MyChart, go to [email or web address].    Your next appointment:   3 month(s)  The format for your next appointment:   In Person  Provider:   Allegra Lai, MD   Thank you for choosing [facility]!!   Trinidad Curet, RN [phone]    Other Instructions

## 2019-10-03 NOTE — Addendum Note (Signed)
Addended by: Stanton Kidney on: 10/03/2019 11:55 AM   Modules accepted: Orders

## 2019-10-05 ENCOUNTER — Other Ambulatory Visit (HOSPITAL_COMMUNITY)
Admission: RE | Admit: 2019-10-05 | Discharge: 2019-10-05 | Disposition: A | Discharge status: Home / Self Care | Payer: 59 | Source: Ambulatory Visit | Attending: Cardiology | Admitting: Cardiology

## 2019-10-05 ENCOUNTER — Other Ambulatory Visit: Payer: Self-pay

## 2019-10-05 ENCOUNTER — Ambulatory Visit (INDEPENDENT_AMBULATORY_CARE_PROVIDER_SITE_OTHER): Payer: 59 | Admitting: Adult Health

## 2019-10-05 ENCOUNTER — Encounter (INDEPENDENT_AMBULATORY_CARE_PROVIDER_SITE_OTHER): Payer: Self-pay | Admitting: Adult Health

## 2019-10-05 VITALS — BP 82/57 | HR 54 | Temp 97.9°F | Ht 67.0 in | Wt 253.0 lb

## 2019-10-05 DIAGNOSIS — Z01812 Encounter for preprocedural laboratory examination: Secondary | ICD-10-CM | POA: Diagnosis not present

## 2019-10-05 DIAGNOSIS — Z20822 Contact with and (suspected) exposure to covid-19: Secondary | ICD-10-CM | POA: Diagnosis not present

## 2019-10-05 DIAGNOSIS — E559 Vitamin D deficiency, unspecified: Secondary | ICD-10-CM

## 2019-10-05 DIAGNOSIS — I480 Paroxysmal atrial fibrillation: Secondary | ICD-10-CM

## 2019-10-05 DIAGNOSIS — Z6839 Body mass index (BMI) 39.0-39.9, adult: Secondary | ICD-10-CM | POA: Diagnosis not present

## 2019-10-05 LAB — SARS CORONAVIRUS 2 (TAT 6-24 HRS): SARS Coronavirus 2: NEGATIVE

## 2019-10-06 ENCOUNTER — Encounter (HOSPITAL_COMMUNITY): Payer: Self-pay | Admitting: Cardiology

## 2019-10-06 ENCOUNTER — Ambulatory Visit (HOSPITAL_COMMUNITY)
Admission: RE | Admit: 2019-10-06 | Discharge: 2019-10-06 | Disposition: A | Discharge status: Home / Self Care | Payer: 59 | Attending: Cardiology | Admitting: Cardiology

## 2019-10-06 ENCOUNTER — Ambulatory Visit (HOSPITAL_COMMUNITY): Payer: 59 | Admitting: Certified Registered Nurse Anesthetist

## 2019-10-06 ENCOUNTER — Encounter (HOSPITAL_COMMUNITY)
Admission: RE | Disposition: A | Discharge status: Home / Self Care | Payer: 59 | Source: Home / Self Care | Attending: Cardiology

## 2019-10-06 ENCOUNTER — Other Ambulatory Visit: Payer: Self-pay

## 2019-10-06 DIAGNOSIS — R400 Somnolence: Secondary | ICD-10-CM | POA: Diagnosis not present

## 2019-10-06 DIAGNOSIS — Z79899 Other long term (current) drug therapy: Secondary | ICD-10-CM | POA: Insufficient documentation

## 2019-10-06 DIAGNOSIS — I480 Paroxysmal atrial fibrillation: Secondary | ICD-10-CM | POA: Diagnosis not present

## 2019-10-06 DIAGNOSIS — Z6841 Body Mass Index (BMI) 40.0 and over, adult: Secondary | ICD-10-CM | POA: Diagnosis not present

## 2019-10-06 DIAGNOSIS — Z7901 Long term (current) use of anticoagulants: Secondary | ICD-10-CM | POA: Insufficient documentation

## 2019-10-06 DIAGNOSIS — I10 Essential (primary) hypertension: Secondary | ICD-10-CM | POA: Diagnosis not present

## 2019-10-06 DIAGNOSIS — E559 Vitamin D deficiency, unspecified: Secondary | ICD-10-CM | POA: Diagnosis not present

## 2019-10-06 DIAGNOSIS — I4819 Other persistent atrial fibrillation: Secondary | ICD-10-CM | POA: Insufficient documentation

## 2019-10-06 DIAGNOSIS — R0683 Snoring: Secondary | ICD-10-CM | POA: Insufficient documentation

## 2019-10-06 DIAGNOSIS — K219 Gastro-esophageal reflux disease without esophagitis: Secondary | ICD-10-CM | POA: Insufficient documentation

## 2019-10-06 HISTORY — PX: CARDIOVERSION: SHX1299

## 2019-10-06 SURGERY — CARDIOVERSION
Anesthesia: General

## 2019-10-06 MED ORDER — PROPOFOL 10 MG/ML IV BOLUS
INTRAVENOUS | Status: DC | PRN
  Administered 2019-10-06: 30 mg via INTRAVENOUS
  Administered 2019-10-06: 20 mg via INTRAVENOUS
  Administered 2019-10-06: 50 mg via INTRAVENOUS
  Administered 2019-10-06: 20 mg via INTRAVENOUS
  Administered 2019-10-06: 40 mg via INTRAVENOUS

## 2019-10-06 MED ORDER — SODIUM CHLORIDE 0.9 % IV SOLN: INTRAVENOUS | Status: DC | PRN

## 2019-10-06 NOTE — Transfer of Care (Signed)
Immediate Anesthesia Transfer of Care Note  Patient: Tonya Nicholson  Procedure(s) Performed: CARDIOVERSION (N/A )  Patient Location: PACU and Endoscopy Unit  Anesthesia Type:General  Level of Consciousness: awake, alert , oriented, patient cooperative and responds to stimulation  Airway & Oxygen Therapy: Patient Spontanous Breathing  Post-op Assessment: Report given to RN and Post -op Vital signs reviewed and stable  Post vital signs: Reviewed and stable  Last Vitals:  Vitals Value Taken Time  BP    Temp    Pulse    Resp    SpO2      Last Pain:  Vitals:   10/06/19 0710  TempSrc: Oral  PainSc: 0-No pain         Complications: No complications documented.

## 2019-10-06 NOTE — Procedures (Signed)
Electrical Cardioversion Procedure Note Tonya Nicholson [id number] 01/22/1961  Procedure: Electrical Cardioversion Indications:  Atrial Fibrillation  Procedure Details Consent: Risks of procedure as well as the alternatives and risks of each were explained to the (patient/caregiver).  Consent for procedure obtained. Time Out: Verified patient identification, verified procedure, site/side was marked, verified correct patient position, special equipment/implants available, medications/allergies/relevent history reviewed, required imaging and test results available.  Performed  Patient placed on cardiac monitor, pulse oximetry, supplemental oxygen as necessary.  Sedation given: propofol Pacer pads placed anterior and posterior chest.  Cardioverted 4 time(s).  Cardioverted at 200, 300, 360, and 360J with manual pressure.  Evaluation Findings: Post procedure EKG shows: NSR Complications: None Patient did tolerate procedure well.   Nelson Julson [employer] 10/06/2019, 7:52 AM

## 2019-10-06 NOTE — H&P (Signed)
Tonya Nicholson has presented today for surgery, with the diagnosis of atrial fibrillation.  The various methods of treatment have been discussed with the patient and family. After consideration of risks, benefits and other options for treatment, the patient has consented to  Procedure(s): cardioversion as a surgical intervention .  Risks include but not limited to arrhythmia, skin irritation, complications from anesthesia. The patient's history has been reviewed, patient examined, no change in status, stable for surgery.  I have reviewed the patient's chart and labs.  Questions were answered to the patient's satisfaction.    [name], MD 10/06/2019 7:13 AM

## 2019-10-06 NOTE — Anesthesia Preprocedure Evaluation (Signed)
Anesthesia Evaluation  Patient identified by MRN, date of birth, ID band Patient awake    Reviewed: Allergy & Precautions, NPO status , Patient's Chart, lab work & pertinent test results  History of Anesthesia Complications Negative for: history of anesthetic complications  Airway Mallampati: III  TM Distance: >3 FB Neck ROM: Full    Dental  (+) Teeth Intact, Dental Advisory Given   Pulmonary neg pulmonary ROS,  Covid-19 Nucleic Acid Test Results Lab Results      Component                Value               Date                      SARSCOV2NAA              NEGATIVE            10/05/2019              breath sounds clear to auscultation       Cardiovascular hypertension, Pt. on medications and Pt. on home beta blockers + dysrhythmias Atrial Fibrillation  Rhythm:Irregular Rate:Abnormal     Neuro/Psych PSYCHIATRIC DISORDERS Depression negative neurological ROS     GI/Hepatic Neg liver ROS, GERD  ,  Endo/Other  negative endocrine ROS  Renal/GU negative Renal ROSLab Results      Component                Value               Date                      CREATININE               0.80                10/03/2019           Lab Results      Component                Value               Date                      K                        4.5                 10/03/2019                Musculoskeletal negative musculoskeletal ROS (+)   Abdominal   Peds  Hematology negative hematology ROS (+) eliquis for afib  Lab Results      Component                Value               Date                      WBC                      8.5                 10/03/2019                HGB  14.5                10/03/2019                HCT                      43.3                10/03/2019                MCV                      89                  10/03/2019                PLT                      288                 10/03/2019               Anesthesia Other Findings   Reproductive/Obstetrics                             Anesthesia Physical Anesthesia Plan  ASA: II  Anesthesia Plan: General   Post-op Pain Management:    Induction: Intravenous  PONV Risk Score and Plan: 3 and Treatment may vary due to age or medical condition  Airway Management Planned: Mask  Additional Equipment: None  Intra-op Plan:   Post-operative Plan:   Informed Consent: I have reviewed the patients History and Physical, chart, labs and discussed the procedure including the risks, benefits and alternatives for the proposed anesthesia with the patient or authorized representative who has indicated his/her understanding and acceptance.     Dental advisory given  Plan Discussed with: CRNA  Anesthesia Plan Comments:         Anesthesia Quick Evaluation

## 2019-10-06 NOTE — Discharge Instructions (Signed)
Electrical Cardioversion Electrical cardioversion is the delivery of a jolt of electricity to restore a normal rhythm to the heart. A rhythm that is too fast or is not regular keeps the heart from pumping well. In this procedure, sticky patches or metal paddles are placed on the chest to deliver electricity to the heart from a device. This procedure may be done in an emergency if:  There is low or no blood pressure as a result of the heart rhythm.  Normal rhythm must be restored as fast as possible to protect the brain and heart from further damage.  It may save a life. This may also be a scheduled procedure for irregular or fast heart rhythms that are not immediately life-threatening. Tell a health care provider about:  Any allergies you have.  All medicines you are taking, including vitamins, herbs, eye drops, creams, and over-the-counter medicines.  Any problems you or family members have had with anesthetic medicines.  Any blood disorders you have.  Any surgeries you have had.  Any medical conditions you have.  Whether you are pregnant or may be pregnant. What are the risks? Generally, this is a safe procedure. However, problems may occur, including:  Allergic reactions to medicines.  A blood clot that breaks free and travels to other parts of your body.  The possible return of an abnormal heart rhythm within hours or days after the procedure.  Your heart stopping (cardiac arrest). This is rare. What happens before the procedure? Medicines  Your health care provider may have you start taking: ? Blood-thinning medicines (anticoagulants) so your blood does not clot as easily. ? Medicines to help stabilize your heart rate and rhythm.  Ask your health care provider about: ? Changing or stopping your regular medicines. This is especially important if you are taking diabetes medicines or blood thinners. ? Taking medicines such as aspirin and ibuprofen. These medicines can  thin your blood. Do not take these medicines unless your health care provider tells you to take them. ? Taking over-the-counter medicines, vitamins, herbs, and supplements. General instructions  Follow instructions from your health care provider about eating or drinking restrictions.  Plan to have someone take you home from the hospital or clinic.  If you will be going home right after the procedure, plan to have someone with you for 24 hours.  Ask your health care provider what steps will be taken to help prevent infection. These may include washing your skin with a germ-killing soap. What happens during the procedure?   An IV will be inserted into one of your veins.  Sticky patches (electrodes) or metal paddles may be placed on your chest.  You will be given a medicine to help you relax (sedative).  An electrical shock will be delivered. The procedure may vary among health care providers and hospitals. What can I expect after the procedure?  Your blood pressure, heart rate, breathing rate, and blood oxygen level will be monitored until you leave the hospital or clinic.  Your heart rhythm will be watched to make sure it does not change.  You may have some redness on the skin where the shocks were given. Follow these instructions at home:  Do not drive for 24 hours if you were given a sedative during your procedure.  Take over-the-counter and prescription medicines only as told by your health care provider.  Ask your health care provider how to check your pulse. Check it often.  Rest for 48 hours after the procedure or   as told by your health care provider.  Avoid or limit your caffeine use as told by your health care provider.  Keep all follow-up visits as told by your health care provider. This is important. Contact a health care provider if:  You feel like your heart is beating too quickly or your pulse is not regular.  You have a serious muscle cramp that does not go  away. Get help right away if:  You have discomfort in your chest.  You are dizzy or you feel faint.  You have trouble breathing or you are short of breath.  Your speech is slurred.  You have trouble moving an arm or leg on one side of your body.  Your fingers or toes turn cold or blue. Summary  Electrical cardioversion is the delivery of a jolt of electricity to restore a normal rhythm to the heart.  This procedure may be done right away in an emergency or may be a scheduled procedure if the condition is not an emergency.  Generally, this is a safe procedure.  After the procedure, check your pulse often as told by your health care provider. This information is not intended to replace advice given to you by your health care provider. Make sure you discuss any questions you have with your health care provider. Document Revised: 08/15/2018 Document Reviewed: 08/15/2018 Elsevier Patient Education  2020 [employer]  

## 2019-10-09 NOTE — Anesthesia Postprocedure Evaluation (Signed)
Anesthesia Post Note  Patient: Tonya Nicholson  Procedure(s) Performed: CARDIOVERSION (N/A )     Patient location during evaluation: Endoscopy Anesthesia Type: General Level of consciousness: awake and alert Pain management: pain level controlled Vital Signs Assessment: post-procedure vital signs reviewed and stable Respiratory status: spontaneous breathing, nonlabored ventilation, respiratory function stable and patient connected to nasal cannula oxygen Cardiovascular status: stable Postop Assessment: no apparent nausea or vomiting Anesthetic complications: no   No complications documented.  Last Vitals:  Vitals:   10/06/19 0815 10/06/19 0820  BP: 116/67 117/68  Pulse: (!) 55 (!) 55  Resp: 11 15  Temp:    SpO2: 100% 100%    Last Pain:  Vitals:   10/06/19 0820  TempSrc:   PainSc: 0-No pain                 Nuri Branca

## 2019-10-09 NOTE — Interval H&P Note (Signed)
History and Physical Interval Note:  10/09/2019 10:42 AM  Tonya Nicholson  has presented today for surgery, with the diagnosis of afib.  The various methods of treatment have been discussed with the patient and family. After consideration of risks, benefits and other options for treatment, the patient has consented to  Procedure(s): CARDIOVERSION (N/A) as a surgical intervention.  The patient's history has been reviewed, patient examined, no change in status, stable for surgery.  I have reviewed the patient's chart and labs.  Questions were answered to the patient's satisfaction.     Tonya Nicholson [employer]

## 2019-10-09 NOTE — Progress Notes (Signed)
Chief Complaint:   OBESITY Marine is here to discuss her progress with her obesity treatment plan along with follow-up of her obesity related diagnoses. Rylynne is on the Category 2 Plan and states she is following her eating plan some of the time. Iliyana states she is doing cardio and strength training for 45-60 minutes 7 times per week.  Today's visit was #: 59 Starting weight: 263 lbs Starting date: 02/24/2018 Today's weight: 253 lbs Today's date: 10/05/2019 Total lbs lost to date: 10 lbs Total lbs lost since last in-office visit: 0  Interim History: Keyara has been experiencing increased fatigue and dyspnea and was found to be back in atrial fibrillation.  She was seen by Cardiology, Dr. Curt Bears, on 10/03/2019, and was started on an antiarrhythmic and will have a cardioversion tomorrow.  She will be traveling to [facility] after the procedure and has firm plans to stay on track - meal prep/planning and access to full kitchen while on vacation.  She has several family members in healthcare that will be traveling with her to [place].  Subjective:   1. PAF (paroxysmal atrial fibrillation) (HCC) Increased fatigue and dyspnea.  She was seen by Cariology, Dr. Curt Bears, on 10/03/2019, and was started on Flecainide.  She is scheduled for cardioversion on 10/06/2019.  She is on multiple cardiac medications.  She denies acute cardiac symptoms at present.  2. Vitamin D deficiency Adaora's Vitamin D level was 48.7 on 06/29/2019. She is currently taking prescription vitamin D 50,000 IU each week. She denies nausea, vomiting or muscle weakness.  Last vitamin D level was below goal.  Assessment/Plan:   1. PAF (paroxysmal atrial fibrillation) [facility]) Follow-up with Cardiology as directed.  If red flag symptoms develop, seek immediate medical assistance.  She reiterated understanding and agreement.  Discuss antihypertensive therapy with Cardiology.   2. Vitamin D deficiency Low Vitamin D level  contributes to fatigue and are associated with obesity, breast, and colon cancer. She agrees to continue to take prescription Vitamin D @50 ,000 IU every week and will follow-up for routine testing of Vitamin D, at least 2-3 times per year to avoid over-replacement.  3. Class 2 severe obesity with serious comorbidity and body mass index (BMI) of 39.0 to 39.9 in adult, unspecified obesity type (HCC) Danicia is currently in the action stage of change. As such, her goal is to continue with weight loss efforts. She has agreed to the Category 2 Plan.   Exercise goals: As is.  Behavioral modification strategies: increasing lean protein intake, meal planning and cooking strategies, travel eating strategies and planning for success.  Jaclyn has agreed to follow-up with our clinic in 3 weeks. She was informed of the importance of frequent follow-up visits to maximize her success with intensive lifestyle modifications for her multiple health conditions.   Objective:   Blood pressure (!) 82/57, pulse (!) 54, temperature 97.9 F (36.6 C), temperature source Oral, height 5\' 7"  (1.702 m), weight 253 lb (114.8 kg), last menstrual period 04/09/2016, SpO2 96 %. Body mass index is 39.63 kg/m.  General: Cooperative, alert, well developed, in no acute distress. HEENT: Conjunctivae and lids unremarkable. Cardiovascular: Regular rhythm.  Lungs: Normal work of breathing. Neurologic: No focal deficits.   Lab Results  Component Value Date   CREATININE 0.80 10/03/2019   BUN 16 10/03/2019   NA 138 10/03/2019   K 4.5 10/03/2019   CL 103 10/03/2019   CO2 21 10/03/2019   Lab Results  Component Value Date  ALT 17 06/29/2019   AST 14 06/29/2019   ALKPHOS 75 06/29/2019   BILITOT 0.4 06/29/2019   Lab Results  Component Value Date   HGBA1C 5.7 (H) 06/29/2019   HGBA1C 5.7 (H) 01/31/2019   HGBA1C 6.2 (H) 02/24/2018   HGBA1C 6.4 (H) 05/08/2016   Lab Results  Component Value Date   INSULIN 14.5 06/29/2019    INSULIN 8.2 01/31/2019   INSULIN 10.7 02/24/2018   Lab Results  Component Value Date   TSH 1.410 02/24/2018   Lab Results  Component Value Date   CHOL 154 06/29/2019   HDL 59 06/29/2019   LDLCALC 83 06/29/2019   TRIG 57 06/29/2019   CHOLHDL 2.8 05/08/2016   Lab Results  Component Value Date   WBC 8.5 10/03/2019   HGB 14.5 10/03/2019   HCT 43.3 10/03/2019   MCV 89 10/03/2019   PLT 288 10/03/2019   Attestation Statements:   Reviewed by clinician on day of visit: allergies, medications, problem list, medical history, surgical history, family history, social history, and previous encounter notes.  Time spent on visit including pre-visit chart review and post-visit care and charting was 27 minutes.   I, Water quality scientist, CMA, am acting as Location manager for Mina Marble, NP.  I have reviewed the above documentation for accuracy and completeness, and I agree with the above. -  Esaw Grandchild, NP

## 2019-10-10 ENCOUNTER — Encounter (HOSPITAL_COMMUNITY): Payer: Self-pay | Admitting: Cardiology

## 2019-10-26 ENCOUNTER — Encounter (INDEPENDENT_AMBULATORY_CARE_PROVIDER_SITE_OTHER): Payer: Self-pay | Admitting: Adult Health

## 2019-10-26 ENCOUNTER — Ambulatory Visit (INDEPENDENT_AMBULATORY_CARE_PROVIDER_SITE_OTHER): Payer: 59 | Admitting: Adult Health

## 2019-10-26 ENCOUNTER — Other Ambulatory Visit: Payer: Self-pay

## 2019-10-26 VITALS — BP 95/61 | HR 55 | Temp 98.2°F | Ht 67.0 in | Wt 257.0 lb

## 2019-10-26 DIAGNOSIS — F3289 Other specified depressive episodes: Secondary | ICD-10-CM

## 2019-10-26 DIAGNOSIS — Z6841 Body Mass Index (BMI) 40.0 and over, adult: Secondary | ICD-10-CM | POA: Diagnosis not present

## 2019-10-26 DIAGNOSIS — Z9189 Other specified personal risk factors, not elsewhere classified: Secondary | ICD-10-CM | POA: Diagnosis not present

## 2019-10-26 DIAGNOSIS — E559 Vitamin D deficiency, unspecified: Secondary | ICD-10-CM | POA: Diagnosis not present

## 2019-10-26 MED ORDER — VITAMIN D (ERGOCALCIFEROL) 1.25 MG (50000 UNIT) PO CAPS: 50000.0000 [IU] | ORAL_CAPSULE | ORAL | 0 refills | Status: DC

## 2019-10-26 MED FILL — AMLODIPINE-BENAZEPRIL 5-40: 5-40 | 30 days supply | Qty: 30 | Fill #2

## 2019-10-26 MED FILL — VIT D2 1.25 MG (50,000 UNIT: 1.25 MG | 28 days supply | Qty: 4 | Fill #0

## 2019-10-27 MED FILL — FLECAINIDE ACETATE 50 MG TA: 50 | 30 days supply | Qty: 60 | Fill #1

## 2019-10-30 NOTE — Progress Notes (Signed)
Chief Complaint:   OBESITY Tonya Nicholson is here to discuss her progress with her obesity treatment plan along with follow-up of her obesity related diagnoses. Tonya Nicholson is on the Category 2 Plan and states she is following her eating plan approximately 50% of the time. Tonya Nicholson states she walked on vacation for exercise.  Today's visit was #: 93 Starting weight: 263 lbs Starting date: 02/24/2018 Today's weight: 257 lbs Today's date: 10/26/2019 Total lbs lost to date: 6 Total lbs lost since last in-office visit: 0  Interim History: Tonya Nicholson had cardioversion 10/06/2019. She denies cardiac symptoms and states "I feel great." She then traveled to [place] and [place] for 2.5 weeks. She meal planned/prepped and ate most meals at the rental house. She went to [place] 3 times during vacation and walked the beach daily. She did enjoy snacks and cocktails while traveling. She is home now and ready to get back on track!  Subjective:   Vitamin D deficiency. Tonya Nicholson is on Ergocalciferol. No nausea, vomiting, or muscle weakness.    Ref. Range 06/29/2019 14:38  Vitamin D, 25-Hydroxy Latest Ref Range: 30.0 - 100.0 ng/mL 48.7   Other depression, with emotional eating. Tonya Nicholson is struggling with emotional eating and using food for comfort to the extent that it is negatively impacting her health. She has been working on behavior modification techniques to help reduce her emotional eating and has been somewhat successful. She shows no sign of suicidal or homicidal ideations. Blood pressure and heart rate are excellent on today's office visit. Tonya Nicholson reports cravings are well controlled without bupropion as long as "the sweets aren't in front of me." She is on flecainide for PAF.  At risk for osteoporosis. Tonya Nicholson is at higher risk of osteopenia and osteoporosis due to Vitamin D deficiency and obesity.  Assessment/Plan:   Vitamin D deficiency. Low Vitamin D level contributes to fatigue and are associated with  obesity, breast, and colon cancer. She was given a refill on her Vitamin D, Ergocalciferol, (DRISDOL) 1.25 MG (50000 UNIT) CAPS capsule every week #4 with 0 refills and will follow-up for routine testing of Vitamin D, at least 2-3 times per year to avoid over-replacement.   Other depression, with emotional eating. Behavior modification techniques were discussed today to help Tonya Nicholson deal with her emotional/non-hunger eating behaviors.  Orders and follow up as documented in patient record. Will defer restarting bupropion until clearance from Cardiology regarding flecainide safety with bupropion.  At risk for osteoporosis. Tonya Nicholson was given approximately 15 minutes of osteoporosis prevention counseling today. Tonya Nicholson is at risk for osteopenia and osteoporosis due to her Vitamin D deficiency. She was encouraged to take her Vitamin D and follow her higher calcium diet and increase strengthening exercise to help strengthen her bones and decrease her risk of osteopenia and osteoporosis.  Repetitive spaced learning was employed today to elicit superior memory formation and behavioral change.  Class 3 severe obesity with serious comorbidity and body mass index (BMI) of 40.0 to 44.9 in adult, unspecified obesity type ([place]).  Tonya Nicholson is currently in the action stage of change. As such, her goal is to continue with weight loss efforts. She has agreed to the Category 2 Plan.   Exercise goals: Walking as tolerated  Behavioral modification strategies: increasing lean protein intake, decreasing simple carbohydrates, decreasing liquid calories, meal planning and cooking strategies and planning for success.  Tonya Nicholson has agreed to follow-up with our clinic fasting in 2-3 weeks. She was informed of the importance of frequent follow-up  visits to maximize her success with intensive lifestyle modifications for her multiple health conditions.   Objective:   Blood pressure 95/61, pulse (!) 55, temperature 98.2 F (36.8 C),  height 5\' 7"  (1.702 m), weight 257 lb (116.6 kg), last menstrual period 04/09/2016, SpO2 98 %. Body mass index is 40.25 kg/m.  General: Cooperative, alert, well developed, in no acute distress. HEENT: Conjunctivae and lids unremarkable. Cardiovascular: Regular rhythm.  Lungs: Normal work of breathing. Neurologic: No focal deficits.   Lab Results  Component Value Date   CREATININE 0.80 10/03/2019   BUN 16 10/03/2019   NA 138 10/03/2019   K 4.5 10/03/2019   CL 103 10/03/2019   CO2 21 10/03/2019   Lab Results  Component Value Date   ALT 17 06/29/2019   AST 14 06/29/2019   ALKPHOS 75 06/29/2019   BILITOT 0.4 06/29/2019   Lab Results  Component Value Date   HGBA1C 5.7 (H) 06/29/2019   HGBA1C 5.7 (H) 01/31/2019   HGBA1C 6.2 (H) 02/24/2018   HGBA1C 6.4 (H) 05/08/2016   Lab Results  Component Value Date   INSULIN 14.5 06/29/2019   INSULIN 8.2 01/31/2019   INSULIN 10.7 02/24/2018   Lab Results  Component Value Date   TSH 1.410 02/24/2018   Lab Results  Component Value Date   CHOL 154 06/29/2019   HDL 59 06/29/2019   LDLCALC 83 06/29/2019   TRIG 57 06/29/2019   CHOLHDL 2.8 05/08/2016   Lab Results  Component Value Date   WBC 8.5 10/03/2019   HGB 14.5 10/03/2019   HCT 43.3 10/03/2019   MCV 89 10/03/2019   PLT 288 10/03/2019   No results found for: IRON, TIBC, FERRITIN  Attestation Statements:   Reviewed by clinician on day of visit: allergies, medications, problem list, medical history, surgical history, family history, social history, and previous encounter notes.  I, Michaelene Song, am acting as Location manager for [employer], NP-C   I have reviewed the above documentation for accuracy and completeness, and I agree with the above. -  Esaw Grandchild, NP

## 2019-11-16 ENCOUNTER — Ambulatory Visit (INDEPENDENT_AMBULATORY_CARE_PROVIDER_SITE_OTHER): Payer: 59 | Admitting: Adult Health

## 2019-11-25 ENCOUNTER — Ambulatory Visit: Payer: 59 | Attending: Internal Medicine

## 2019-11-25 DIAGNOSIS — Z23 Encounter for immunization: Secondary | ICD-10-CM

## 2019-11-25 NOTE — Progress Notes (Signed)
   Covid-19 Vaccination Clinic  Name:  Tonya Nicholson    MRN: [id number] DOB: Jun 08, 1960  11/25/2019  Tonya Nicholson was observed post Covid-19 immunization for 15 minutes without incident. She was provided with Vaccine Information Sheet and instruction to access the V-Safe system.   Tonya Nicholson was instructed to call 911 with any severe reactions post vaccine: Marland Kitchen Difficulty breathing  . Swelling of face and throat  . A fast heartbeat  . A bad rash all over body  . Dizziness and weakness

## 2019-11-27 MED FILL — FLECAINIDE ACETATE 50 MG TA: 50 | 30 days supply | Qty: 60 | Fill #2

## 2019-11-29 ENCOUNTER — Other Ambulatory Visit (HOSPITAL_COMMUNITY): Payer: Self-pay | Admitting: Obstetrics & Gynecology

## 2019-11-29 MED FILL — AMLODIPINE-BENAZEPRIL 5-40: 5-40 | 30 days supply | Qty: 30 | Fill #0

## 2019-11-30 ENCOUNTER — Ambulatory Visit (INDEPENDENT_AMBULATORY_CARE_PROVIDER_SITE_OTHER): Payer: 59 | Admitting: Adult Health

## 2019-11-30 DIAGNOSIS — H5213 Myopia, bilateral: Secondary | ICD-10-CM | POA: Diagnosis not present

## 2019-12-20 ENCOUNTER — Other Ambulatory Visit: Payer: Self-pay | Admitting: Cardiology

## 2019-12-20 MED FILL — METOPROLOL TARTRATE 25 MG T: 25 | 90 days supply | Qty: 180 | Fill #2

## 2019-12-20 MED FILL — ELIQUIS 5 MG TABLET: 5 | 90 days supply | Qty: 180 | Fill #0

## 2019-12-20 NOTE — Telephone Encounter (Signed)
Prescription refill request for Eliquis received.  Last office visit: Camnitz, 10/03/2019 Scr: 0.80, 10/03/2019 Age: 59 yo Weight: 116.6 kg   Prescription refill sent.

## 2019-12-25 MED FILL — AMLODIPINE-BENAZEPRIL 5-40: 5-40 | 30 days supply | Qty: 30 | Fill #1

## 2019-12-25 MED FILL — FLECAINIDE ACETATE 50 MG TA: 50 | 30 days supply | Qty: 60 | Fill #3

## 2019-12-27 ENCOUNTER — Ambulatory Visit (INDEPENDENT_AMBULATORY_CARE_PROVIDER_SITE_OTHER): Payer: 59 | Admitting: Adult Health

## 2020-01-11 ENCOUNTER — Other Ambulatory Visit: Payer: Self-pay

## 2020-01-11 ENCOUNTER — Encounter: Payer: Self-pay | Admitting: Cardiology

## 2020-01-11 ENCOUNTER — Ambulatory Visit (INDEPENDENT_AMBULATORY_CARE_PROVIDER_SITE_OTHER): Payer: 59 | Admitting: Cardiology

## 2020-01-11 VITALS — BP 122/76 | HR 49 | Ht 67.0 in | Wt 261.2 lb

## 2020-01-11 DIAGNOSIS — I4819 Other persistent atrial fibrillation: Secondary | ICD-10-CM

## 2020-01-11 NOTE — Progress Notes (Signed)
Electrophysiology Office Note   Date:  01/11/2020   ID:  Tonya Nicholson, DOB 01/14/1961, MRN [id number]  PCP:  Linda Hedges, DO  Cardiologist:   Primary Electrophysiologist:  Angelgabriel Willmore Meredith Leeds, MD    No chief complaint on file.    History of Present Illness: Tonya Nicholson is a 59 y.o. female who is being seen today for the evaluation of atrial fibrillation at the request of Tonya, [name], DO. Presenting today for electrophysiology evaluation.    She has a history significant for morbid obesity and hypertension.  She initially presented to her primary physician's office and was found to be in atrial fibrillation.  Her symptoms were not fatigue.  She was put on Eliquis and metoprolol and had a cardioversion.  She remained in sinus rhythm for quite some time but did go back into atrial fibrillation and has now been started on flecainide.  Today, denies symptoms of palpitations, chest pain, shortness of breath, orthopnea, PND, lower extremity edema, claudication, dizziness, presyncope, syncope, bleeding, or neurologic sequela. The patient is tolerating medications without difficulties.  Overall she is feeling well.  She has no chest pain or shortness of breath.  She has not had any further episodes of fatigue as she had previously.  She feels that she is remained in sinus rhythm.   Past Medical History:  Diagnosis Date  . Dyspnea   . GERD (gastroesophageal reflux disease)   . Hypertension   . Joint pain   . Knee pain   . Lower extremity edema    Past Surgical History:  Procedure Laterality Date  . CARDIOVERSION N/A 03/21/2018   Procedure: CARDIOVERSION;  Surgeon: Tonya Margarita, MD;  Location: [facility] ENDOSCOPY;  Service: Cardiovascular;  Laterality: N/A;  . CARDIOVERSION N/A 03/31/2018   Procedure: CARDIOVERSION;  Surgeon: Tonya Haw, MD;  Location: [place];  Service: Cardiovascular;  Laterality: N/A;  . CARDIOVERSION N/A 10/06/2019   Procedure: CARDIOVERSION;   Surgeon: Tonya Haw, MD;  Location: [facility] ENDOSCOPY;  Service: Cardiovascular;  Laterality: N/A;     Current Outpatient Medications  Medication Sig Dispense Refill  . amLODipine-benazepril (LOTREL) 5-40 MG per capsule TAKE 1 CAPSULE BY MOUTH DAILY. (Patient taking differently: Take 1 capsule by mouth daily.) 90 capsule 0  . buPROPion (WELLBUTRIN SR) 150 MG 12 hr tablet Take 1 tablet (150 mg total) by mouth daily. 30 tablet 0  . ELIQUIS 5 MG TABS tablet TAKE 1 TABLET (5 MG TOTAL) BY MOUTH 2 (TWO) TIMES DAILY. 180 tablet 1  . flecainide (TAMBOCOR) 50 MG tablet Take 1 tablet (50 mg total) by mouth 2 (two) times daily. 60 tablet 3  . metoprolol tartrate (LOPRESSOR) 25 MG tablet Take 1 tablet (25 mg total) by mouth 2 (two) times daily. 90 tablet 0  . Vitamin D, Ergocalciferol, (DRISDOL) 1.25 MG (50000 UNIT) CAPS capsule Take 1 capsule (50,000 Units total) by mouth every 7 (seven) days. 4 capsule 0   No current facility-administered medications for this visit.    Allergies:   Patient has no known allergies.   Social History:  The patient  reports that she has never smoked. She has never used smokeless tobacco. She reports current alcohol use of about 9.0 standard drinks of alcohol per week. She reports that she does not use drugs.   Family History:  The patient's family history includes Atrial fibrillation in her mother; Bladder Cancer in her father; Diabetes in her brother, father, and mother; Healthy in her sister; Heart attack in  her brother; Heart disease in her father; Hypertension in her father and mother.   ROS:  Please see the history of present illness.   Otherwise, review of systems is positive for none.   All other systems are reviewed and negative.   PHYSICAL EXAM: VS:  BP 122/76   Pulse (!) 49   Ht 5\' 7"  (1.702 m)   Wt 261 lb 3.2 oz (118.5 kg)   LMP 04/09/2016 (Approximate)   SpO2 98%   BMI 40.91 kg/m  , BMI Body mass index is 40.91 kg/m. GEN: Well nourished, well  developed, in no acute distress  HEENT: normal  Neck: no JVD, carotid bruits, or masses Cardiac: RRR; no murmurs, rubs, or gallops,no edema  Respiratory:  clear to auscultation bilaterally, normal work of breathing GI: soft, nontender, nondistended, + BS MS: no deformity or atrophy  Skin: warm and dry Neuro:  Strength and sensation are intact Psych: euthymic mood, full affect  EKG:  EKG is ordered today. Personal review of the ekg ordered shows sinus rhythm, rate 49  Recent Labs: 06/29/2019: ALT 17 10/03/2019: BUN 16; Creatinine, Ser 0.80; Hemoglobin 14.5; Platelets 288; Potassium 4.5; Sodium 138    Lipid Panel     Component Value Date/Time   CHOL 154 06/29/2019 1438   TRIG 57 06/29/2019 1438   HDL 59 06/29/2019 1438   CHOLHDL 2.8 05/08/2016 0829   LDLCALC 83 06/29/2019 1438     Wt Readings from Last 3 Encounters:  01/11/20 261 lb 3.2 oz (118.5 kg)  10/26/19 257 lb (116.6 kg)  10/06/19 253 lb (114.8 kg)      Other studies Reviewed: Additional studies/ records that were reviewed today include: TTE 03/04/2018 1. The left ventricle has normal systolic function of 12-87%. The cavity  size was normal. There is no increased left ventricular wall thickness.  Left ventricular diastology could not be evaluated secondary to atrial  fibrillation.  2. The right ventricle has normal systolic function. The cavity was  normal. There is no increase in right ventricular wall thickness.  3. Left atrial size was mildly dilated.  4. Normal RVSP  5. The inferior vena cava was dilated in size with <50% respiratory  variability.   ASSESSMENT AND PLAN:  1.  Persistent atrial fibrillation: Currently on metoprolol, Eliquis, flecainide.  High risk medication monitoring.  CHA2DS2-VASc of 2.  She is fortunately remained in sinus rhythm.  We Ryelynn Guedea continue with current dosing.  2.  Hypertension: Currently well controlled  3.  Snoring and daytime somnolence: She would like to wait till at  least after the holidays.  She Tonya Nicholson let us know when she is ready for sleep study.  Current medicines are reviewed at length with the patient today.   The patient does not have concerns regarding her medicines.  The following changes were made today: None  Labs/ tests ordered today include:  Orders Placed This Encounter  Procedures  . EKG 12-Lead    Disposition:   FU with Marlea Gambill 6 months  Signed, Rahul Malinak Meredith Leeds, MD  01/11/2020 9:05 AM     [facility] [address] [place] [phone] (office) [phone] (fax)

## 2020-01-11 NOTE — Patient Instructions (Addendum)
Medication Instructions:  °Your physician recommends that you continue on your current medications as directed. Please refer to the Current Medication list given to you today. ° °*If you need a refill on your cardiac medications before your next appointment, please call your pharmacy* ° ° °Lab Work: °None ordered ° ° °Testing/Procedures: °None ordered ° ° °Follow-Up: °At [facility], you and your health needs are our priority.  As part of our continuing mission to provide you with exceptional heart care, we have created designated Provider Care Teams.  These Care Teams include your primary Cardiologist (physician) and Advanced Practice Providers (APPs -  Physician Assistants and Nurse Practitioners) who all work together to provide you with the care you need, when you need it. ° °Your next appointment:   °6 month(s) ° °The format for your next appointment:   °In Person ° °Provider:   °Will Camnitz, MD ° ° ° °Thank you for choosing [facility]!! ° ° °Annielee Jemmott, RN °[phone] °  °

## 2020-01-22 MED FILL — AMLODIPINE-BENAZEPRIL 5-40: 5-40 | 30 days supply | Qty: 30 | Fill #2

## 2020-01-23 ENCOUNTER — Other Ambulatory Visit: Payer: Self-pay | Admitting: Cardiology

## 2020-01-23 MED FILL — FLECAINIDE ACETATE 50 MG TA: 50 | 30 days supply | Qty: 60 | Fill #0

## 2020-02-01 ENCOUNTER — Ambulatory Visit (INDEPENDENT_AMBULATORY_CARE_PROVIDER_SITE_OTHER): Payer: 59 | Admitting: Adult Health

## 2020-02-01 ENCOUNTER — Other Ambulatory Visit: Payer: Self-pay

## 2020-02-01 ENCOUNTER — Other Ambulatory Visit (INDEPENDENT_AMBULATORY_CARE_PROVIDER_SITE_OTHER): Payer: Self-pay | Admitting: Adult Health

## 2020-02-01 ENCOUNTER — Encounter (INDEPENDENT_AMBULATORY_CARE_PROVIDER_SITE_OTHER): Payer: Self-pay | Admitting: Adult Health

## 2020-02-01 VITALS — BP 102/68 | HR 54 | Temp 97.6°F | Ht 67.0 in | Wt 262.0 lb

## 2020-02-01 DIAGNOSIS — I10 Essential (primary) hypertension: Secondary | ICD-10-CM | POA: Diagnosis not present

## 2020-02-01 DIAGNOSIS — Z6841 Body Mass Index (BMI) 40.0 and over, adult: Secondary | ICD-10-CM | POA: Diagnosis not present

## 2020-02-01 DIAGNOSIS — E559 Vitamin D deficiency, unspecified: Secondary | ICD-10-CM | POA: Diagnosis not present

## 2020-02-01 DIAGNOSIS — R7303 Prediabetes: Secondary | ICD-10-CM | POA: Diagnosis not present

## 2020-02-01 DIAGNOSIS — Z9189 Other specified personal risk factors, not elsewhere classified: Secondary | ICD-10-CM | POA: Diagnosis not present

## 2020-02-01 MED ORDER — VITAMIN D (ERGOCALCIFEROL) 1.25 MG (50000 UNIT) PO CAPS: 50000.0000 [IU] | ORAL_CAPSULE | ORAL | 0 refills | Status: DC

## 2020-02-01 MED FILL — VIT D2 1.25 MG (50,000 UNIT: 1.25 MG | 28 days supply | Qty: 4 | Fill #0

## 2020-02-05 NOTE — Progress Notes (Signed)
Chief Complaint:   OBESITY Tonya Nicholson is here to discuss her progress with her obesity treatment plan along with follow-up of her obesity related diagnoses. Tonya Nicholson is on the Category 2 Plan and states she is following her eating plan approximately 50% of the time. Tonya Nicholson states she is going to the gym 30 minutes 4 times per week.  Today's visit was #: 28 Starting weight: 263 lbs Starting date: 02/24/2018 Today's weight: 262 lbs Today's date: 02/01/2020 Total lbs lost to date: 1 lb Total lbs lost since last in-office visit: 0 lb  Interim History: Tonya Nicholson feels that she did quite well with weight loss in 2020, then the pandemic hit and telemedicine visits were not effective. In 2022 she is ready to re-focus "and get this weight off!"  Subjective:   1. Vitamin D deficiency Tonya Nicholson's Vitamin D level was 48.7 on 06/29/2019. She is currently taking prescription vitamin D 50,000 IU each week. She denies nausea, vomiting or muscle weakness.  Ref. Range 06/29/2019 14:38  Vitamin D, 25-Hydroxy Latest Ref Range: 30.0 - 100.0 ng/mL 48.7   2. Pre-diabetes Tonya Nicholson has a diagnosis of prediabetes based on her elevated HgA1c and was informed this puts her at greater risk of developing diabetes. She continues to work on diet and exercise to decrease her risk of diabetes. She denies nausea or hypoglycemia. 06/29/2019 A1c 5.7, blood glucose 126, insulin level 14.5. She reports occasional polyphagia. She is not on Metformin.  Lab Results  Component Value Date   HGBA1C 5.7 (H) 06/29/2019   Lab Results  Component Value Date   INSULIN 14.5 06/29/2019   INSULIN 8.2 01/31/2019   INSULIN 10.7 02/24/2018    3. Essential hypertension Tonya Nicholson's blood pressure and heart rate are stable at OV. She denies excessive fatigue, dizziness with position changes. She is on Lotrel 5/40 mg daily, and Lopressor 25 mg BID. Tonya Nicholson is followed by Dr. Curt Bears at cardiology.  BP Readings from Last 3 Encounters:  02/01/20 102/68  01/11/20  122/76  10/26/19 95/61    4. At risk for osteoporosis Tonya Nicholson is at higher risk of osteopenia and osteoporosis due to Vitamin D deficiency and obesity.  Assessment/Plan:   1. Vitamin D deficiency Low Vitamin D level contributes to fatigue and are associated with obesity, breast, and colon cancer. She agrees to continue to take prescription Vitamin D @50 ,000 IU every week and will follow-up for routine testing of Vitamin D, at least 2-3 times per year to avoid over-replacement. Check labs at next OV.  - Vitamin D, Ergocalciferol, (DRISDOL) 1.25 MG (50000 UNIT) CAPS capsule; Take 1 capsule (50,000 Units total) by mouth every 7 (seven) days.  Dispense: 4 capsule; Refill: 0  2. Pre-diabetes [name] will continue to work on weight loss, exercise, and decreasing simple carbohydrates to help decrease the risk of diabetes. Check labs at next OV.  3. Essential hypertension [name] is working on healthy weight loss and exercise to improve blood pressure control. We will watch for signs of hypotension as she continues her lifestyle modifications. Continue current treatment plan. Check labs at next OV.  4. At risk for osteoporosis [name] was given approximately 15 minutes of osteoporosis prevention counseling today. Tonya Nicholson is at risk for osteopenia and osteoporosis due to her Vitamin D deficiency. She was encouraged to take her Vitamin D and follow her higher calcium diet and increase strengthening exercise to help strengthen her bones and decrease her risk of osteopenia and osteoporosis.  Repetitive spaced learning was employed today to elicit  superior memory formation and behavioral change.  5. Class 3 severe obesity with serious comorbidity and body mass index (BMI) of 40.0 to 44.9 in adult, unspecified obesity type (HCC) Tonya Nicholson is currently in the action stage of change. As such, her goal is to continue with weight loss efforts. She has agreed to the Category 2 Plan.   Increase protein each day.  Consistently follow category 2 meal plan.  Exercise goals: As is  Behavioral modification strategies: increasing lean protein intake, decreasing simple carbohydrates, increasing water intake, no skipping meals, meal planning and cooking strategies and planning for success.  Tonya Nicholson has agreed to follow-up with our clinic in 2 weeks fasting. She was informed of the importance of frequent follow-up visits to maximize her success with intensive lifestyle modifications for her multiple health conditions.   Objective:   Blood pressure 102/68, pulse (!) 54, temperature 97.6 F (36.4 C), temperature source Oral, height 5\' 7"  (1.702 m), weight 262 lb (118.8 kg), last menstrual period 04/09/2016, SpO2 100 %. Body mass index is 41.04 kg/m.  General: Cooperative, alert, well developed, in no acute distress. HEENT: Conjunctivae and lids unremarkable. Cardiovascular: Regular rhythm.  Lungs: Normal work of breathing. Neurologic: No focal deficits.   Lab Results  Component Value Date   CREATININE 0.80 10/03/2019   BUN 16 10/03/2019   NA 138 10/03/2019   K 4.5 10/03/2019   CL 103 10/03/2019   CO2 21 10/03/2019   Lab Results  Component Value Date   ALT 17 06/29/2019   AST 14 06/29/2019   ALKPHOS 75 06/29/2019   BILITOT 0.4 06/29/2019   Lab Results  Component Value Date   HGBA1C 5.7 (H) 06/29/2019   HGBA1C 5.7 (H) 01/31/2019   HGBA1C 6.2 (H) 02/24/2018   HGBA1C 6.4 (H) 05/08/2016   Lab Results  Component Value Date   INSULIN 14.5 06/29/2019   INSULIN 8.2 01/31/2019   INSULIN 10.7 02/24/2018   Lab Results  Component Value Date   TSH 1.410 02/24/2018   Lab Results  Component Value Date   CHOL 154 06/29/2019   HDL 59 06/29/2019   LDLCALC 83 06/29/2019   TRIG 57 06/29/2019   CHOLHDL 2.8 05/08/2016   Lab Results  Component Value Date   WBC 8.5 10/03/2019   HGB 14.5 10/03/2019   HCT 43.3 10/03/2019   MCV 89 10/03/2019   PLT 288 10/03/2019   Attestation Statements:    Reviewed by clinician on day of visit: allergies, medications, problem list, medical history, surgical history, family history, social history, and previous encounter notes.  Coral Ceo, am acting as Location manager for Mina Marble, NP.  I have reviewed the above documentation for accuracy and completeness, and I agree with the above. -  Mitchell Epling d. Tehani Mersman, NP-C

## 2020-02-08 ENCOUNTER — Other Ambulatory Visit (HOSPITAL_COMMUNITY): Payer: Self-pay | Admitting: Internal Medicine

## 2020-02-08 MED FILL — SHINGRIX 50 MCG SUS: 50 | 1 days supply | Qty: 1 | Fill #0

## 2020-02-15 ENCOUNTER — Other Ambulatory Visit: Payer: Self-pay

## 2020-02-15 ENCOUNTER — Encounter (INDEPENDENT_AMBULATORY_CARE_PROVIDER_SITE_OTHER): Payer: Self-pay | Admitting: Adult Health

## 2020-02-15 ENCOUNTER — Telehealth (INDEPENDENT_AMBULATORY_CARE_PROVIDER_SITE_OTHER): Payer: 59 | Admitting: Adult Health

## 2020-02-15 DIAGNOSIS — R7303 Prediabetes: Secondary | ICD-10-CM

## 2020-02-15 DIAGNOSIS — Z6841 Body Mass Index (BMI) 40.0 and over, adult: Secondary | ICD-10-CM | POA: Diagnosis not present

## 2020-02-15 DIAGNOSIS — I480 Paroxysmal atrial fibrillation: Secondary | ICD-10-CM | POA: Diagnosis not present

## 2020-02-19 NOTE — Progress Notes (Signed)
TeleHealth Visit:  Due to the COVID-19 pandemic, this visit was completed with telemedicine (audio/video) technology to reduce patient and provider exposure as well as to preserve personal protective equipment.   Tonya Nicholson has verbally consented to this TeleHealth visit. The patient is located at home, the provider is located at the [facility] office. The participants in this visit include the listed provider and patient. The visit was conducted today via video.   Chief Complaint: OBESITY Tonya Nicholson is here to discuss her progress with her obesity treatment plan along with follow-up of her obesity related diagnoses. Tonya Nicholson is on the Category 2 Plan and states she is following her eating plan approximately 80% of the time. Tonya Nicholson states she is exercising 0 minutes 0 times per week.  Today's visit was #: 28 Starting weight: 263 lbs Starting date: 02/24/2018  Interim History: Pt is pleased that she has been following the Category 2 meal plan at least 80% of the time, and she feels that she has at least maintained her weight. She feels that her clothes are fitting a bit looser.  Subjective:   1. PAF (paroxysmal atrial fibrillation) (HCC) Pt denies current palpitations. She denies excessive fatigue. Pt is on Lopressor 25 mg BID. Her heart rate typically runs mid 50 to low 60.  2. Pre-diabetes 06/29/2019 A1c was 5.7 and is steadily improving. She is not on Metformin, instead controlling with lifestyle modifications.  Lab Results  Component Value Date   HGBA1C 5.7 (H) 06/29/2019   Lab Results  Component Value Date   INSULIN 14.5 06/29/2019   INSULIN 8.2 01/31/2019   INSULIN 10.7 02/24/2018    Assessment/Plan:   1. PAF (paroxysmal atrial fibrillation) (HCC) Continue beta-blocker and follow up with cardiology as directed.  2. Pre-diabetes Tonya Nicholson will continue to work on weight loss, exercise, and decreasing simple carbohydrates to help decrease the risk of diabetes. Continue  Category 2 meal plan.  3. Class 3 severe obesity with serious comorbidity and body mass index (BMI) of 40.0 to 44.9 in adult, unspecified obesity type (HCC) Tonya Nicholson is currently in the action stage of change. As such, her goal is to continue with weight loss efforts. She has agreed to the Category 2 Plan.   Exercise goals: Overtime work  Scientist, water quality modification strategies: increasing lean protein intake, decreasing simple carbohydrates, meal planning and cooking strategies and planning for success.  Tonya Nicholson has agreed to follow-up with our clinic in 2 weeks. She was informed of the importance of frequent follow-up visits to maximize her success with intensive lifestyle modifications for her multiple health conditions.  Objective:   VITALS: Per patient if applicable, see vitals. GENERAL: Alert and in no acute distress. CARDIOPULMONARY: No increased WOB. Speaking in clear sentences.  PSYCH: Pleasant and cooperative. Speech normal rate and rhythm. Affect is appropriate. Insight and judgement are appropriate. Attention is focused, linear, and appropriate.  NEURO: Oriented as arrived to appointment on time with no prompting.   Lab Results  Component Value Date   CREATININE 0.80 10/03/2019   BUN 16 10/03/2019   NA 138 10/03/2019   K 4.5 10/03/2019   CL 103 10/03/2019   CO2 21 10/03/2019   Lab Results  Component Value Date   ALT 17 06/29/2019   AST 14 06/29/2019   ALKPHOS 75 06/29/2019   BILITOT 0.4 06/29/2019   Lab Results  Component Value Date   HGBA1C 5.7 (H) 06/29/2019   HGBA1C 5.7 (H) 01/31/2019   HGBA1C 6.2 (H) 02/24/2018  HGBA1C 6.4 (H) 05/08/2016   Lab Results  Component Value Date   INSULIN 14.5 06/29/2019   INSULIN 8.2 01/31/2019   INSULIN 10.7 02/24/2018   Lab Results  Component Value Date   TSH 1.410 02/24/2018   Lab Results  Component Value Date   CHOL 154 06/29/2019   HDL 59 06/29/2019   LDLCALC 83 06/29/2019   TRIG 57 06/29/2019   CHOLHDL 2.8 05/08/2016    Lab Results  Component Value Date   WBC 8.5 10/03/2019   HGB 14.5 10/03/2019   HCT 43.3 10/03/2019   MCV 89 10/03/2019   PLT 288 10/03/2019   No results found for: IRON, TIBC, FERRITIN  Attestation Statements:   Reviewed by clinician on day of visit: allergies, medications, problem list, medical history, surgical history, family history, social history, and previous encounter notes.  Time spent on visit including pre-visit chart review and post-visit charting and care was 31 minutes.   Coral Ceo, am acting as Location manager for Mina Marble, NP.  I have reviewed the above documentation for accuracy and completeness, and I agree with the above. - Karlye Ihrig d. Jayquon Theiler, NP-C

## 2020-02-21 ENCOUNTER — Other Ambulatory Visit: Payer: Self-pay | Admitting: Cardiology

## 2020-02-21 DIAGNOSIS — I10 Essential (primary) hypertension: Secondary | ICD-10-CM

## 2020-02-21 MED FILL — FLECAINIDE ACETATE 50 MG TA: 50 | 30 days supply | Qty: 60 | Fill #1

## 2020-02-22 ENCOUNTER — Other Ambulatory Visit (HOSPITAL_COMMUNITY): Payer: Self-pay | Admitting: Obstetrics & Gynecology

## 2020-02-22 MED FILL — AMLODIPINE-BENAZEPRIL 5-40: 5-40 | 30 days supply | Qty: 30 | Fill #0

## 2020-02-29 ENCOUNTER — Ambulatory Visit (INDEPENDENT_AMBULATORY_CARE_PROVIDER_SITE_OTHER): Payer: 59 | Admitting: Adult Health

## 2020-02-29 ENCOUNTER — Other Ambulatory Visit: Payer: Self-pay

## 2020-02-29 ENCOUNTER — Encounter (INDEPENDENT_AMBULATORY_CARE_PROVIDER_SITE_OTHER): Payer: Self-pay | Admitting: Adult Health

## 2020-02-29 VITALS — BP 108/68 | HR 63 | Temp 98.0°F | Ht 67.0 in | Wt 256.0 lb

## 2020-02-29 DIAGNOSIS — R7303 Prediabetes: Secondary | ICD-10-CM

## 2020-02-29 DIAGNOSIS — Z6841 Body Mass Index (BMI) 40.0 and over, adult: Secondary | ICD-10-CM

## 2020-02-29 DIAGNOSIS — E559 Vitamin D deficiency, unspecified: Secondary | ICD-10-CM | POA: Diagnosis not present

## 2020-03-04 NOTE — Progress Notes (Signed)
Chief Complaint:   OBESITY Tonya Nicholson is here to discuss her progress with her obesity treatment plan along with follow-up of her obesity related diagnoses. Tonya Nicholson is on the Category 2 Plan and states she is following her eating plan approximately 70% of the time. Tonya Nicholson states she is doing 0 minutes 0 times per week.  Today's visit was #: 10 Starting weight: 263 lbs Starting date: 02/24/2018 Today's weight: 256 lbs Today's date: 02/29/2020 Total lbs lost to date: 7 lbs Total lbs lost since last in-office visit: 6 lbs  Interim History: Tonya Nicholson reports it took 3 trips to the grocery store to find deli meat. She has really focused on limiting snacking and keeps to the 200 snack cal/day- great!  Interval goal: Go to the gym once a week.  Subjective:   1. Vitamin D deficiency Tonya Nicholson's Vitamin D level was 48.7 on 06/29/2019. She is currently taking prescription vitamin D 50,000 IU each week. She denies nausea, vomiting or muscle weakness.   Ref. Range 06/29/2019 14:38  Vitamin D, 25-Hydroxy Latest Ref Range: 30.0 - 100.0 ng/mL 48.7   2. Pre-diabetes Upon Epic review, pt has several elevated blood glucose and A1c readings noted. She is not on Metformin.  Lab Results  Component Value Date   HGBA1C 5.7 (H) 06/29/2019   Lab Results  Component Value Date   INSULIN 14.5 06/29/2019   INSULIN 8.2 01/31/2019   INSULIN 10.7 02/24/2018    Assessment/Plan:   1. Vitamin D deficiency Low Vitamin D level contributes to fatigue and are associated with obesity, breast, and colon cancer. She agrees to continue to take prescription Vitamin D @50 ,000 IU every week and will follow-up for routine testing of Vitamin D, at least 2-3 times per year to avoid over-replacement. Check labs at next OV.  2. Pre-diabetes Tonya Nicholson will continue to work on weight loss, exercise, and decreasing simple carbohydrates to help decrease the risk of diabetes.  Check labs at next OV.  3. Class 3 severe obesity with serious  comorbidity and body mass index (BMI) of 40.0 to 44.9 in adult, unspecified obesity type (HCC) Tonya Nicholson is currently in the action stage of change. As such, her goal is to continue with weight loss efforts. She has agreed to the Category 2 Plan.   Fasting labs at next OV.   Exercise goals: Go to the gym once a week  Behavioral modification strategies: increasing lean protein intake, meal planning and cooking strategies, keeping healthy foods in the home, better snacking choices and planning for success.  Tonya Nicholson has agreed to follow-up with our clinic in 2-3 weeks. She was informed of the importance of frequent follow-up visits to maximize her success with intensive lifestyle modifications for her multiple health conditions.   Objective:   Blood pressure 108/68, pulse 63, temperature 98 F (36.7 C), height 5\' 7"  (1.702 m), weight 256 lb (116.1 kg), last menstrual period 04/09/2016, SpO2 97 %. Body mass index is 40.1 kg/m.  General: Cooperative, alert, well developed, in no acute distress. HEENT: Conjunctivae and lids unremarkable. Cardiovascular: Regular rhythm.  Lungs: Normal work of breathing. Neurologic: No focal deficits.   Lab Results  Component Value Date   CREATININE 0.80 10/03/2019   BUN 16 10/03/2019   NA 138 10/03/2019   K 4.5 10/03/2019   CL 103 10/03/2019   CO2 21 10/03/2019   Lab Results  Component Value Date   ALT 17 06/29/2019   AST 14 06/29/2019   ALKPHOS 75 06/29/2019   BILITOT  0.4 06/29/2019   Lab Results  Component Value Date   HGBA1C 5.7 (H) 06/29/2019   HGBA1C 5.7 (H) 01/31/2019   HGBA1C 6.2 (H) 02/24/2018   HGBA1C 6.4 (H) 05/08/2016   Lab Results  Component Value Date   INSULIN 14.5 06/29/2019   INSULIN 8.2 01/31/2019   INSULIN 10.7 02/24/2018   Lab Results  Component Value Date   TSH 1.410 02/24/2018   Lab Results  Component Value Date   CHOL 154 06/29/2019   HDL 59 06/29/2019   LDLCALC 83 06/29/2019   TRIG 57 06/29/2019   CHOLHDL 2.8  05/08/2016   Lab Results  Component Value Date   WBC 8.5 10/03/2019   HGB 14.5 10/03/2019   HCT 43.3 10/03/2019   MCV 89 10/03/2019   PLT 288 10/03/2019    Attestation Statements:   Reviewed by clinician on day of visit: allergies, medications, problem list, medical history, surgical history, family history, social history, and previous encounter notes.  Time spent on visit including pre-visit chart review and post-visit care and charting was 31 minutes.   Coral Ceo, am acting as Location manager for Mina Marble, NP.  I have reviewed the above documentation for accuracy and completeness, and I agree with the above. -  Abbas Beyene d. Ronnald Shedden, NP-C

## 2020-03-19 ENCOUNTER — Ambulatory Visit (INDEPENDENT_AMBULATORY_CARE_PROVIDER_SITE_OTHER): Payer: 59 | Admitting: Adult Health

## 2020-03-20 MED FILL — AMLODIPINE-BENAZEPRIL 5-40: 5-40 | 30 days supply | Qty: 30 | Fill #1

## 2020-03-20 MED FILL — FLECAINIDE ACETATE 50 MG TA: 50 | 30 days supply | Qty: 60 | Fill #2

## 2020-03-20 MED FILL — METOPROLOL TARTRATE 25 MG T: 25 | 45 days supply | Qty: 90 | Fill #0

## 2020-03-20 MED FILL — ELIQUIS 5 MG TABLET: 5 | 90 days supply | Qty: 180 | Fill #1

## 2020-03-21 ENCOUNTER — Ambulatory Visit (INDEPENDENT_AMBULATORY_CARE_PROVIDER_SITE_OTHER): Payer: 59 | Admitting: Adult Health

## 2020-03-28 ENCOUNTER — Other Ambulatory Visit: Payer: Self-pay

## 2020-03-28 ENCOUNTER — Ambulatory Visit (INDEPENDENT_AMBULATORY_CARE_PROVIDER_SITE_OTHER): Payer: 59 | Admitting: Adult Health

## 2020-03-28 ENCOUNTER — Encounter (INDEPENDENT_AMBULATORY_CARE_PROVIDER_SITE_OTHER): Payer: Self-pay | Admitting: Adult Health

## 2020-03-28 ENCOUNTER — Other Ambulatory Visit (INDEPENDENT_AMBULATORY_CARE_PROVIDER_SITE_OTHER): Payer: Self-pay | Admitting: Adult Health

## 2020-03-28 VITALS — BP 101/66 | HR 77 | Temp 97.9°F | Ht 67.0 in | Wt 260.0 lb

## 2020-03-28 DIAGNOSIS — E559 Vitamin D deficiency, unspecified: Secondary | ICD-10-CM

## 2020-03-28 DIAGNOSIS — R7303 Prediabetes: Secondary | ICD-10-CM | POA: Diagnosis not present

## 2020-03-28 DIAGNOSIS — I480 Paroxysmal atrial fibrillation: Secondary | ICD-10-CM | POA: Diagnosis not present

## 2020-03-28 DIAGNOSIS — Z9189 Other specified personal risk factors, not elsewhere classified: Secondary | ICD-10-CM | POA: Diagnosis not present

## 2020-03-28 DIAGNOSIS — Z6841 Body Mass Index (BMI) 40.0 and over, adult: Secondary | ICD-10-CM | POA: Diagnosis not present

## 2020-03-28 MED ORDER — VITAMIN D (ERGOCALCIFEROL) 1.25 MG (50000 UNIT) PO CAPS: 50000.0000 [IU] | ORAL_CAPSULE | ORAL | 0 refills | Status: DC

## 2020-03-28 MED FILL — VIT D2 1.25 MG (50,000 UNIT: 1.25 MG | 28 days supply | Qty: 4 | Fill #0

## 2020-04-01 NOTE — Progress Notes (Signed)
Chief Complaint:   OBESITY Tonya Nicholson is here to discuss her progress with her obesity treatment plan along with follow-up of her obesity related diagnoses. Tonya Nicholson is on the Category 2 Plan and states she is following her eating plan approximately 75% of the time. Tonya Nicholson states she is doing 0 minutes 0 times per week.  Today's visit was #: 80 Starting weight: 263 lbs Starting date: 02/24/2018 Today's weight: 260 lbs Today's date: 03/28/2020 Total lbs lost to date: 3 lbs Total lbs lost since last in-office visit: 0  Interim History: Tonya Nicholson traveled to [place] to celebrate her birthday with her mother and daughter. She enjoyed eating out and a few cocktails. She is ready to resume eating on plan and come in for fasting labs at next OV.  Subjective:   1. Vitamin D deficiency Tonya Nicholson's Vitamin D level was 48.7 on 06/29/2019. She is currently taking prescription vitamin D 50,000 IU each week. She denies nausea, vomiting or muscle weakness.   Ref. Range 06/29/2019 14:38  Vitamin D, 25-Hydroxy Latest Ref Range: 30.0 - 100.0 ng/mL 48.7   2. PAF (paroxysmal atrial fibrillation) (HCC) Tonya Nicholson denies any recent palpitations. She is on Eliquis 5 mg BID, flecainide 50 mg BID, metoprolol tartrate 25 mg BID, and amlodipine-benazepril 5-40 mg daily.  3. Pre-diabetes Tonya Nicholson's A1c was 5.7 on 06/29/2019.  Lab Results  Component Value Date   HGBA1C 5.7 (H) 06/29/2019   Lab Results  Component Value Date   INSULIN 14.5 06/29/2019   INSULIN 8.2 01/31/2019   INSULIN 10.7 02/24/2018    4. At risk for diabetes mellitus Tonya Nicholson is at higher than average risk for developing diabetes due to obesity and pre-diabetes.  Assessment/Plan:   1. Vitamin D deficiency Low Vitamin D level contributes to fatigue and are associated with obesity, breast, and colon cancer. She agrees to continue to take prescription Vitamin D @50 ,000 IU every week and will follow-up for routine testing of Vitamin D, at least 2-3 times per  year to avoid over-replacement. Check labs at next OV.  - Vitamin D, Ergocalciferol, (DRISDOL) 1.25 MG (50000 UNIT) CAPS capsule; Take 1 capsule (50,000 Units total) by mouth every 7 (seven) days.  Dispense: 4 capsule; Refill: 0  2. PAF (paroxysmal atrial fibrillation) (HCC) Continue current medication regimen.  3. Pre-diabetes Tonya Nicholson will continue to work on weight loss, exercise, and decreasing simple carbohydrates to help decrease the risk of diabetes. Check labs at next OV.  4. At risk for diabetes mellitus Tonya Nicholson was given approximately 15 minutes of diabetes education and counseling today. We discussed intensive lifestyle modifications today with an emphasis on weight loss as well as increasing exercise and decreasing simple carbohydrates in her diet. We also reviewed medication options with an emphasis on risk versus benefit of those discussed.   Repetitive spaced learning was employed today to elicit superior memory formation and behavioral change.  5. Class 3 severe obesity with serious comorbidity and body mass index (BMI) of 40.0 to 44.9 in adult, unspecified obesity type (HCC) Tonya Nicholson is currently in the action stage of change. As such, her goal is to continue with weight loss efforts. She has agreed to the Category 2 Plan.   Fasting labs at next OV.  Exercise goals: No exercise has been prescribed at this time.  Behavioral modification strategies: increasing lean protein intake, meal planning and cooking strategies and planning for success.  Tonya Nicholson has agreed to follow-up with our clinic in 3 weeks. She was informed of the importance  of frequent follow-up visits to maximize her success with intensive lifestyle modifications for her multiple health conditions.   Objective:   Blood pressure 101/66, pulse 77, temperature 97.9 F (36.6 C), height 5\' 7"  (1.702 m), weight 260 lb (117.9 kg), last menstrual period 04/09/2016, SpO2 98 %. Body mass index is 40.72 kg/m.  General:  Cooperative, alert, well developed, in no acute distress. HEENT: Conjunctivae and lids unremarkable. Cardiovascular: Regular rhythm.  Lungs: Normal work of breathing. Neurologic: No focal deficits.   Lab Results  Component Value Date   CREATININE 0.80 10/03/2019   BUN 16 10/03/2019   NA 138 10/03/2019   K 4.5 10/03/2019   CL 103 10/03/2019   CO2 21 10/03/2019   Lab Results  Component Value Date   ALT 17 06/29/2019   AST 14 06/29/2019   ALKPHOS 75 06/29/2019   BILITOT 0.4 06/29/2019   Lab Results  Component Value Date   HGBA1C 5.7 (H) 06/29/2019   HGBA1C 5.7 (H) 01/31/2019   HGBA1C 6.2 (H) 02/24/2018   HGBA1C 6.4 (H) 05/08/2016   Lab Results  Component Value Date   INSULIN 14.5 06/29/2019   INSULIN 8.2 01/31/2019   INSULIN 10.7 02/24/2018   Lab Results  Component Value Date   TSH 1.410 02/24/2018   Lab Results  Component Value Date   CHOL 154 06/29/2019   HDL 59 06/29/2019   LDLCALC 83 06/29/2019   TRIG 57 06/29/2019   CHOLHDL 2.8 05/08/2016   Lab Results  Component Value Date   WBC 8.5 10/03/2019   HGB 14.5 10/03/2019   HCT 43.3 10/03/2019   MCV 89 10/03/2019   PLT 288 10/03/2019    Attestation Statements:   Reviewed by clinician on day of visit: allergies, medications, problem list, medical history, surgical history, family history, social history, and previous encounter notes.  Coral Ceo, am acting as Location manager for Mina Marble, NP.  I have reviewed the above documentation for accuracy and completeness, and I agree with the above. -  Tonya Nicholson d. Lyrica Mcclarty, NP-C

## 2020-04-05 ENCOUNTER — Other Ambulatory Visit (HOSPITAL_COMMUNITY): Payer: Self-pay | Admitting: Dermatology

## 2020-04-05 DIAGNOSIS — I872 Venous insufficiency (chronic) (peripheral): Secondary | ICD-10-CM | POA: Diagnosis not present

## 2020-04-05 MED FILL — BETAMETHASONE DP AUG 0.05%: 0.05 | 25 days supply | Qty: 50 | Fill #0

## 2020-04-16 MED FILL — SHINGRIX 50 MCG SUS: 50 | 1 days supply | Qty: 1 | Fill #1

## 2020-04-18 ENCOUNTER — Ambulatory Visit (INDEPENDENT_AMBULATORY_CARE_PROVIDER_SITE_OTHER): Payer: 59 | Admitting: Adult Health

## 2020-04-22 MED FILL — FLECAINIDE ACETATE 50 MG TA: 50 | 30 days supply | Qty: 60 | Fill #3

## 2020-04-22 MED FILL — AMLODIPINE-BENAZEPRIL 5-40: 5-40 | 30 days supply | Qty: 30 | Fill #2

## 2020-04-25 ENCOUNTER — Ambulatory Visit (INDEPENDENT_AMBULATORY_CARE_PROVIDER_SITE_OTHER): Payer: 59 | Admitting: Adult Health

## 2020-04-25 ENCOUNTER — Encounter (INDEPENDENT_AMBULATORY_CARE_PROVIDER_SITE_OTHER): Payer: Self-pay | Admitting: Adult Health

## 2020-04-25 ENCOUNTER — Other Ambulatory Visit: Payer: Self-pay

## 2020-04-25 VITALS — BP 101/66 | HR 65 | Temp 98.3°F | Ht 67.0 in | Wt 258.0 lb

## 2020-04-25 DIAGNOSIS — I10 Essential (primary) hypertension: Secondary | ICD-10-CM

## 2020-04-25 DIAGNOSIS — R7303 Prediabetes: Secondary | ICD-10-CM | POA: Diagnosis not present

## 2020-04-25 DIAGNOSIS — Z9189 Other specified personal risk factors, not elsewhere classified: Secondary | ICD-10-CM

## 2020-04-25 DIAGNOSIS — I480 Paroxysmal atrial fibrillation: Secondary | ICD-10-CM

## 2020-04-25 DIAGNOSIS — E559 Vitamin D deficiency, unspecified: Secondary | ICD-10-CM

## 2020-04-25 DIAGNOSIS — Z6841 Body Mass Index (BMI) 40.0 and over, adult: Secondary | ICD-10-CM

## 2020-04-26 DIAGNOSIS — M1711 Unilateral primary osteoarthritis, right knee: Secondary | ICD-10-CM | POA: Diagnosis not present

## 2020-04-26 LAB — COMPREHENSIVE METABOLIC PANEL
ALT: 18 IU/L (ref 0–32)
AST: 14 IU/L (ref 0–40)
Albumin/Globulin Ratio: 1.5 (ref 1.2–2.2)
Albumin: 4.1 g/dL (ref 3.8–4.9)
Alkaline Phosphatase: 82 IU/L (ref 44–121)
BUN/Creatinine Ratio: 14 (ref 12–28)
BUN: 13 mg/dL (ref 8–27)
Bilirubin Total: 0.6 mg/dL (ref 0.0–1.2)
CO2: 24 mmol/L (ref 20–29)
Calcium: 9.3 mg/dL (ref 8.7–10.3)
Chloride: 103 mmol/L (ref 96–106)
Creatinine, Ser: 0.9 mg/dL (ref 0.57–1.00)
Globulin, Total: 2.8 g/dL (ref 1.5–4.5)
Glucose: 111 mg/dL — ABNORMAL HIGH (ref 65–99)
Potassium: 4.6 mmol/L (ref 3.5–5.2)
Sodium: 141 mmol/L (ref 134–144)
Total Protein: 6.9 g/dL (ref 6.0–8.5)
eGFR: 73 mL/min/{1.73_m2} (ref >59)

## 2020-04-26 LAB — CBC
Hematocrit: 42.3 % (ref 34.0–46.6)
Hemoglobin: 14.2 g/dL (ref 11.1–15.9)
MCH: 30 pg (ref 26.6–33.0)
MCHC: 33.6 g/dL (ref 31.5–35.7)
MCV: 89 fL (ref 79–97)
Platelets: 264 10*3/uL (ref 150–450)
RBC: 4.74 x10E6/uL (ref 3.77–5.28)
RDW: 12 % (ref 11.7–15.4)
WBC: 5.3 10*3/uL (ref 3.4–10.8)

## 2020-04-26 LAB — LIPID PANEL
Chol/HDL Ratio: 3.1 ratio (ref 0.0–4.4)
Cholesterol, Total: 166 mg/dL (ref 100–199)
HDL: 54 mg/dL (ref >39)
LDL Chol Calc (NIH): 98 mg/dL (ref 0–99)
Triglycerides: 71 mg/dL (ref 0–149)
VLDL Cholesterol Cal: 14 mg/dL (ref 5–40)

## 2020-04-26 LAB — HEMOGLOBIN A1C
Est. average glucose Bld gHb Est-mCnc: 126 mg/dL
Hgb A1c MFr Bld: 6 % — ABNORMAL HIGH (ref 4.8–5.6)

## 2020-04-26 LAB — VITAMIN D 25 HYDROXY (VIT D DEFICIENCY, FRACTURES): Vit D, 25-Hydroxy: 34.1 ng/mL (ref 30.0–100.0)

## 2020-04-26 LAB — INSULIN, RANDOM: INSULIN: 9.4 u[IU]/mL (ref 2.6–24.9)

## 2020-04-29 NOTE — Progress Notes (Signed)
Chief Complaint:   OBESITY Tonya Nicholson is here to discuss her progress with her obesity treatment plan along with follow-up of her obesity related diagnoses. Tonya Nicholson is on the Category 2 Plan and states she is following her eating plan approximately 50% of the time. Tonya Nicholson states she is doing 0 minutes 0 times per week.  Today's visit was #: 77 Starting weight: 263 lbs Starting date: 02/24/2018 Today's weight: 258 lbs Today's date: 04/25/2020 Total lbs lost to date: 5 lbs Total lbs lost since last in-office visit: 2 lbs  Interim History: Tonya Nicholson feels that meal planning/prepping has been going well. Due to her hectic work schedule, she will often be unable to consume all the food she has packed for the day (lunch and snacks).  Subjective:   1. Essential hypertension Tonya Nicholson's BP is stable at OV.  BP Readings from Last 3 Encounters:  04/25/20 101/66  03/28/20 101/66  02/29/20 108/68    2. Vitamin D deficiency Tonya Nicholson's Vitamin D level was 48.7 on 06/29/2019. She is currently taking prescription vitamin D 50,000 IU each week. She denies nausea, vomiting or muscle weakness.   Ref. Range 06/29/2019 14:38  Vitamin D, 25-Hydroxy Latest Ref Range: 30.0 - 100.0 ng/mL 48.7   3. Pre-diabetes Tonya Nicholson's A1c has been 5.7 at the last 2 checks. She is not on Metformin.  4. PAF (paroxysmal atrial fibrillation) (HCC) Tonya Nicholson is well controlled on Eliquis, Tambocor, and Lopressor.  5. At risk for diabetes mellitus Tonya Nicholson is at higher than average risk for developing diabetes due to obesity and pre-diabetes.  Assessment/Plan:   1. Essential hypertension Tonya Nicholson is working on healthy weight loss and exercise to improve blood pressure control. We will watch for signs of hypotension as she continues her lifestyle modifications. Check labs today.  - Comprehensive metabolic panel - Lipid panel  2. Vitamin D deficiency Low Vitamin D level contributes to fatigue and are associated with obesity, breast, and colon  cancer. She agrees to continue to take prescription Vitamin D @50 ,000 IU every week and will follow-up for routine testing of Vitamin D, at least 2-3 times per year to avoid over-replacement. Check labs today.  - VITAMIN D 25 Hydroxy (Vit-D Deficiency, Fractures)  3. Pre-diabetes Tonya Nicholson will continue to work on weight loss, exercise, and decreasing simple carbohydrates to help decrease the risk of diabetes. Check labs today.  - Hemoglobin A1c - Insulin, random  4. PAF (paroxysmal atrial fibrillation) (Tonya Nicholson) Check labs today.  - CBC  5. At risk for diabetes mellitus Tonya Nicholson was given approximately 15 minutes of diabetes education and counseling today. We discussed intensive lifestyle modifications today with an emphasis on weight loss as well as increasing exercise and decreasing simple carbohydrates in her diet. We also reviewed medication options with an emphasis on risk versus benefit of those discussed.   Repetitive spaced learning was employed today to elicit superior memory formation and behavioral change.  6. Class 3 severe obesity with serious comorbidity and body mass index (BMI) of 40.0 to 44.9 in adult, unspecified obesity type (HCC) Tonya Nicholson is currently in the action stage of change. As such, her goal is to continue with weight loss efforts. She has agreed to the Category 2 Plan.   Change timing of meals to help with daily consumption of all foods.  Exercise goals: As is  Behavioral modification strategies: increasing lean protein intake, meal planning and cooking strategies and planning for success.  Tonya Nicholson has agreed to follow-up with our clinic in 2 weeks. She  was informed of the importance of frequent follow-up visits to maximize her success with intensive lifestyle modifications for her multiple health conditions.   Tonya Nicholson was informed we would discuss her lab results at her next visit unless there is a critical issue that needs to be addressed sooner. Tonya Nicholson agreed to keep her  next visit at the agreed upon time to discuss these results.  Objective:   Blood pressure 101/66, pulse 65, temperature 98.3 F (36.8 C), height 5\' 7"  (1.702 m), weight 258 lb (117 kg), last menstrual period 04/09/2016, SpO2 97 %. Body mass index is 40.41 kg/m.  General: Cooperative, alert, well developed, in no acute distress. HEENT: Conjunctivae and lids unremarkable. Cardiovascular: Regular rhythm.  Lungs: Normal work of breathing. Neurologic: No focal deficits.   Lab Results  Component Value Date   CREATININE 0.90 04/25/2020   BUN 13 04/25/2020   NA 141 04/25/2020   K 4.6 04/25/2020   CL 103 04/25/2020   CO2 24 04/25/2020   Lab Results  Component Value Date   ALT 18 04/25/2020   AST 14 04/25/2020   ALKPHOS 82 04/25/2020   BILITOT 0.6 04/25/2020   Lab Results  Component Value Date   HGBA1C 6.0 (H) 04/25/2020   HGBA1C 5.7 (H) 06/29/2019   HGBA1C 5.7 (H) 01/31/2019   HGBA1C 6.2 (H) 02/24/2018   HGBA1C 6.4 (H) 05/08/2016   Lab Results  Component Value Date   INSULIN 9.4 04/25/2020   INSULIN 14.5 06/29/2019   INSULIN 8.2 01/31/2019   INSULIN 10.7 02/24/2018   Lab Results  Component Value Date   TSH 1.410 02/24/2018   Lab Results  Component Value Date   CHOL 166 04/25/2020   HDL 54 04/25/2020   LDLCALC 98 04/25/2020   TRIG 71 04/25/2020   CHOLHDL 3.1 04/25/2020   Lab Results  Component Value Date   WBC 5.3 04/25/2020   HGB 14.2 04/25/2020   HCT 42.3 04/25/2020   MCV 89 04/25/2020   PLT 264 04/25/2020     Attestation Statements:   Reviewed by clinician on day of visit: allergies, medications, problem list, medical history, surgical history, family history, social history, and previous encounter notes.  Coral Ceo, am acting as Location manager for Mina Marble, NP.  I have reviewed the above documentation for accuracy and completeness, and I agree with the above. -  Shalea Tomczak d. Zania Kalisz, NP-C

## 2020-05-08 ENCOUNTER — Other Ambulatory Visit: Payer: Self-pay

## 2020-05-08 ENCOUNTER — Encounter (INDEPENDENT_AMBULATORY_CARE_PROVIDER_SITE_OTHER): Payer: Self-pay | Admitting: Adult Health

## 2020-05-08 ENCOUNTER — Ambulatory Visit (INDEPENDENT_AMBULATORY_CARE_PROVIDER_SITE_OTHER): Payer: 59 | Admitting: Adult Health

## 2020-05-08 VITALS — BP 95/61 | HR 52 | Temp 98.4°F | Ht 67.0 in | Wt 258.0 lb

## 2020-05-08 DIAGNOSIS — Z6841 Body Mass Index (BMI) 40.0 and over, adult: Secondary | ICD-10-CM | POA: Diagnosis not present

## 2020-05-08 DIAGNOSIS — R7303 Prediabetes: Secondary | ICD-10-CM | POA: Diagnosis not present

## 2020-05-08 DIAGNOSIS — E559 Vitamin D deficiency, unspecified: Secondary | ICD-10-CM

## 2020-05-09 NOTE — Progress Notes (Signed)
Chief Complaint:   OBESITY Tonya Nicholson is here to discuss her progress with her obesity treatment plan along with follow-up of her obesity related diagnoses. Tonya Nicholson is on the Category 2 Plan and states she is following her eating plan approximately 75% of the time. Tonya Nicholson states she is going to the gym 30 minutes 2 times per week.  Today's visit was #: 62 Starting weight: 263 lbs Starting date: 02/24/2018 Today's weight: 258 lbs Today's date: 05/08/2020 Total lbs lost to date: 5 Total lbs lost since last in-office visit: 0  Interim History: Tonya Nicholson has resumed regular exercise- gym at least 2 times a week. She will often meet her daughter at the gym-great! She has been consistently following category 2 more than 75% of the time.  Subjective:   1. Pre-diabetes Worsening. Discussed labs with patient today. Tonya Nicholson's 04/25/2020 A1c increased to 6.0 with elevated BG 111 and insulin level 9.4. 04/25/2020 CMP GFR 73. We discussed the risk/benefits of Metformin and GLP-1 therapy. Niyah wants to think about it.  Lab Results  Component Value Date   HGBA1C 6.0 (H) 04/25/2020   Lab Results  Component Value Date   INSULIN 9.4 04/25/2020   INSULIN 14.5 06/29/2019   INSULIN 8.2 01/31/2019   INSULIN 10.7 02/24/2018    2. Vitamin D deficiency Tonya Nicholson's Vitamin D level was 34.1 on 04/25/2020, which is below goal of 50.Marland Kitchen She is currently taking prescription vitamin D 50,000 IU each week. She denies nausea, vomiting or muscle weakness.  Assessment/Plan:   1. Pre-diabetes Tonya Nicholson will continue to work on weight loss, exercise, and decreasing simple carbohydrates to help decrease the risk of diabetes. Continue category 2 and regular exercise.  2. Vitamin D deficiency Low Vitamin D level contributes to fatigue and are associated with obesity, breast, and colon cancer. She agrees to continue to take prescription Vitamin D @50 ,000 IU every week and will follow-up for routine testing of Vitamin D, at least 2-3  times per year to avoid over-replacement. No refill needed at this time.  3. Class 3 severe obesity with serious comorbidity and body mass index (BMI) of 40.0 to 44.9 in adult, unspecified obesity type (HCC) Tonya Nicholson is currently in the action stage of change. As such, her goal is to continue with weight loss efforts. She has agreed to the Category 2 Plan.   Exercise goals: As is  Behavioral modification strategies: increasing lean protein intake, decreasing simple carbohydrates, increasing water intake, meal planning and cooking strategies, keeping healthy foods in the home and planning for success.  Tonya Nicholson has agreed to follow-up with our clinic in 3 weeks. She was informed of the importance of frequent follow-up visits to maximize her success with intensive lifestyle modifications for her multiple health conditions.   Objective:   Blood pressure 95/61, pulse (!) 52, temperature 98.4 F (36.9 C), height 5\' 7"  (1.702 m), weight 258 lb (117 kg), last menstrual period 04/09/2016, SpO2 96 %. Body mass index is 40.41 kg/m.  General: Cooperative, alert, well developed, in no acute distress. HEENT: Conjunctivae and lids unremarkable. Cardiovascular: Regular rhythm.  Lungs: Normal work of breathing. Neurologic: No focal deficits.   Lab Results  Component Value Date   CREATININE 0.90 04/25/2020   BUN 13 04/25/2020   NA 141 04/25/2020   K 4.6 04/25/2020   CL 103 04/25/2020   CO2 24 04/25/2020   Lab Results  Component Value Date   ALT 18 04/25/2020   AST 14 04/25/2020   ALKPHOS 82 04/25/2020  BILITOT 0.6 04/25/2020   Lab Results  Component Value Date   HGBA1C 6.0 (H) 04/25/2020   HGBA1C 5.7 (H) 06/29/2019   HGBA1C 5.7 (H) 01/31/2019   HGBA1C 6.2 (H) 02/24/2018   HGBA1C 6.4 (H) 05/08/2016   Lab Results  Component Value Date   INSULIN 9.4 04/25/2020   INSULIN 14.5 06/29/2019   INSULIN 8.2 01/31/2019   INSULIN 10.7 02/24/2018   Lab Results  Component Value Date   TSH 1.410  02/24/2018   Lab Results  Component Value Date   CHOL 166 04/25/2020   HDL 54 04/25/2020   LDLCALC 98 04/25/2020   TRIG 71 04/25/2020   CHOLHDL 3.1 04/25/2020   Lab Results  Component Value Date   WBC 5.3 04/25/2020   HGB 14.2 04/25/2020   HCT 42.3 04/25/2020   MCV 89 04/25/2020   PLT 264 04/25/2020    Attestation Statements:   Reviewed by clinician on day of visit: allergies, medications, problem list, medical history, surgical history, family history, social history, and previous encounter notes.  Time spent on visit including pre-visit chart review and post-visit care and charting was 32 minutes.   Coral Ceo, am acting as Location manager for Tonya Marble, NP.  I have reviewed the above documentation for accuracy and completeness, and I agree with the above. -  Tonya Nicholson d. Monifa Blanchette, NP-C

## 2020-05-17 ENCOUNTER — Other Ambulatory Visit (HOSPITAL_COMMUNITY): Payer: Self-pay

## 2020-05-17 DIAGNOSIS — I872 Venous insufficiency (chronic) (peripheral): Secondary | ICD-10-CM | POA: Diagnosis not present

## 2020-05-17 MED FILL — Amlodipine Besylate-Benazepril HCl Cap 5-40 MG: ORAL | 30 days supply | Qty: 30 | Fill #0 | Status: AC

## 2020-05-17 MED FILL — Flecainide Acetate Tab 50 MG: ORAL | 30 days supply | Qty: 60 | Fill #0 | Status: AC

## 2020-05-20 ENCOUNTER — Other Ambulatory Visit (HOSPITAL_COMMUNITY): Payer: Self-pay

## 2020-05-20 ENCOUNTER — Other Ambulatory Visit (INDEPENDENT_AMBULATORY_CARE_PROVIDER_SITE_OTHER): Payer: Self-pay | Admitting: Cardiology

## 2020-05-20 DIAGNOSIS — I10 Essential (primary) hypertension: Secondary | ICD-10-CM

## 2020-05-20 MED ORDER — METOPROLOL TARTRATE 25 MG PO TABS
25.0000 mg | ORAL_TABLET | Freq: Two times a day (BID) | ORAL | 3 refills | Status: DC
  Filled 2020-05-20: qty 180, 90d supply, fill #0
  Filled 2020-08-20: qty 180, 90d supply, fill #1
  Filled 2020-09-20 – 2020-11-15 (×2): qty 180, 90d supply, fill #2
  Filled 2021-02-04: qty 180, 90d supply, fill #3

## 2020-05-24 ENCOUNTER — Other Ambulatory Visit (HOSPITAL_COMMUNITY): Payer: Self-pay

## 2020-05-30 ENCOUNTER — Ambulatory Visit (INDEPENDENT_AMBULATORY_CARE_PROVIDER_SITE_OTHER): Payer: 59 | Admitting: Adult Health

## 2020-06-13 ENCOUNTER — Ambulatory Visit (INDEPENDENT_AMBULATORY_CARE_PROVIDER_SITE_OTHER): Payer: 59 | Admitting: Adult Health

## 2020-06-13 ENCOUNTER — Encounter (INDEPENDENT_AMBULATORY_CARE_PROVIDER_SITE_OTHER): Payer: Self-pay | Admitting: Adult Health

## 2020-06-13 ENCOUNTER — Other Ambulatory Visit (HOSPITAL_COMMUNITY): Payer: Self-pay

## 2020-06-13 ENCOUNTER — Other Ambulatory Visit: Payer: Self-pay

## 2020-06-13 VITALS — BP 111/70 | HR 51 | Temp 97.9°F | Ht 67.0 in | Wt 259.0 lb

## 2020-06-13 DIAGNOSIS — E8881 Metabolic syndrome: Secondary | ICD-10-CM | POA: Diagnosis not present

## 2020-06-13 DIAGNOSIS — Z1231 Encounter for screening mammogram for malignant neoplasm of breast: Secondary | ICD-10-CM | POA: Diagnosis not present

## 2020-06-13 DIAGNOSIS — Z01419 Encounter for gynecological examination (general) (routine) without abnormal findings: Secondary | ICD-10-CM | POA: Diagnosis not present

## 2020-06-13 DIAGNOSIS — Z6841 Body Mass Index (BMI) 40.0 and over, adult: Secondary | ICD-10-CM | POA: Diagnosis not present

## 2020-06-13 DIAGNOSIS — E559 Vitamin D deficiency, unspecified: Secondary | ICD-10-CM

## 2020-06-13 DIAGNOSIS — R7303 Prediabetes: Secondary | ICD-10-CM

## 2020-06-13 MED ORDER — AMLODIPINE BESY-BENAZEPRIL HCL 5-40 MG PO CAPS
1.0000 | ORAL_CAPSULE | Freq: Every day | ORAL | 2 refills | Status: DC
  Filled 2020-06-13 – 2020-06-25 (×2): qty 90, 90d supply, fill #0
  Filled 2020-09-20: qty 90, 90d supply, fill #1
  Filled 2020-12-25: qty 90, 90d supply, fill #2

## 2020-06-17 NOTE — Progress Notes (Signed)
Chief Complaint:   OBESITY Tonya Nicholson is here to discuss her progress with her obesity treatment plan along with follow-up of her obesity related diagnoses. Tonya Nicholson is on the Category 2 Plan and states she is following her eating plan approximately 50% of the time. Tonya Nicholson states she is doing yard work.  Today's visit was #: 56 Starting weight: 263 lbs Starting date: 02/24/2018 Today's weight: 259 lbs Today's date: 06/13/2020 Total lbs lost to date: 4 Total lbs lost since last in-office visit: 0  Interim History: Due to a hectic schedule, Tonya Nicholson will consistently consume breakfast and often skip lunch or dinner. She will meal plan/prep but will not take prepared foods with her to work.  Subjective:   1. Vitamin D deficiency Tonya Nicholson's Vitamin D level was 34.1 on 04/25/2020- below goal of 50. She is currently taking prescription vitamin D 50,000 IU each week. She denies nausea, vomiting or muscle weakness.  2. Pre-diabetes 04/25/2020- A1c 6.0 with elevated BG 111 and insulin level 9.4. Discussed risks/benefits of Metformin or GLP-1 therapy. Tonya Nicholson wants to focus on lifestyle modifications- declined Rx at this time.  Lab Results  Component Value Date   HGBA1C 6.0 (H) 04/25/2020   Lab Results  Component Value Date   INSULIN 9.4 04/25/2020   INSULIN 14.5 06/29/2019   INSULIN 8.2 01/31/2019   INSULIN 10.7 02/24/2018    3. Metabolic syndrome Hypertension, elevated blood glucose, visceral adiposity.  Assessment/Plan:   1. Vitamin D deficiency Low Vitamin D level contributes to fatigue and are associated with obesity, breast, and colon cancer. She agrees to continue to take prescription Vitamin D @50 ,000 IU every week and will follow-up for routine testing of Vitamin D, at least 2-3 times per year to avoid over-replacement.  2. Pre-diabetes Tonya Nicholson will continue to work on weight loss, exercise, and decreasing simple carbohydrates to help decrease the risk of diabetes.  -Continue category  2 and increase daily exercise/activity.  3. Metabolic syndrome Increase protein and regular exercise.  4. Class 3 severe obesity with serious comorbidity and body mass index (BMI) of 40.0 to 44.9 in adult, unspecified obesity type (HCC) Tonya Nicholson is currently in the action stage of change. As such, her goal is to continue with weight loss efforts. She has agreed to the Category 2 Plan and keeping a food journal and adhering to recommended goals of 300-400 calories and 30 grams protein with lunch and 400-500 calories and 35 grams protein with dinner.   Continue to meal plan/prep. -take food in cooler bag when leaving every morning headed to work or run errands.  Exercise goals: As is  Behavioral modification strategies: increasing lean protein intake, decreasing simple carbohydrates, increasing water intake, decreasing eating out, no skipping meals, meal planning and cooking strategies and planning for success.  Tonya Nicholson has agreed to follow-up with our clinic in 6 weeks. She was informed of the importance of frequent follow-up visits to maximize her success with intensive lifestyle modifications for her multiple health conditions.   Objective:   Blood pressure 111/70, pulse (!) 51, temperature 97.9 F (36.6 C), height 5\' 7"  (1.702 m), weight 259 lb (117.5 kg), last menstrual period 04/09/2016, SpO2 96 %. Body mass index is 40.57 kg/m.  General: Cooperative, alert, well developed, in no acute distress. HEENT: Conjunctivae and lids unremarkable. Cardiovascular: Regular rhythm.  Lungs: Normal work of breathing. Neurologic: No focal deficits.   Lab Results  Component Value Date   CREATININE 0.90 04/25/2020   BUN 13 04/25/2020   NA  141 04/25/2020   K 4.6 04/25/2020   CL 103 04/25/2020   CO2 24 04/25/2020   Lab Results  Component Value Date   ALT 18 04/25/2020   AST 14 04/25/2020   ALKPHOS 82 04/25/2020   BILITOT 0.6 04/25/2020   Lab Results  Component Value Date   HGBA1C 6.0 (H)  04/25/2020   HGBA1C 5.7 (H) 06/29/2019   HGBA1C 5.7 (H) 01/31/2019   HGBA1C 6.2 (H) 02/24/2018   HGBA1C 6.4 (H) 05/08/2016   Lab Results  Component Value Date   INSULIN 9.4 04/25/2020   INSULIN 14.5 06/29/2019   INSULIN 8.2 01/31/2019   INSULIN 10.7 02/24/2018   Lab Results  Component Value Date   TSH 1.410 02/24/2018   Lab Results  Component Value Date   CHOL 166 04/25/2020   HDL 54 04/25/2020   LDLCALC 98 04/25/2020   TRIG 71 04/25/2020   CHOLHDL 3.1 04/25/2020   Lab Results  Component Value Date   WBC 5.3 04/25/2020   HGB 14.2 04/25/2020   HCT 42.3 04/25/2020   MCV 89 04/25/2020   PLT 264 04/25/2020   No results found for: IRON, TIBC, FERRITIN   Attestation Statements:   Reviewed by clinician on day of visit: allergies, medications, problem list, medical history, surgical history, family history, social history, and previous encounter notes.  Time spent on visit including pre-visit chart review and post-visit care and charting was 32 minutes.   Coral Ceo, CMA, am acting as transcriptionist for Mina Marble, NP.  I have reviewed the above documentation for accuracy and completeness, and I agree with the above. -  Rosemary Mossbarger d. Mima Cranmore, NP-C

## 2020-06-21 ENCOUNTER — Other Ambulatory Visit (HOSPITAL_COMMUNITY): Payer: Self-pay

## 2020-06-25 ENCOUNTER — Other Ambulatory Visit (HOSPITAL_COMMUNITY): Payer: Self-pay

## 2020-06-25 ENCOUNTER — Other Ambulatory Visit (INDEPENDENT_AMBULATORY_CARE_PROVIDER_SITE_OTHER): Payer: Self-pay | Admitting: Adult Health

## 2020-06-25 ENCOUNTER — Other Ambulatory Visit: Payer: Self-pay | Admitting: Cardiology

## 2020-06-25 DIAGNOSIS — E559 Vitamin D deficiency, unspecified: Secondary | ICD-10-CM

## 2020-06-25 DIAGNOSIS — R87810 Cervical high risk human papillomavirus (HPV) DNA test positive: Secondary | ICD-10-CM | POA: Diagnosis not present

## 2020-06-25 MED ORDER — ELIQUIS 5 MG PO TABS
5.0000 mg | ORAL_TABLET | Freq: Two times a day (BID) | ORAL | 1 refills | Status: DC
  Filled 2020-06-25: qty 180, 90d supply, fill #0
  Filled 2020-09-20: qty 180, 90d supply, fill #1

## 2020-06-25 MED ORDER — VITAMIN D (ERGOCALCIFEROL) 1.25 MG (50000 UNIT) PO CAPS
50000.0000 [IU] | ORAL_CAPSULE | ORAL | 0 refills | Status: DC
  Filled 2020-06-25: qty 4, 28d supply, fill #0

## 2020-06-25 MED FILL — Flecainide Acetate Tab 50 MG: ORAL | 90 days supply | Qty: 180 | Fill #1 | Status: AC

## 2020-06-25 NOTE — Telephone Encounter (Signed)
Refill request; confirmed with patient

## 2020-06-25 NOTE — Telephone Encounter (Signed)
Pt's age 60, wt 117.5 kg, SCr 0.9, CrCl 123.3, last ov w/ WC 01/11/20.

## 2020-07-05 ENCOUNTER — Other Ambulatory Visit (HOSPITAL_COMMUNITY): Payer: Self-pay

## 2020-07-05 ENCOUNTER — Encounter: Payer: Self-pay | Admitting: Cardiology

## 2020-07-05 ENCOUNTER — Ambulatory Visit (INDEPENDENT_AMBULATORY_CARE_PROVIDER_SITE_OTHER): Payer: 59 | Admitting: Cardiology

## 2020-07-05 ENCOUNTER — Other Ambulatory Visit: Payer: Self-pay

## 2020-07-05 VITALS — BP 116/80 | HR 50 | Ht 67.0 in | Wt 263.2 lb

## 2020-07-05 DIAGNOSIS — I4819 Other persistent atrial fibrillation: Secondary | ICD-10-CM | POA: Diagnosis not present

## 2020-07-05 NOTE — Progress Notes (Signed)
Electrophysiology Office Note   Date:  07/05/2020   ID:  Tonya Nicholson, DOB September 10, 1960, MRN [id number]  PCP:  Tonya Hedges, DO  Cardiologist:   Primary Electrophysiologist:  Tonya Ditton Meredith Leeds, MD    No chief complaint on file.    History of Present Illness: Tonya Nicholson is a 60 y.o. female who is being seen today for the evaluation of atrial fibrillation at the request of Nicholson, [name], DO. Presenting today for electrophysiology evaluation.    She has a history of severe for morbid obesity and hypertension.  She presented her primary physician's office and was found to be in atrial fibrillation.  Her symptoms were fatigue and shortness of breath.  She had a cardioversion but unfortunately back into atrial fibrillation.  She has since started flecainide.  Today, denies symptoms of palpitations, chest pain, shortness of breath, orthopnea, PND, lower extremity edema, claudication, dizziness, presyncope, syncope, bleeding, or neurologic sequela. The patient is tolerating medications without difficulties.     Past Medical History:  Diagnosis Date   Dyspnea    GERD (gastroesophageal reflux disease)    Hypertension    Joint pain    Knee pain    Lower extremity edema    Past Surgical History:  Procedure Laterality Date   CARDIOVERSION N/A 03/21/2018   Procedure: CARDIOVERSION;  Surgeon: Tonya Margarita, MD;  Location: [facility] ENDOSCOPY;  Service: Cardiovascular;  Laterality: N/A;   CARDIOVERSION N/A 03/31/2018   Procedure: CARDIOVERSION;  Surgeon: Tonya Haw, MD;  Location: [facility];  Service: Cardiovascular;  Laterality: N/A;   CARDIOVERSION N/A 10/06/2019   Procedure: CARDIOVERSION;  Surgeon: Tonya Haw, MD;  Location: [facility] ENDOSCOPY;  Service: Cardiovascular;  Laterality: N/A;     Current Outpatient Medications  Medication Sig Dispense Refill   amLODipine-benazepril (LOTREL) 5-40 MG capsule Take 1 capsule by mouth daily. 90 capsule 2    amLODipine-benazepril (LOTREL) 5-40 MG per capsule TAKE 1 CAPSULE BY MOUTH DAILY. 90 capsule 0   apixaban (ELIQUIS) 5 MG TABS tablet Take 1 tablet (5 mg total) by mouth 2 (two) times daily. 180 tablet 1   augmented betamethasone dipropionate (DIPROLENE-AF) 0.05 % cream APPLY TOPICALLY TO AFFECTED AREA TWICE DAILY AS NEEDED. NOT TO FACE, GROIN, AND UNDERARMS. 60 g 3   flecainide (TAMBOCOR) 50 MG tablet TAKE 1 TABLET (50 MG TOTAL) BY MOUTH 2 (TWO) TIMES DAILY. 60 tablet 11   metoprolol tartrate (LOPRESSOR) 25 MG tablet TAKE 1 TABLET (25 MG TOTAL) BY MOUTH 2 (TWO) TIMES DAILY. 180 tablet 3   Vitamin D, Ergocalciferol, (DRISDOL) 1.25 MG (50000 UNIT) CAPS capsule Take 1 capsule (50,000 Units total) by mouth every 7 (seven) days. 4 capsule 0   Zoster Vaccine Adjuvanted (SHINGRIX) injection INJECT 0.5 ML INTO THE MUSCLE ONCE FOR 1ST DOSE. REPEAT IN 2 TO 6 MONTHS FOR 2ND DOSE. 1 each 1   No current facility-administered medications for this visit.    Allergies:   Patient has no known allergies.   Social History:  The patient  reports that she has never smoked. She has never used smokeless tobacco. She reports current alcohol use of about 9.0 standard drinks of alcohol per week. She reports that she does not use drugs.   Family History:  The patient's family history includes Atrial fibrillation in her mother; Bladder Cancer in her father; Diabetes in her brother, father, and mother; Healthy in her sister; Heart attack in her brother; Heart disease in her father; Hypertension in her father and mother.  ROS:  Please see the history of present illness.   Otherwise, review of systems is positive for none.   All other systems are reviewed and negative.   PHYSICAL EXAM: VS:  BP 116/80   Pulse (!) 50   Ht 5\' 7"  (1.702 m)   Wt 263 lb 3.2 oz (119.4 kg)   LMP 04/09/2016 (Approximate)   SpO2 96%   BMI 41.22 kg/m  , BMI Body mass index is 41.22 kg/m. GEN: Well nourished, well developed, in no acute distress   HEENT: normal  Neck: no JVD, carotid bruits, or masses Cardiac: RRR; no murmurs, rubs, or gallops,no edema  Respiratory:  clear to auscultation bilaterally, normal work of breathing GI: soft, nontender, nondistended, + BS MS: no deformity or atrophy  Skin: warm and dry Neuro:  Strength and sensation are intact Psych: euthymic mood, full affect  EKG:  EKG is ordered today. Personal review of the ekg ordered shows sinus rhythm  Recent Labs: 04/25/2020: ALT 18; BUN 13; Creatinine, Ser 0.90; Hemoglobin 14.2; Platelets 264; Potassium 4.6; Sodium 141    Lipid Panel     Component Value Date/Time   CHOL 166 04/25/2020 1001   TRIG 71 04/25/2020 1001   HDL 54 04/25/2020 1001   CHOLHDL 3.1 04/25/2020 1001   LDLCALC 98 04/25/2020 1001     Wt Readings from Last 3 Encounters:  07/05/20 263 lb 3.2 oz (119.4 kg)  06/13/20 259 lb (117.5 kg)  05/08/20 258 lb (117 kg)      Other studies Reviewed: Additional studies/ records that were reviewed today include: TTE 03/04/2018  1. The left ventricle has normal systolic function of 08-67%. The cavity  size was normal. There is no increased left ventricular wall thickness.  Left ventricular diastology could not be evaluated secondary to atrial  fibrillation.   2. The right ventricle has normal systolic function. The cavity was  normal. There there are no ECGs that show atrial fibrillation no increase in right ventricular wall thickness.   3. Left atrial size was mildly dilated.   4. Normal RVSP   5. The inferior vena cava was dilated in size with <50% respiratory  variability.   ASSESSMENT AND PLAN:  1.  Persistent atrial fibrillation: Currently on metoprolol, Eliquis, flecainide.  High risk medication monitoring.  CHA2DS2-VASc of 2.  She is remained in sinus.  2.  Hypertension: Currently well controlled  3.  Snoring, daytime somnolence: We Renardo Nicholson like to hold off on sleep study at this  Current medicines are reviewed at length with the  patient today.   The patient does not have concerns regarding her medicines.  The following changes were made today: none  Labs/ tests ordered today include:  Orders Placed This Encounter  Procedures   EKG 12-Lead     Disposition:   FU with Lawerance Matsuo 84months  Signed, Arnetha Silverthorne Meredith Leeds, MD  07/05/2020 12:33 PM     [place] [address] [place]  [address] [phone] (office) [phone] (fax)

## 2020-07-11 ENCOUNTER — Ambulatory Visit: Payer: 59 | Admitting: Cardiology

## 2020-07-25 ENCOUNTER — Ambulatory Visit (INDEPENDENT_AMBULATORY_CARE_PROVIDER_SITE_OTHER): Payer: 59 | Admitting: Adult Health

## 2020-08-20 ENCOUNTER — Other Ambulatory Visit (HOSPITAL_COMMUNITY): Payer: Self-pay

## 2020-08-22 ENCOUNTER — Ambulatory Visit (INDEPENDENT_AMBULATORY_CARE_PROVIDER_SITE_OTHER): Payer: 59 | Admitting: Adult Health

## 2020-09-05 ENCOUNTER — Ambulatory Visit (INDEPENDENT_AMBULATORY_CARE_PROVIDER_SITE_OTHER): Payer: 59 | Admitting: Adult Health

## 2020-09-20 ENCOUNTER — Other Ambulatory Visit (HOSPITAL_COMMUNITY): Payer: Self-pay

## 2020-09-20 MED FILL — Flecainide Acetate Tab 50 MG: ORAL | 90 days supply | Qty: 180 | Fill #2 | Status: AC

## 2020-09-25 ENCOUNTER — Other Ambulatory Visit (HOSPITAL_COMMUNITY): Payer: Self-pay

## 2020-09-26 ENCOUNTER — Encounter (INDEPENDENT_AMBULATORY_CARE_PROVIDER_SITE_OTHER): Payer: Self-pay | Admitting: Adult Health

## 2020-09-26 ENCOUNTER — Ambulatory Visit (INDEPENDENT_AMBULATORY_CARE_PROVIDER_SITE_OTHER): Payer: 59 | Admitting: Adult Health

## 2020-09-26 ENCOUNTER — Other Ambulatory Visit (HOSPITAL_COMMUNITY): Payer: Self-pay

## 2020-09-26 ENCOUNTER — Other Ambulatory Visit: Payer: Self-pay

## 2020-09-26 VITALS — BP 93/62 | HR 57 | Temp 97.8°F | Ht 67.0 in | Wt 257.0 lb

## 2020-09-26 DIAGNOSIS — I10 Essential (primary) hypertension: Secondary | ICD-10-CM | POA: Diagnosis not present

## 2020-09-26 DIAGNOSIS — R7303 Prediabetes: Secondary | ICD-10-CM

## 2020-09-26 DIAGNOSIS — Z9189 Other specified personal risk factors, not elsewhere classified: Secondary | ICD-10-CM | POA: Diagnosis not present

## 2020-09-26 DIAGNOSIS — E559 Vitamin D deficiency, unspecified: Secondary | ICD-10-CM | POA: Diagnosis not present

## 2020-09-26 DIAGNOSIS — Z6841 Body Mass Index (BMI) 40.0 and over, adult: Secondary | ICD-10-CM | POA: Diagnosis not present

## 2020-09-26 MED ORDER — VITAMIN D (ERGOCALCIFEROL) 1.25 MG (50000 UNIT) PO CAPS
50000.0000 [IU] | ORAL_CAPSULE | ORAL | 0 refills | Status: DC
  Filled 2020-09-26: qty 4, 28d supply, fill #0

## 2020-09-26 NOTE — Progress Notes (Signed)
Chief Complaint:   OBESITY Tonya Nicholson is here to discuss her progress with her obesity treatment plan along with follow-up of her obesity related diagnoses. Tonya Nicholson is on the Category 2 Plan and keeping a food journal and adhering to recommended goals of 300-400 calories and 30 grams of protein at lunch and 400-500 calories and 35 grams of protein at dinner and states she is following her eating plan approximately 0% of the time. Tonya Nicholson states she is going to the gym (just started back) for 30 minutes 3 times per week.  Today's visit was #: 13 Starting weight: 263 lbs Starting date: 02/24/2018 Today's weight: 257 lbs Today's date: 09/26/2020 Total lbs lost to date: 6 lbs Total lbs lost since last in-office visit: 2 lbs  Interim History: Tonya Nicholson has not been following the Category 2 meal plan or exercising the last several weeks.   She has re-focused her health and fitness efforts and is ready to resume eating on the Category plan and exercising at local gym.  Subjective:   1. Vitamin D deficiency On 04/25/2020, vitamin D level was 34.1 - below goal of 50.  She is currently taking prescription ergocalciferol 50,000 IU each week. She denies nausea, vomiting or muscle weakness.  Lab Results  Component Value Date   VD25OH 34.1 04/25/2020   VD25OH 48.7 06/29/2019   VD25OH 38.1 01/31/2019   2. Prediabetes She has never been on any antidiabetic medications. Family history of diabetes - mother and brother.  Lab Results  Component Value Date   HGBA1C 6.0 (H) 04/25/2020   Lab Results  Component Value Date   INSULIN 9.4 04/25/2020   INSULIN 14.5 06/29/2019   INSULIN 8.2 01/31/2019   INSULIN 10.7 02/24/2018   3. Essential hypertension BP soft - but this appears her baseline.  She denies dizziness with position changes. She denies extreme fatigue.  BP Readings from Last 3 Encounters:  09/26/20 93/62  07/05/20 116/80  06/13/20 111/70   4. At risk for osteoporosis Tonya Nicholson is at higher  risk of osteopenia and osteoporosis due to Vitamin D deficiency and obesity.    Assessment/Plan:   1. Vitamin D deficiency Low Vitamin D level contributes to fatigue and are associated with obesity, breast, and colon cancer. She agrees to continue to take prescription ergocalciferol '@50'$ ,000 IU every week.  Check labs today.  - Refill Vitamin D, Ergocalciferol, (DRISDOL) 1.25 MG (50000 UNIT) CAPS capsule; Take 1 capsule (50,000 Units total) by mouth every 7 (seven) days.  Dispense: 4 capsule; Refill: 0 - VITAMIN D 25 Hydroxy (Vit-D Deficiency, Fractures)  2. Prediabetes Check labs.  - Hemoglobin A1c - Insulin, random  3. Essential hypertension Check labs today.  - Comprehensive metabolic panel  4. At risk for osteoporosis Tonya Nicholson was given approximately 15 minutes of osteoporosis prevention counseling today. Tonya Nicholson is at risk for osteopenia and osteoporosis due to her Vitamin D deficiency. She was encouraged to take her Vitamin D and follow her higher calcium diet and increase strengthening exercise to help strengthen her bones and decrease her risk of osteopenia and osteoporosis.  Repetitive spaced learning was employed today to elicit superior memory formation and behavioral change.   5. Class 3 severe obesity with serious comorbidity and body mass index (BMI) of 40.0 to 44.9 in adult, unspecified obesity type (HCC)  Tonya Nicholson is currently in the action stage of change. As such, her goal is to continue with weight loss efforts. She has agreed to the Category 2 Plan.   Exercise  goals:  As is.  Behavioral modification strategies: increasing lean protein intake, decreasing simple carbohydrates, meal planning and cooking strategies, keeping healthy foods in the home, and planning for success.  Tonya Nicholson has agreed to follow-up with our clinic in 2 weeks. She was informed of the importance of frequent follow-up visits to maximize her success with intensive lifestyle modifications for her multiple  health conditions.   Objective:   Blood pressure 93/62, pulse (!) 57, temperature 97.8 F (36.6 C), height '5\' 7"'$  (1.702 m), weight 257 lb (116.6 kg), last menstrual period 04/09/2016, SpO2 97 %. Body mass index is 40.25 kg/m.  General: Cooperative, alert, well developed, in no acute distress. HEENT: Conjunctivae and lids unremarkable. Cardiovascular: Regular rhythm.  Lungs: Normal work of breathing. Neurologic: No focal deficits.   Lab Results  Component Value Date   CREATININE 0.90 04/25/2020   BUN 13 04/25/2020   NA 141 04/25/2020   K 4.6 04/25/2020   CL 103 04/25/2020   CO2 24 04/25/2020   Lab Results  Component Value Date   ALT 18 04/25/2020   AST 14 04/25/2020   ALKPHOS 82 04/25/2020   BILITOT 0.6 04/25/2020   Lab Results  Component Value Date   HGBA1C 6.0 (H) 04/25/2020   HGBA1C 5.7 (H) 06/29/2019   HGBA1C 5.7 (H) 01/31/2019   HGBA1C 6.2 (H) 02/24/2018   HGBA1C 6.4 (H) 05/08/2016   Lab Results  Component Value Date   INSULIN 9.4 04/25/2020   INSULIN 14.5 06/29/2019   INSULIN 8.2 01/31/2019   INSULIN 10.7 02/24/2018   Lab Results  Component Value Date   TSH 1.410 02/24/2018   Lab Results  Component Value Date   CHOL 166 04/25/2020   HDL 54 04/25/2020   LDLCALC 98 04/25/2020   TRIG 71 04/25/2020   CHOLHDL 3.1 04/25/2020   Lab Results  Component Value Date   VD25OH 34.1 04/25/2020   VD25OH 48.7 06/29/2019   VD25OH 38.1 01/31/2019   Lab Results  Component Value Date   WBC 5.3 04/25/2020   HGB 14.2 04/25/2020   HCT 42.3 04/25/2020   MCV 89 04/25/2020   PLT 264 04/25/2020   Attestation Statements:   Reviewed by clinician on day of visit: allergies, medications, problem list, medical history, surgical history, family history, social history, and previous encounter notes.  I, Water quality scientist, CMA, am acting as Location manager for Mina Marble, NP.  I have reviewed the above documentation for accuracy and completeness, and I agree with the  above. -  Golda Zavalza d. Olyn Landstrom, NP-C

## 2020-09-27 LAB — COMPREHENSIVE METABOLIC PANEL
ALT: 19 IU/L (ref 0–32)
AST: 17 IU/L (ref 0–40)
Albumin/Globulin Ratio: 1.5 (ref 1.2–2.2)
Albumin: 4 g/dL (ref 3.8–4.9)
Alkaline Phosphatase: 75 IU/L (ref 44–121)
BUN/Creatinine Ratio: 15 (ref 12–28)
BUN: 13 mg/dL (ref 8–27)
Bilirubin Total: 0.5 mg/dL (ref 0.0–1.2)
CO2: 25 mmol/L (ref 20–29)
Calcium: 9.1 mg/dL (ref 8.7–10.3)
Chloride: 103 mmol/L (ref 96–106)
Creatinine, Ser: 0.84 mg/dL (ref 0.57–1.00)
Globulin, Total: 2.6 g/dL (ref 1.5–4.5)
Glucose: 136 mg/dL — ABNORMAL HIGH (ref 65–99)
Potassium: 4.6 mmol/L (ref 3.5–5.2)
Sodium: 140 mmol/L (ref 134–144)
Total Protein: 6.6 g/dL (ref 6.0–8.5)
eGFR: 80 mL/min/{1.73_m2} (ref >59)

## 2020-09-27 LAB — HEMOGLOBIN A1C
Est. average glucose Bld gHb Est-mCnc: 126 mg/dL
Hgb A1c MFr Bld: 6 % — ABNORMAL HIGH (ref 4.8–5.6)

## 2020-09-27 LAB — VITAMIN D 25 HYDROXY (VIT D DEFICIENCY, FRACTURES): Vit D, 25-Hydroxy: 37.6 ng/mL (ref 30.0–100.0)

## 2020-09-27 LAB — INSULIN, RANDOM: INSULIN: 11 u[IU]/mL (ref 2.6–24.9)

## 2020-10-04 ENCOUNTER — Other Ambulatory Visit (HOSPITAL_COMMUNITY): Payer: Self-pay

## 2020-10-11 ENCOUNTER — Ambulatory Visit (INDEPENDENT_AMBULATORY_CARE_PROVIDER_SITE_OTHER): Payer: 59 | Admitting: Adult Health

## 2020-10-16 ENCOUNTER — Ambulatory Visit (INDEPENDENT_AMBULATORY_CARE_PROVIDER_SITE_OTHER): Payer: 59 | Admitting: Adult Health

## 2020-10-16 ENCOUNTER — Other Ambulatory Visit (HOSPITAL_COMMUNITY): Payer: Self-pay

## 2020-10-16 ENCOUNTER — Encounter (INDEPENDENT_AMBULATORY_CARE_PROVIDER_SITE_OTHER): Payer: Self-pay | Admitting: Adult Health

## 2020-10-16 ENCOUNTER — Other Ambulatory Visit: Payer: Self-pay

## 2020-10-16 VITALS — BP 104/56 | HR 50 | Temp 98.3°F | Ht 67.0 in | Wt 255.0 lb

## 2020-10-16 DIAGNOSIS — E559 Vitamin D deficiency, unspecified: Secondary | ICD-10-CM | POA: Diagnosis not present

## 2020-10-16 DIAGNOSIS — R7303 Prediabetes: Secondary | ICD-10-CM

## 2020-10-16 DIAGNOSIS — Z6841 Body Mass Index (BMI) 40.0 and over, adult: Secondary | ICD-10-CM | POA: Diagnosis not present

## 2020-10-16 DIAGNOSIS — Z9189 Other specified personal risk factors, not elsewhere classified: Secondary | ICD-10-CM | POA: Diagnosis not present

## 2020-10-16 MED ORDER — VITAMIN D (ERGOCALCIFEROL) 1.25 MG (50000 UNIT) PO CAPS
50000.0000 [IU] | ORAL_CAPSULE | ORAL | 0 refills | Status: DC
  Filled 2020-10-16: qty 4, 28d supply, fill #0

## 2020-10-16 MED ORDER — OZEMPIC (0.25 OR 0.5 MG/DOSE) 2 MG/1.5ML ~~LOC~~ SOPN
0.2500 mg | PEN_INJECTOR | SUBCUTANEOUS | 0 refills | Status: DC
  Filled 2020-10-16: qty 1.5, 56d supply, fill #0

## 2020-10-16 NOTE — Progress Notes (Signed)
Chief Complaint:   OBESITY Tonya Nicholson is here to discuss her progress with her obesity treatment plan along with follow-up of her obesity related diagnoses. Tonya Nicholson is on the Category 2 Plan and states she is following her eating plan approximately 75% of the time. Tonya Nicholson states she is going to the gym and walking for 30 minutes 2-3 times per week.  Today's visit was #: 34 Starting weight: 263 lbs Starting date: 02/24/2018 Today's weight: 255 lbs Today's date: 10/16/2020 Total lbs lost to date: 8 lbs Total lbs lost since last in-office visit: 2 lbs  Interim History: Tonya Nicholson and family are traveling to [facility] for 12 days - they all need this well deserved vacation. They have found a local [employer] [place] to continue regular exercise - fantastic!  Subjective:   1. Vitamin D deficiency Vitamin D level on 09/26/2020 - 37.6 - below goal of 50. She is currently taking prescription ergocalciferol 50,000 IU each week. She denies nausea, vomiting or muscle weakness.  Lab Results  Component Value Date   VD25OH 37.6 09/26/2020   VD25OH 34.1 04/25/2020   VD25OH 48.7 06/29/2019   2. Prediabetes She denies family history of MTC or personal history of pancreatitis. A1c remains elevated despite months of proper diet and regular exercise. Her parents and brother have T2D.  Lab Results  Component Value Date   HGBA1C 6.0 (H) 09/26/2020   Lab Results  Component Value Date   INSULIN 11.0 09/26/2020   INSULIN 9.4 04/25/2020   INSULIN 14.5 06/29/2019   INSULIN 8.2 01/31/2019   INSULIN 10.7 02/24/2018   3. At risk for nausea Tonya Nicholson is at risk for nausea due to starting GLP.  Assessment/Plan:   1. Vitamin D deficiency Discussed labs with patient today.  Refill ergocalciferol 50,000 IU once weekly.  - Refill Vitamin D, Ergocalciferol, (DRISDOL) 1.25 MG (50000 UNIT) CAPS capsule; Take 1 capsule (50,000 Units total) by mouth every 7 (seven) days.  Dispense: 4 capsule; Refill:  0  2. Prediabetes Discussed labs with patient today.  Start Ozempic 0.25 mg once weekly, as per below.  - Start Semaglutide,0.25 or 0.5MG /DOS, (OZEMPIC, 0.25 OR 0.5 MG/DOSE,) 2 MG/1.5ML SOPN; Inject 0.25 mg into the skin once a week.  Dispense: 1.5 mL; Refill: 0  3. At risk for nausea Tonya Nicholson was given approximately 15 minutes of nausea prevention counseling today. Tonya Nicholson is at risk for nausea due to her new or current medication. She was encouraged to titrate her medication slowly, make sure to stay hydrated, eat smaller portions throughout the day, and avoid high fat meals.    4. Obesity with current BMI 40.0  Tonya Nicholson is currently in the action stage of change. As such, her goal is to continue with weight loss efforts. She has agreed to the Category 2 Plan.   Exercise goals:  As is.  Behavioral modification strategies: increasing lean protein intake, decreasing simple carbohydrates, meal planning and cooking strategies, keeping healthy foods in the home, travel eating strategies, and planning for success.  Tonya Nicholson has agreed to follow-up with our clinic in 2-3 weeks. She was informed of the importance of frequent follow-up visits to maximize her success with intensive lifestyle modifications for her multiple health conditions.   Objective:   Blood pressure (!) 104/56, pulse (!) 50, temperature 98.3 F (36.8 C), height 5\' 7"  (1.702 m), weight 255 lb (115.7 kg), last menstrual period 04/09/2016, SpO2 98 %. Body mass index is 39.94 kg/m.  General: Cooperative,  alert, well developed, in no acute distress. HEENT: Conjunctivae and lids unremarkable. Cardiovascular: Regular rhythm.  Lungs: Normal work of breathing. Neurologic: No focal deficits.   Lab Results  Component Value Date   CREATININE 0.84 09/26/2020   BUN 13 09/26/2020   NA 140 09/26/2020   K 4.6 09/26/2020   CL 103 09/26/2020   CO2 25 09/26/2020   Lab Results  Component Value Date   ALT 19 09/26/2020   AST 17  09/26/2020   ALKPHOS 75 09/26/2020   BILITOT 0.5 09/26/2020   Lab Results  Component Value Date   HGBA1C 6.0 (H) 09/26/2020   HGBA1C 6.0 (H) 04/25/2020   HGBA1C 5.7 (H) 06/29/2019   HGBA1C 5.7 (H) 01/31/2019   HGBA1C 6.2 (H) 02/24/2018   Lab Results  Component Value Date   INSULIN 11.0 09/26/2020   INSULIN 9.4 04/25/2020   INSULIN 14.5 06/29/2019   INSULIN 8.2 01/31/2019   INSULIN 10.7 02/24/2018   Lab Results  Component Value Date   TSH 1.410 02/24/2018   Lab Results  Component Value Date   CHOL 166 04/25/2020   HDL 54 04/25/2020   LDLCALC 98 04/25/2020   TRIG 71 04/25/2020   CHOLHDL 3.1 04/25/2020   Lab Results  Component Value Date   VD25OH 37.6 09/26/2020   VD25OH 34.1 04/25/2020   VD25OH 48.7 06/29/2019   Lab Results  Component Value Date   WBC 5.3 04/25/2020   HGB 14.2 04/25/2020   HCT 42.3 04/25/2020   MCV 89 04/25/2020   PLT 264 04/25/2020   Attestation Statements:   Reviewed by clinician on day of visit: allergies, medications, problem list, medical history, surgical history, family history, social history, and previous encounter notes.  I, Water quality scientist, CMA, am acting as Location manager for Mina Marble, NP.  I have reviewed the above documentation for accuracy and completeness, and I agree with the above. -  Tonya Nicholson d. Tonya Tiano, NP-C

## 2020-10-17 ENCOUNTER — Other Ambulatory Visit (HOSPITAL_COMMUNITY): Payer: Self-pay

## 2020-10-30 ENCOUNTER — Other Ambulatory Visit: Payer: Self-pay

## 2020-10-30 ENCOUNTER — Other Ambulatory Visit: Payer: Self-pay | Admitting: Cardiology

## 2020-10-30 ENCOUNTER — Other Ambulatory Visit (HOSPITAL_COMMUNITY): Payer: Self-pay

## 2020-10-30 ENCOUNTER — Ambulatory Visit (INDEPENDENT_AMBULATORY_CARE_PROVIDER_SITE_OTHER): Payer: 59 | Admitting: Adult Health

## 2020-10-30 ENCOUNTER — Encounter (INDEPENDENT_AMBULATORY_CARE_PROVIDER_SITE_OTHER): Payer: Self-pay | Admitting: Adult Health

## 2020-10-30 VITALS — BP 110/67 | HR 55 | Temp 98.2°F | Ht 67.0 in | Wt 259.0 lb

## 2020-10-30 DIAGNOSIS — Z6841 Body Mass Index (BMI) 40.0 and over, adult: Secondary | ICD-10-CM | POA: Diagnosis not present

## 2020-10-30 DIAGNOSIS — E559 Vitamin D deficiency, unspecified: Secondary | ICD-10-CM

## 2020-10-30 DIAGNOSIS — Z9189 Other specified personal risk factors, not elsewhere classified: Secondary | ICD-10-CM | POA: Diagnosis not present

## 2020-10-30 DIAGNOSIS — R7303 Prediabetes: Secondary | ICD-10-CM

## 2020-10-30 MED ORDER — VITAMIN D (ERGOCALCIFEROL) 1.25 MG (50000 UNIT) PO CAPS
50000.0000 [IU] | ORAL_CAPSULE | ORAL | 0 refills | Status: DC
  Filled 2020-10-30: qty 4, 28d supply, fill #0

## 2020-10-30 MED ORDER — OZEMPIC (0.25 OR 0.5 MG/DOSE) 2 MG/1.5ML ~~LOC~~ SOPN
0.2500 mg | PEN_INJECTOR | SUBCUTANEOUS | 0 refills | Status: DC
  Filled 2020-10-30: qty 1.5, 56d supply, fill #0

## 2020-10-30 MED ORDER — OZEMPIC (0.25 OR 0.5 MG/DOSE) 2 MG/1.5ML ~~LOC~~ SOPN
0.2500 mg | PEN_INJECTOR | SUBCUTANEOUS | 0 refills | Status: DC
  Filled 2020-10-30: qty 1.5, 56d supply, fill #0
  Filled 2020-10-30: qty 4, 147d supply, fill #0

## 2020-10-31 ENCOUNTER — Other Ambulatory Visit (HOSPITAL_COMMUNITY): Payer: Self-pay

## 2020-10-31 MED ORDER — APIXABAN 5 MG PO TABS
5.0000 mg | ORAL_TABLET | Freq: Two times a day (BID) | ORAL | 1 refills | Status: DC
  Filled 2020-10-31 – 2020-12-25 (×2): qty 180, 90d supply, fill #0
  Filled 2021-03-21: qty 180, 90d supply, fill #1

## 2020-10-31 NOTE — Progress Notes (Signed)
Chief Complaint:   OBESITY Tonya Nicholson is here to discuss her progress with her obesity treatment plan along with follow-up of her obesity related diagnoses. Tonya Nicholson is on the Category 2 Plan and states she is following her eating plan approximately 50% of the time. Tonya Nicholson states she walked on vacation.  Today's visit was #: 59 Starting weight: 263 lbs Starting date: 02/24/2018 Today's weight: 259 lbs Today's date: 10/30/2020 Total lbs lost to date: 4 lbs Total lbs lost since last in-office visit: 0  Interim History: Tonya Nicholson has yet to start Ozempic 0.25 mg - went on a 2 week vacation to [facility] with her family. She has a few days off until, she resumes work- ready to start GLP-1 therapy.  Subjective:   1. Prediabetes 09/26/20- A1c continues to remain in the pre-diabetic range, with worsening IR. She denies family hx of MTC or personal hx of pancreatitis. She has yet to begin weekly Ozempic 0.25mg   2. Vitamin D deficiency 09/26/20- Vit D Level 37.6- below goal of 50. She is on Ergocalciferol- denies N/V/Muscle Weakness  3. At risk for nausea Tonya Nicholson is at risk for nausea due to starting GLP-1 for prediabetes.  Assessment/Plan:   1. Prediabetes Start Ozempic 0.25 mg once weekly, as per below.  - Start Semaglutide,0.25 or 0.5MG /DOS, (OZEMPIC, 0.25 OR 0.5 MG/DOSE,) 2 MG/1.5ML SOPN; Inject 0.25 mg into the skin once a week.  Dispense: 4.5 mL; Refill: 0  2. Vitamin D deficiency Refill ergocalciferol 50,000 IU once weekly.  - Refill Vitamin D, Ergocalciferol, (DRISDOL) 1.25 MG (50000 UNIT) CAPS capsule; Take 1 capsule (50,000 Units total) by mouth every 7 (seven) days.  Dispense: 4 capsule; Refill: 0  3. At risk for nausea Tonya Nicholson was given approximately 15 minutes of nausea prevention counseling today. Tonya Nicholson is at risk for nausea due to her new or current medication. She was encouraged to titrate her medication slowly, make sure to stay hydrated, eat smaller portions  throughout the day, and avoid high fat meals.   4. Obesity with current BMI 40.7  Tonya Nicholson is currently in the action stage of change. As such, her goal is to continue with weight loss efforts. She has agreed to the Category 2 Plan.   Exercise goals:  As is.  Behavioral modification strategies: increasing lean protein intake, decreasing simple carbohydrates, meal planning and cooking strategies, keeping healthy foods in the home, and planning for success.  Tonya Nicholson has agreed to follow-up with our clinic in 4 weeks. She was informed of the importance of frequent follow-up visits to maximize her success with intensive lifestyle modifications for her multiple health conditions.   Objective:   Blood pressure 110/67, pulse (!) 55, temperature 98.2 F (36.8 C), height 5\' 7"  (1.702 m), weight 259 lb (117.5 kg), last menstrual period 04/09/2016, SpO2 97 %. Body mass index is 40.57 kg/m.  General: Cooperative, alert, well developed, in no acute distress. HEENT: Conjunctivae and lids unremarkable. Cardiovascular: Regular rhythm.  Lungs: Normal work of breathing. Neurologic: No focal deficits.   Lab Results  Component Value Date   CREATININE 0.84 09/26/2020   BUN 13 09/26/2020   NA 140 09/26/2020   K 4.6 09/26/2020   CL 103 09/26/2020   CO2 25 09/26/2020   Lab Results  Component Value Date   ALT 19 09/26/2020   AST 17 09/26/2020   ALKPHOS 75 09/26/2020   BILITOT 0.5 09/26/2020   Lab Results  Component Value Date   HGBA1C 6.0 (H) 09/26/2020  HGBA1C 6.0 (H) 04/25/2020   HGBA1C 5.7 (H) 06/29/2019   HGBA1C 5.7 (H) 01/31/2019   HGBA1C 6.2 (H) 02/24/2018   Lab Results  Component Value Date   INSULIN 11.0 09/26/2020   INSULIN 9.4 04/25/2020   INSULIN 14.5 06/29/2019   INSULIN 8.2 01/31/2019   INSULIN 10.7 02/24/2018   Lab Results  Component Value Date   TSH 1.410 02/24/2018   Lab Results  Component Value Date   CHOL 166 04/25/2020   HDL 54 04/25/2020   LDLCALC 98 04/25/2020    TRIG 71 04/25/2020   CHOLHDL 3.1 04/25/2020   Lab Results  Component Value Date   VD25OH 37.6 09/26/2020   VD25OH 34.1 04/25/2020   VD25OH 48.7 06/29/2019   Lab Results  Component Value Date   WBC 5.3 04/25/2020   HGB 14.2 04/25/2020   HCT 42.3 04/25/2020   MCV 89 04/25/2020   PLT 264 04/25/2020   Attestation Statements:   Reviewed by clinician on day of visit: allergies, medications, problem list, medical history, surgical history, family history, social history, and previous encounter notes.  I, Water quality scientist, CMA, am acting as Location manager for Mina Marble, NP.  I have reviewed the above documentation for accuracy and completeness, and I agree with the above. -  Laneka Mcgrory d. Hardy Harcum, NP-C

## 2020-10-31 NOTE — Telephone Encounter (Signed)
Prescription refill request for Eliquis received. Indication:Afib  Last office visit: 07/05/20 ([place]) Scr: 0.84 (09/26/20) Age: 60 Weight: 117.5kg  Appropriate dose and refill sent to requested pharmacy.

## 2020-11-15 ENCOUNTER — Other Ambulatory Visit (HOSPITAL_COMMUNITY): Payer: Self-pay

## 2020-11-28 ENCOUNTER — Encounter (INDEPENDENT_AMBULATORY_CARE_PROVIDER_SITE_OTHER): Payer: Self-pay | Admitting: Adult Health

## 2020-11-28 ENCOUNTER — Ambulatory Visit (INDEPENDENT_AMBULATORY_CARE_PROVIDER_SITE_OTHER): Payer: 59 | Admitting: Adult Health

## 2020-11-28 ENCOUNTER — Other Ambulatory Visit: Payer: Self-pay

## 2020-11-28 ENCOUNTER — Other Ambulatory Visit (HOSPITAL_COMMUNITY): Payer: Self-pay

## 2020-11-28 VITALS — BP 118/63 | HR 60 | Temp 98.1°F | Ht 67.0 in | Wt 256.0 lb

## 2020-11-28 DIAGNOSIS — Z9189 Other specified personal risk factors, not elsewhere classified: Secondary | ICD-10-CM

## 2020-11-28 DIAGNOSIS — Z6841 Body Mass Index (BMI) 40.0 and over, adult: Secondary | ICD-10-CM | POA: Diagnosis not present

## 2020-11-28 DIAGNOSIS — E559 Vitamin D deficiency, unspecified: Secondary | ICD-10-CM | POA: Diagnosis not present

## 2020-11-28 DIAGNOSIS — R7303 Prediabetes: Secondary | ICD-10-CM | POA: Diagnosis not present

## 2020-11-28 MED ORDER — VITAMIN D (ERGOCALCIFEROL) 1.25 MG (50000 UNIT) PO CAPS
50000.0000 [IU] | ORAL_CAPSULE | ORAL | 0 refills | Status: DC
Start: 2020-11-28 — End: 2020-12-25
  Filled 2020-11-28: qty 4, 28d supply, fill #0

## 2020-11-28 MED ORDER — OZEMPIC (0.25 OR 0.5 MG/DOSE) 2 MG/1.5ML ~~LOC~~ SOPN
0.5000 mg | PEN_INJECTOR | SUBCUTANEOUS | 0 refills | Status: DC
  Filled 2020-11-28 (×2): qty 4.5, 84d supply, fill #0

## 2020-11-28 NOTE — Progress Notes (Signed)
Chief Complaint:   OBESITY Tonya Nicholson is here to discuss her progress with her obesity treatment plan along with follow-up of her obesity related diagnoses. Tonya Nicholson is on the Category 2 Plan and states she is following her eating plan approximately 50% of the time. Tonya Nicholson states she walked at [employer].  Today's visit was #: 51 Starting weight: 263 lbs Starting date: 02/24/2018 Today's weight: 256 lbs Today's date: 11/28/2020 Total lbs lost to date: 7 lbs Total lbs lost since last in-office visit: 3 lbs  Interim History: Tonya Nicholson walked 8,000-10,000 steps when at [employer] - at [employer] for 3 days. Started Ozempic 0.25 mg once weekly - has had 5 doses on this strength.  Subjective:   1. Prediabetes On 09/26/2020, A1c was 6.0. Started Ozempic 0.25 mg once weekly - has had 5 doses on this strength. She denies mass in neck, dysphagia, dyspepsia, or persistent hoarseness.  2. Vitamin D deficiency On 09/26/2020, vitamin D level - 37.6 - below goal of 50. She is currently taking prescription ergocalciferol 50,000 IU each week. She denies nausea, vomiting or muscle weakness.  3. At risk for constipation Tonya Nicholson is at increased risk for constipation due to inadequate water intake, changes in diet, and/or use of medications such as GLP1 agonists. Tonya Nicholson denies hard, infrequent stools currently.   Assessment/Plan:   1. Prediabetes Increase and refill Ozempic to 0.5 mg subcutaneously once weekly.  - Increase and refill Semaglutide,0.25 or 0.5MG /DOS, (OZEMPIC, 0.25 OR 0.5 MG/DOSE,) 2 MG/1.5ML SOPN; Inject 0.5 mg into the skin once a week.  Dispense: 4.5 mL; Refill: 0  2. Vitamin D deficiency Refill ergocalciferol 50,000 IU once weekly.  - Refill Vitamin D, Ergocalciferol, (DRISDOL) 1.25 MG (50000 UNIT) CAPS capsule; Take 1 capsule (50,000 Units total) by mouth every 7 (seven) days.  Dispense: 4 capsule; Refill: 0  3. At risk for constipation Tonya Nicholson was given approximately 15 minutes of counseling  today regarding prevention of constipation. She was encouraged to increase water and fiber intake.   4. Obesity with current BMI 40.2  Tonya Nicholson is currently in the action stage of change. As such, her goal is to continue with weight loss efforts. She has agreed to the Category 2 Plan.   Exercise goals:  As is.  Behavioral modification strategies: increasing lean protein intake, decreasing simple carbohydrates, meal planning and cooking strategies, keeping healthy foods in the home, and planning for success.  Tonya Nicholson has agreed to follow-up with our clinic in 4 weeks. She was informed of the importance of frequent follow-up visits to maximize her success with intensive lifestyle modifications for her multiple health conditions.   Objective:   Blood pressure 118/63, pulse 60, temperature 98.1 F (36.7 C), height 5\' 7"  (1.702 m), weight 256 lb (116.1 kg), last menstrual period 04/09/2016, SpO2 99 %. Body mass index is 40.1 kg/m.  General: Cooperative, alert, well developed, in no acute distress. HEENT: Conjunctivae and lids unremarkable. Cardiovascular: Regular rhythm.  Lungs: Normal work of breathing. Neurologic: No focal deficits.   Lab Results  Component Value Date   CREATININE 0.84 09/26/2020   BUN 13 09/26/2020   NA 140 09/26/2020   K 4.6 09/26/2020   CL 103 09/26/2020   CO2 25 09/26/2020   Lab Results  Component Value Date   ALT 19 09/26/2020   AST 17 09/26/2020   ALKPHOS 75 09/26/2020   BILITOT 0.5 09/26/2020   Lab Results  Component Value Date   HGBA1C 6.0 (H) 09/26/2020   HGBA1C 6.0 (  H) 04/25/2020   HGBA1C 5.7 (H) 06/29/2019   HGBA1C 5.7 (H) 01/31/2019   HGBA1C 6.2 (H) 02/24/2018   Lab Results  Component Value Date   INSULIN 11.0 09/26/2020   INSULIN 9.4 04/25/2020   INSULIN 14.5 06/29/2019   INSULIN 8.2 01/31/2019   INSULIN 10.7 02/24/2018   Lab Results  Component Value Date   TSH 1.410 02/24/2018   Lab Results  Component Value Date   CHOL 166  04/25/2020   HDL 54 04/25/2020   LDLCALC 98 04/25/2020   TRIG 71 04/25/2020   CHOLHDL 3.1 04/25/2020   Lab Results  Component Value Date   VD25OH 37.6 09/26/2020   VD25OH 34.1 04/25/2020   VD25OH 48.7 06/29/2019   Lab Results  Component Value Date   WBC 5.3 04/25/2020   HGB 14.2 04/25/2020   HCT 42.3 04/25/2020   MCV 89 04/25/2020   PLT 264 04/25/2020   Attestation Statements:   Reviewed by clinician on day of visit: allergies, medications, problem list, medical history, surgical history, family history, social history, and previous encounter notes.  I, Water quality scientist, CMA, am acting as Location manager for Mina Marble, NP.  I have reviewed the above documentation for accuracy and completeness, and I agree with the above. -  Fidencia Mccloud d. Jolonda Gomm, NP-C

## 2020-12-25 ENCOUNTER — Other Ambulatory Visit (INDEPENDENT_AMBULATORY_CARE_PROVIDER_SITE_OTHER): Payer: Self-pay | Admitting: Adult Health

## 2020-12-25 ENCOUNTER — Other Ambulatory Visit (HOSPITAL_COMMUNITY): Payer: Self-pay

## 2020-12-25 DIAGNOSIS — E559 Vitamin D deficiency, unspecified: Secondary | ICD-10-CM

## 2020-12-25 MED ORDER — VITAMIN D (ERGOCALCIFEROL) 1.25 MG (50000 UNIT) PO CAPS
50000.0000 [IU] | ORAL_CAPSULE | ORAL | 0 refills | Status: DC
Start: 2020-12-25 — End: 2021-01-09
  Filled 2020-12-25: qty 4, 28d supply, fill #0

## 2020-12-25 MED FILL — Flecainide Acetate Tab 50 MG: ORAL | 30 days supply | Qty: 60 | Fill #3 | Status: CN

## 2020-12-25 NOTE — Telephone Encounter (Signed)
LAST APPOINTMENT DATE: 11/28/20 NEXT APPOINTMENT DATE: 01/09/21   [facility] [id number] [place] [address] Phone: [phone] Fax: [place] [address] Phone: [phone] Fax: [phone]  Patient is requesting a refill of the following medications: Pending Prescriptions:                       Disp   Refills   Vitamin D, Ergocalciferol, (DRISDOL) 1.25 *4 caps*0       Sig: Take 1 capsule (50,000 Units total) by mouth every 7          (seven) days.   Date last filled: 11/28/20 Previously prescribed by [facility]  Lab Results      Component                Value               Date                      HGBA1C                   6.0 (H)             09/26/2020                HGBA1C                   6.0 (H)             04/25/2020                HGBA1C                   5.7 (H)             06/29/2019           Lab Results      Component                Value               Date                      LDLCALC                  98                  04/25/2020                CREATININE               0.84                09/26/2020           Lab Results      Component                Value               Date                      VD25OH                   37.6                09/26/2020                VD25OH  34.1                04/25/2020                VD25OH                   48.7                06/29/2019            BP Readings from Last 3 Encounters: 11/28/20 : 118/63 10/30/20 : 110/67 10/16/20 : (!) 104/56

## 2020-12-26 ENCOUNTER — Other Ambulatory Visit (HOSPITAL_COMMUNITY): Payer: Self-pay

## 2020-12-26 ENCOUNTER — Ambulatory Visit (INDEPENDENT_AMBULATORY_CARE_PROVIDER_SITE_OTHER): Payer: 59 | Admitting: Adult Health

## 2020-12-26 MED FILL — Flecainide Acetate Tab 50 MG: ORAL | 30 days supply | Qty: 60 | Fill #3 | Status: AC

## 2021-01-09 ENCOUNTER — Ambulatory Visit (INDEPENDENT_AMBULATORY_CARE_PROVIDER_SITE_OTHER): Payer: 59 | Admitting: Adult Health

## 2021-01-09 ENCOUNTER — Other Ambulatory Visit (HOSPITAL_COMMUNITY): Payer: Self-pay

## 2021-01-09 ENCOUNTER — Encounter (INDEPENDENT_AMBULATORY_CARE_PROVIDER_SITE_OTHER): Payer: Self-pay | Admitting: Adult Health

## 2021-01-09 ENCOUNTER — Other Ambulatory Visit: Payer: Self-pay

## 2021-01-09 VITALS — BP 130/71 | HR 68 | Temp 97.3°F | Ht 67.0 in | Wt 251.0 lb

## 2021-01-09 DIAGNOSIS — R7303 Prediabetes: Secondary | ICD-10-CM | POA: Diagnosis not present

## 2021-01-09 DIAGNOSIS — Z9189 Other specified personal risk factors, not elsewhere classified: Secondary | ICD-10-CM

## 2021-01-09 DIAGNOSIS — Z6841 Body Mass Index (BMI) 40.0 and over, adult: Secondary | ICD-10-CM | POA: Diagnosis not present

## 2021-01-09 DIAGNOSIS — E559 Vitamin D deficiency, unspecified: Secondary | ICD-10-CM

## 2021-01-09 MED ORDER — SEMAGLUTIDE (1 MG/DOSE) 4 MG/3ML ~~LOC~~ SOPN
1.0000 mg | PEN_INJECTOR | SUBCUTANEOUS | 0 refills | Status: DC
  Filled 2021-01-09: qty 3, 28d supply, fill #0
  Filled 2021-02-04: qty 3, 28d supply, fill #1

## 2021-01-09 MED ORDER — VITAMIN D (ERGOCALCIFEROL) 1.25 MG (50000 UNIT) PO CAPS
50000.0000 [IU] | ORAL_CAPSULE | ORAL | 0 refills | Status: DC
  Filled 2021-01-09 – 2021-02-04 (×2): qty 4, 28d supply, fill #0

## 2021-01-09 NOTE — Progress Notes (Signed)
Chief Complaint:   OBESITY Tonya Nicholson is here to discuss her progress with her obesity treatment plan along with follow-up of her obesity related diagnoses. Tonya Nicholson is on the Category 2 Plan and states she is following her eating plan approximately 75% of the time. Tonya Nicholson states she is not exercising regularly.  Today's visit was #: 64 Starting weight: 263 lbs Starting date: 02/24/2018 Today's weight: 251 lbs Today's date: 01/09/2021 Total lbs lost to date: 12 lbs Total lbs lost since last in-office visit: 5 lbs  Interim History:  Tonya Nicholson's Ozempic dose was increased to 0.5 mg at last office visit on 11/28/2020. She denies mass in neck, dysphagia, dyspepsia, persistent hoarseness, or GI upset. She is able to consume all foods on plan with this dose of GLP-1.  Subjective:   1. Prediabetes Steadily worsening. Tonya Nicholson's Ozempic dose was increased to 0.5 mg at last office visit on 11/28/2020. She denies mass in neck, dysphagia, dyspepsia, persistent hoarseness, or GI upset. She is able to consume all foods on plan with this dose of GLP-1.  2. Vitamin D deficiency On 09/26/2020, vitamin D level - 37.6 - below goal of 50. She is currently taking prescription ergocalciferol 50,000 IU each week. She denies nausea, vomiting or muscle weakness.  3. At risk for constipation Tonya Nicholson is at increased risk for constipation due to GLP-1 therapy for prediabetes.  Assessment/Plan:   1. Prediabetes Refill and increase Ozempic to 1 mg once weekly, as per below.  - Refill and increase Semaglutide, 1 MG/DOSE, 4 MG/3ML SOPN; Inject 1 mg into the skin once a week.  Dispense: 6 mL; Refill: 0  2. Vitamin D deficiency Refill ergocalciferol 50,000 IU once weekly.  - Refill Vitamin D, Ergocalciferol, (DRISDOL) 1.25 MG (50000 UNIT) CAPS capsule; Take 1 capsule (50,000 Units total) by mouth every 7 (seven) days.  Dispense: 4 capsule; Refill: 0  3. At risk for constipation Tonya Nicholson was given approximately 15 minutes  of counseling today regarding prevention of constipation. She was encouraged to increase water and fiber intake.   4. Obesity with current BMI 39.3  Tonya Nicholson is currently in the action stage of change. As such, her goal is to continue with weight loss efforts. She has agreed to the Category 2 Plan.   Exercise goals:  Resume going to the gym after the new year.  Behavioral modification strategies: increasing lean protein intake, decreasing simple carbohydrates, meal planning and cooking strategies, keeping healthy foods in the home, and planning for success.  Tonya Nicholson has agreed to follow-up with our clinic in 4 weeks. She was informed of the importance of frequent follow-up visits to maximize her success with intensive lifestyle modifications for her multiple health conditions.   Objective:   Blood pressure 130/71, pulse 68, temperature (!) 97.3 F (36.3 C), height 5\' 7"  (1.702 m), weight 251 lb (113.9 kg), last menstrual period 04/09/2016, SpO2 97 %. Body mass index is 39.31 kg/m.  General: Cooperative, alert, well developed, in no acute distress. HEENT: Conjunctivae and lids unremarkable. Cardiovascular: Regular rhythm.  Lungs: Normal work of breathing. Neurologic: No focal deficits.   Lab Results  Component Value Date   CREATININE 0.84 09/26/2020   BUN 13 09/26/2020   NA 140 09/26/2020   K 4.6 09/26/2020   CL 103 09/26/2020   CO2 25 09/26/2020   Lab Results  Component Value Date   ALT 19 09/26/2020   AST 17 09/26/2020   ALKPHOS 75 09/26/2020   BILITOT 0.5 09/26/2020   Lab Results  Component Value Date   HGBA1C 6.0 (H) 09/26/2020   HGBA1C 6.0 (H) 04/25/2020   HGBA1C 5.7 (H) 06/29/2019   HGBA1C 5.7 (H) 01/31/2019   HGBA1C 6.2 (H) 02/24/2018   Lab Results  Component Value Date   INSULIN 11.0 09/26/2020   INSULIN 9.4 04/25/2020   INSULIN 14.5 06/29/2019   INSULIN 8.2 01/31/2019   INSULIN 10.7 02/24/2018   Lab Results  Component Value Date   TSH 1.410 02/24/2018    Lab Results  Component Value Date   CHOL 166 04/25/2020   HDL 54 04/25/2020   LDLCALC 98 04/25/2020   TRIG 71 04/25/2020   CHOLHDL 3.1 04/25/2020   Lab Results  Component Value Date   VD25OH 37.6 09/26/2020   VD25OH 34.1 04/25/2020   VD25OH 48.7 06/29/2019   Lab Results  Component Value Date   WBC 5.3 04/25/2020   HGB 14.2 04/25/2020   HCT 42.3 04/25/2020   MCV 89 04/25/2020   PLT 264 04/25/2020   Attestation Statements:   Reviewed by clinician on day of visit: allergies, medications, problem list, medical history, surgical history, family history, social history, and previous encounter notes.  I, Water quality scientist, CMA, am acting as Location manager for Mina Marble, NP.  I have reviewed the above documentation for accuracy and completeness, and I agree with the above. -  Konnar Ben d. Millie Shorb, NP-C

## 2021-01-10 ENCOUNTER — Other Ambulatory Visit (HOSPITAL_COMMUNITY): Payer: Self-pay

## 2021-02-04 ENCOUNTER — Other Ambulatory Visit (HOSPITAL_COMMUNITY): Payer: Self-pay

## 2021-02-04 ENCOUNTER — Other Ambulatory Visit: Payer: Self-pay | Admitting: Cardiology

## 2021-02-04 MED ORDER — FLECAINIDE ACETATE 50 MG PO TABS
50.0000 mg | ORAL_TABLET | Freq: Two times a day (BID) | ORAL | 5 refills | Status: DC
  Filled 2021-02-04: qty 60, 30d supply, fill #0
  Filled 2021-02-27: qty 60, 30d supply, fill #1
  Filled 2021-03-21 – 2021-03-27 (×2): qty 60, 30d supply, fill #2
  Filled 2021-05-01: qty 60, 30d supply, fill #3
  Filled 2021-05-22 – 2021-05-29 (×2): qty 60, 30d supply, fill #4

## 2021-02-04 NOTE — Telephone Encounter (Signed)
Yes please refill and add back to pt medication list

## 2021-02-06 ENCOUNTER — Encounter (INDEPENDENT_AMBULATORY_CARE_PROVIDER_SITE_OTHER): Payer: Self-pay | Admitting: Adult Health

## 2021-02-06 ENCOUNTER — Ambulatory Visit (INDEPENDENT_AMBULATORY_CARE_PROVIDER_SITE_OTHER): Payer: 59 | Admitting: Adult Health

## 2021-02-06 ENCOUNTER — Other Ambulatory Visit: Payer: Self-pay

## 2021-02-06 VITALS — BP 121/70 | HR 70 | Temp 97.8°F | Ht 67.0 in | Wt 247.0 lb

## 2021-02-06 DIAGNOSIS — Z6838 Body mass index (BMI) 38.0-38.9, adult: Secondary | ICD-10-CM | POA: Diagnosis not present

## 2021-02-06 DIAGNOSIS — R7303 Prediabetes: Secondary | ICD-10-CM | POA: Diagnosis not present

## 2021-02-06 NOTE — Progress Notes (Signed)
Chief Complaint:   OBESITY Tonya Nicholson is here to discuss her progress with her obesity treatment plan along with follow-up of her obesity related diagnoses. Tonya Nicholson is on the Category 2 Plan and states she is following her eating plan approximately 75% of the time. Tonya Nicholson states she is walking for 30 minutes 3 times per week.  Today's visit was #: 31 Starting weight: 263 lbs Starting date: 02/24/2018 Today's weight: 247 lbs Today's date: 02/06/2021 Total lbs lost to date: 16 lbs Total lbs lost since last in-office visit: 4 lbs  Interim History:  Tonya Nicholson is on Ozempic 1 mg- denies mass in neck, dysphagia, dyspepsia, persistent hoarseness, or GI upset.  2023 Goals:   1) Remain as active as possible.   2) Continue with steady weight loss.  Subjective:   1. Prediabetes Tonya Nicholson is on Ozempic 1 mg- denies mass in neck, dysphagia, dyspepsia, persistent hoarseness, or GI upset. She reports appetite is well controlled on this dose of GLP-1.  Assessment/Plan:   1. Prediabetes Continue Ozempic, Category 2 meal plan, increase daily walking.  2. Obesity with current BMI 38.8  Tonya Nicholson is currently in the action stage of change. As such, her goal is to continue with weight loss efforts. She has agreed to the Category 2 Plan.   Exercise goals:  As is.  Behavioral modification strategies: increasing lean protein intake, decreasing simple carbohydrates, meal planning and cooking strategies, keeping healthy foods in the home, and planning for success.  Tonya Nicholson has agreed to follow-up with our clinic in 4 weeks. She was informed of the importance of frequent follow-up visits to maximize her success with intensive lifestyle modifications for her multiple health conditions.   Objective:   Blood pressure 121/70, pulse 70, temperature 97.8 F (36.6 C), height 5\' 7"  (1.702 m), weight 247 lb (112 kg), last menstrual period 04/09/2016, SpO2 97 %. Body mass index is 38.69 kg/m.  General: Cooperative,  alert, well developed, in no acute distress. HEENT: Conjunctivae and lids unremarkable. Cardiovascular: Regular rhythm.  Lungs: Normal work of breathing. Neurologic: No focal deficits.   Lab Results  Component Value Date   CREATININE 0.84 09/26/2020   BUN 13 09/26/2020   NA 140 09/26/2020   K 4.6 09/26/2020   CL 103 09/26/2020   CO2 25 09/26/2020   Lab Results  Component Value Date   ALT 19 09/26/2020   AST 17 09/26/2020   ALKPHOS 75 09/26/2020   BILITOT 0.5 09/26/2020   Lab Results  Component Value Date   HGBA1C 6.0 (H) 09/26/2020   HGBA1C 6.0 (H) 04/25/2020   HGBA1C 5.7 (H) 06/29/2019   HGBA1C 5.7 (H) 01/31/2019   HGBA1C 6.2 (H) 02/24/2018   Lab Results  Component Value Date   INSULIN 11.0 09/26/2020   INSULIN 9.4 04/25/2020   INSULIN 14.5 06/29/2019   INSULIN 8.2 01/31/2019   INSULIN 10.7 02/24/2018   Lab Results  Component Value Date   TSH 1.410 02/24/2018   Lab Results  Component Value Date   CHOL 166 04/25/2020   HDL 54 04/25/2020   LDLCALC 98 04/25/2020   TRIG 71 04/25/2020   CHOLHDL 3.1 04/25/2020   Lab Results  Component Value Date   VD25OH 37.6 09/26/2020   VD25OH 34.1 04/25/2020   VD25OH 48.7 06/29/2019   Lab Results  Component Value Date   WBC 5.3 04/25/2020   HGB 14.2 04/25/2020   HCT 42.3 04/25/2020   MCV 89 04/25/2020   PLT 264 04/25/2020   Attestation Statements:  Reviewed by clinician on day of visit: allergies, medications, problem list, medical history, surgical history, family history, social history, and previous encounter notes.  Time spent on visit including pre-visit chart review and post-visit care and charting was 27 minutes.   I, Water quality scientist, CMA, am acting as Location manager for Mina Marble, NP.  I have reviewed the above documentation for accuracy and completeness, and I agree with the above. -  Jamarii Banks d. B[place] Sandoval, NP-C

## 2021-02-27 ENCOUNTER — Other Ambulatory Visit (HOSPITAL_COMMUNITY): Payer: Self-pay

## 2021-02-27 ENCOUNTER — Other Ambulatory Visit: Payer: Self-pay

## 2021-02-27 ENCOUNTER — Encounter (INDEPENDENT_AMBULATORY_CARE_PROVIDER_SITE_OTHER): Payer: Self-pay | Admitting: Adult Health

## 2021-02-27 ENCOUNTER — Other Ambulatory Visit (INDEPENDENT_AMBULATORY_CARE_PROVIDER_SITE_OTHER): Payer: Self-pay | Admitting: Adult Health

## 2021-02-27 ENCOUNTER — Ambulatory Visit (INDEPENDENT_AMBULATORY_CARE_PROVIDER_SITE_OTHER): Payer: 59 | Admitting: Adult Health

## 2021-02-27 VITALS — BP 92/59 | HR 69 | Temp 97.6°F | Ht 67.0 in | Wt 246.0 lb

## 2021-02-27 DIAGNOSIS — Z9189 Other specified personal risk factors, not elsewhere classified: Secondary | ICD-10-CM | POA: Diagnosis not present

## 2021-02-27 DIAGNOSIS — Z6838 Body mass index (BMI) 38.0-38.9, adult: Secondary | ICD-10-CM

## 2021-02-27 DIAGNOSIS — Z6841 Body Mass Index (BMI) 40.0 and over, adult: Secondary | ICD-10-CM

## 2021-02-27 DIAGNOSIS — R7303 Prediabetes: Secondary | ICD-10-CM

## 2021-02-27 DIAGNOSIS — H5213 Myopia, bilateral: Secondary | ICD-10-CM | POA: Diagnosis not present

## 2021-02-27 DIAGNOSIS — E559 Vitamin D deficiency, unspecified: Secondary | ICD-10-CM

## 2021-02-27 DIAGNOSIS — E669 Obesity, unspecified: Secondary | ICD-10-CM | POA: Diagnosis not present

## 2021-02-27 MED ORDER — VITAMIN D (ERGOCALCIFEROL) 1.25 MG (50000 UNIT) PO CAPS
50000.0000 [IU] | ORAL_CAPSULE | ORAL | 0 refills | Status: DC
  Filled 2021-02-27: qty 4, 28d supply, fill #0

## 2021-02-27 MED ORDER — SEMAGLUTIDE (1 MG/DOSE) 4 MG/3ML ~~LOC~~ SOPN
1.0000 mg | PEN_INJECTOR | SUBCUTANEOUS | 0 refills | Status: DC
  Filled 2021-02-27: qty 3, 28d supply, fill #0
  Filled 2021-03-21: qty 3, 28d supply, fill #1

## 2021-02-27 NOTE — Progress Notes (Signed)
Chief Complaint:   OBESITY Tonya Nicholson is here to discuss her progress with her obesity treatment plan along with follow-up of her obesity related diagnoses. Tonya Nicholson is on the Category 2 Plan and states she is following her eating plan approximately 75% of the time. Tonya Nicholson states she is walking at work.  Today's visit was #: 55 Starting weight: 263 lbs Starting date: 02/24/2018 Today's weight: 246 lbs Today's date: 02/27/2021 Total lbs lost to date: 17 lbs Total lbs lost since last in-office visit: 1 lb  Interim History:  Tonya Nicholson is on Ozempic 1 mg - 5 injections at this strength. She injects on Thursday. She denies medication SE.  Of Note- BP soft, yet stable. She denies sx's of hypotension.  Subjective:   1. Prediabetes Tonya Nicholson is on Ozempic 1 mg - 5 injections at this strength. She injects on Thursday. She denies mass in neck, dysphagia, dyspepsia, persistent hoarseness, or GI upset.  2. Vitamin D deficiency On 09/26/2020, vitamin D level - 37.6. She is currently taking prescription ergocalciferol 50,000 IU each week. She denies nausea, vomiting or muscle weakness.  3. At risk for osteoporosis [place] is at higher risk of osteopenia and osteoporosis due to Vitamin D deficiency and obesity.   Assessment/Plan:   1. Prediabetes Check labs at next office visit. Refill Ozempic 1 mg once weekly, as per below.  - Refill Semaglutide, 1 MG/DOSE, 4 MG/3ML SOPN; Inject 1 mg into the skin once a week.  Dispense: 6 mL; Refill: 0  2. Vitamin D deficiency Check labs at next office visit. Refill ergocalciferol 50,000 IU once weekly.  - Refill Vitamin D, Ergocalciferol, (DRISDOL) 1.25 MG (50000 UNIT) CAPS capsule; Take 1 capsule (50,000 Units total) by mouth every 7 (seven) days.  Dispense: 4 capsule; Refill: 0  3. At risk for osteoporosis Tonya Nicholson was given approximately 15 minutes of osteoporosis prevention counseling today. Tonya Nicholson is at risk for osteopenia and osteoporosis due to her Vitamin D  deficiency. She was encouraged to take her Vit D and follow her higher calcium diet and increase strengthening exercise to help strengthen her bones and decrease her risk of osteopenia and osteoporosis.   4. Obesity with current BMI 38.5  Tonya Nicholson is currently in the action stage of change. As such, her goal is to continue with weight loss efforts. She has agreed to the Category 2 Plan.   Exercise goals:  As is.  Behavioral modification strategies: increasing lean protein intake, decreasing simple carbohydrates, meal planning and cooking strategies, keeping healthy foods in the home, and planning for success.  Tonya Nicholson has agreed to follow-up with our clinic in 3 weeks. She was informed of the importance of frequent follow-up visits to maximize her success with intensive lifestyle modifications for her multiple health conditions.   Objective:   Blood pressure (!) 92/59, pulse 69, temperature 97.6 F (36.4 C), height 5\' 7"  (1.702 m), weight 246 lb (111.6 kg), last menstrual period 04/09/2016, SpO2 98 %. Body mass index is 38.53 kg/m.  General: Cooperative, alert, well developed, in no acute distress. HEENT: Conjunctivae and lids unremarkable. Cardiovascular: Regular rhythm.  Lungs: Normal work of breathing. Neurologic: No focal deficits.   Lab Results  Component Value Date   CREATININE 0.84 09/26/2020   BUN 13 09/26/2020   NA 140 09/26/2020   K 4.6 09/26/2020   CL 103 09/26/2020   CO2 25 09/26/2020   Lab Results  Component Value Date   ALT 19 09/26/2020   AST 17 09/26/2020   ALKPHOS  75 09/26/2020   BILITOT 0.5 09/26/2020   Lab Results  Component Value Date   HGBA1C 6.0 (H) 09/26/2020   HGBA1C 6.0 (H) 04/25/2020   HGBA1C 5.7 (H) 06/29/2019   HGBA1C 5.7 (H) 01/31/2019   HGBA1C 6.2 (H) 02/24/2018   Lab Results  Component Value Date   INSULIN 11.0 09/26/2020   INSULIN 9.4 04/25/2020   INSULIN 14.5 06/29/2019   INSULIN 8.2 01/31/2019   INSULIN 10.7 02/24/2018   Lab  Results  Component Value Date   TSH 1.410 02/24/2018   Lab Results  Component Value Date   CHOL 166 04/25/2020   HDL 54 04/25/2020   LDLCALC 98 04/25/2020   TRIG 71 04/25/2020   CHOLHDL 3.1 04/25/2020   Lab Results  Component Value Date   VD25OH 37.6 09/26/2020   VD25OH 34.1 04/25/2020   VD25OH 48.7 06/29/2019   Lab Results  Component Value Date   WBC 5.3 04/25/2020   HGB 14.2 04/25/2020   HCT 42.3 04/25/2020   MCV 89 04/25/2020   PLT 264 04/25/2020   Attestation Statements:   Reviewed by clinician on day of visit: allergies, medications, problem list, medical history, surgical history, family history, social history, and previous encounter notes.  I, Water quality scientist, CMA, am acting as Location manager for Mina Marble, NP.  I have reviewed the above documentation for accuracy and completeness, and I agree with the above. -  Kiely Cousar d. Ashla Murph, NP-C

## 2021-02-27 NOTE — Telephone Encounter (Signed)
LAST APPOINTMENT DATE: 02/06/21 NEXT APPOINTMENT DATE: 02/27/21   [facility] [id number] [place] [address] Phone: [phone] Fax: [place] [address] Phone: [phone] Fax: [phone]  Patient is requesting a refill of the following medications: Pending Prescriptions:                       Disp   Refills   Semaglutide, 1 MG/DOSE, (OZEMPIC, 1 MG/DOS*6 mL   0       Sig: Inject 1 mg into the skin once a week.   Vitamin D, Ergocalciferol, (DRISDOL) 1.25 *4 caps*0       Sig: Take 1 capsule (50,000 Units total) by mouth every 7          (seven) days.   Date last filled: 01/09/21 Previously prescribed by [facility]  Lab Results      Component                Value               Date                      HGBA1C                   6.0 (H)             09/26/2020                HGBA1C                   6.0 (H)             04/25/2020                HGBA1C                   5.7 (H)             06/29/2019           Lab Results      Component                Value               Date                      LDLCALC                  98                  04/25/2020                CREATININE               0.84                09/26/2020           Lab Results      Component                Value               Date                      VD25OH                   37.6  09/26/2020                VD25OH                   34.1                04/25/2020                VD25OH                   48.7                06/29/2019            BP Readings from Last 3 Encounters: 02/06/21 : 121/70 01/09/21 : 130/71 11/28/20 : 118/63

## 2021-02-28 ENCOUNTER — Other Ambulatory Visit (HOSPITAL_COMMUNITY): Payer: Self-pay

## 2021-03-19 ENCOUNTER — Ambulatory Visit (INDEPENDENT_AMBULATORY_CARE_PROVIDER_SITE_OTHER): Payer: 59 | Admitting: Adult Health

## 2021-03-21 ENCOUNTER — Other Ambulatory Visit (INDEPENDENT_AMBULATORY_CARE_PROVIDER_SITE_OTHER): Payer: Self-pay | Admitting: Cardiology

## 2021-03-21 ENCOUNTER — Other Ambulatory Visit (HOSPITAL_COMMUNITY): Payer: Self-pay

## 2021-03-21 DIAGNOSIS — I10 Essential (primary) hypertension: Secondary | ICD-10-CM

## 2021-03-21 MED ORDER — METOPROLOL TARTRATE 25 MG PO TABS
25.0000 mg | ORAL_TABLET | Freq: Two times a day (BID) | ORAL | 1 refills | Status: DC
  Filled 2021-03-21 – 2021-05-18 (×2): qty 180, 90d supply, fill #0
  Filled 2021-08-19: qty 180, 90d supply, fill #1

## 2021-03-24 ENCOUNTER — Other Ambulatory Visit (HOSPITAL_COMMUNITY): Payer: Self-pay

## 2021-03-24 MED ORDER — AMLODIPINE BESY-BENAZEPRIL HCL 5-40 MG PO CAPS
1.0000 | ORAL_CAPSULE | Freq: Every day | ORAL | 0 refills | Status: DC
  Filled 2021-03-24: qty 90, 90d supply, fill #0

## 2021-03-27 ENCOUNTER — Other Ambulatory Visit (HOSPITAL_COMMUNITY): Payer: Self-pay

## 2021-04-10 ENCOUNTER — Encounter (INDEPENDENT_AMBULATORY_CARE_PROVIDER_SITE_OTHER): Payer: Self-pay | Admitting: Adult Health

## 2021-04-10 ENCOUNTER — Ambulatory Visit (INDEPENDENT_AMBULATORY_CARE_PROVIDER_SITE_OTHER): Payer: 59 | Admitting: Adult Health

## 2021-04-10 ENCOUNTER — Other Ambulatory Visit (HOSPITAL_COMMUNITY): Payer: Self-pay

## 2021-04-10 ENCOUNTER — Other Ambulatory Visit: Payer: Self-pay

## 2021-04-10 VITALS — BP 96/68 | HR 86 | Temp 97.9°F | Ht 67.0 in | Wt 242.0 lb

## 2021-04-10 DIAGNOSIS — Z6838 Body mass index (BMI) 38.0-38.9, adult: Secondary | ICD-10-CM | POA: Diagnosis not present

## 2021-04-10 DIAGNOSIS — E669 Obesity, unspecified: Secondary | ICD-10-CM | POA: Diagnosis not present

## 2021-04-10 DIAGNOSIS — Z9189 Other specified personal risk factors, not elsewhere classified: Secondary | ICD-10-CM | POA: Diagnosis not present

## 2021-04-10 DIAGNOSIS — R7303 Prediabetes: Secondary | ICD-10-CM

## 2021-04-10 DIAGNOSIS — E559 Vitamin D deficiency, unspecified: Secondary | ICD-10-CM

## 2021-04-10 DIAGNOSIS — I10 Essential (primary) hypertension: Secondary | ICD-10-CM

## 2021-04-10 MED ORDER — SEMAGLUTIDE-WEIGHT MANAGEMENT 1 MG/0.5ML ~~LOC~~ SOAJ
1.0000 mg | SUBCUTANEOUS | 0 refills | Status: DC
  Filled 2021-04-10 – 2021-05-22 (×6): qty 2, 28d supply, fill #0

## 2021-04-10 NOTE — Progress Notes (Signed)
? ? ? ?Chief Complaint:  ? ?OBESITY ?Tonya Nicholson is here to discuss her progress with her obesity treatment plan along with follow-up of her obesity related diagnoses. Tonya Nicholson is on the Category 2 Plan and states she is following her eating plan approximately 50% of the time. Tonya Nicholson states she is going to the gym for 30 minutes 2-3 times per week. ? ?Today's visit was #: 41 ?Starting weight: 263 lbs ?Starting date: 02/24/2018 ?Today's weight: 242 lbs ?Today's date: 04/10/2021 ?Total lbs lost to date: 21 lbs ?Total lbs lost since last in-office visit: 4 lbs ? ?Interim History: Tonya Nicholson says she has resumed regular gym sessions, often goes with her daughter. ?She continues to tolerate Ozempic '1mg'$ - down another 4 lbs since last OV! ? ?Subjective:  ? ?1. Essential hypertension ?BP stable - for her [place]. ?She denies sx's of hypotension. ? ?2. Vitamin D deficiency ?She is currently taking prescription ergocalciferol 50,000 IU each week. She denies nausea, vomiting or muscle weakness. ? ?3. Prediabetes ?Ozempic 1 mg once week- denies mass in neck, dysphagia, dyspepsia, persistent hoarseness, abd pain, or N/V/Constipation. ? ?4. At risk for diabetes mellitus ?Tonya Nicholson is at higher than average risk for developing diabetes due to obesity.  ? ?Assessment/Plan:  ? ?1. Essential hypertension ?Check labs today. ? ?- Comprehensive metabolic panel ? ?2. Vitamin D deficiency ?Check labs and then MyChart patient with results and refill ergocalciferol as appropriate. ? ?- VITAMIN D 25 Hydroxy (Vit-D Deficiency, Fractures) ? ?3. Prediabetes ?Change from Ozempic to [facility] - insurance coverage. ?Check labs today. ? ?- Start Semaglutide-Weight Management 1 MG/0.5ML SOAJ; Inject 1 mg into the skin once a week.  Dispense: 2 mL; Refill: 0 ?- Hemoglobin A1c ?- Insulin, random ? ?4. At risk for diabetes mellitus ?Tonya Nicholson was given approximately 15 minutes of diabetic education and counseling today. We discussed intensive lifestyle  modifications today with an emphasis on weight loss as well as increasing exercise and decreasing simple carbohydrates in her diet. We also reviewed medication options with an emphasis on risk versus benefits of those discussed. ? ?Repetitive spaced learning was employed today to elicit superior memory formation and behavioral change. ? ?5. Obesity with current BMI 38.0 ? ?Tonya Nicholson is currently in the action stage of change. As such, her goal is to continue with weight loss efforts. She has agreed to the Category 2 Plan.  ? ?Increase Category 2 Meal Plan compliance to 70-80%. ? ?Start Wegovy 1 mg once weekly. ? ?Exercise goals:  As is. ? ?Behavioral modification strategies: increasing lean protein intake, decreasing simple carbohydrates, meal planning and cooking strategies, keeping healthy foods in the home, and planning for success. ? ?Tonya Nicholson has agreed to follow-up with our clinic in 4-6 weeks. She was informed of the importance of frequent follow-up visits to maximize her success with intensive lifestyle modifications for her multiple health conditions.  ? ?Tonya Nicholson was informed we would discuss her lab results at her next visit unless there is a critical issue that needs to be addressed sooner. Tonya Nicholson agreed to keep her next visit at the agreed upon time to discuss these results. ? ?Objective:  ? ?Blood pressure 96/68, pulse 86, temperature 97.9 ?F (36.6 ?C), height '5\' 7"'$  (1.702 m), weight 242 lb (109.8 kg), last menstrual period 04/09/2016, SpO2 98 %. ?Body mass index is 37.9 kg/m?. ? ?General: Cooperative, alert, well developed, in no acute distress. ?HEENT: Conjunctivae and lids unremarkable. ?Cardiovascular: Regular rhythm.  ?Lungs: Normal work of breathing. ?Neurologic: No focal deficits.  ? ?  Lab Results  ?Component Value Date  ? CREATININE 0.84 09/26/2020  ? BUN 13 09/26/2020  ? NA 140 09/26/2020  ? K 4.6 09/26/2020  ? CL 103 09/26/2020  ? CO2 25 09/26/2020  ? ?Lab Results  ?Component Value Date  ? ALT 19  09/26/2020  ? AST 17 09/26/2020  ? ALKPHOS 75 09/26/2020  ? BILITOT 0.5 09/26/2020  ? ?Lab Results  ?Component Value Date  ? HGBA1C 6.0 (H) 09/26/2020  ? HGBA1C 6.0 (H) 04/25/2020  ? HGBA1C 5.7 (H) 06/29/2019  ? HGBA1C 5.7 (H) 01/31/2019  ? HGBA1C 6.2 (H) 02/24/2018  ? ?Lab Results  ?Component Value Date  ? INSULIN 11.0 09/26/2020  ? INSULIN 9.4 04/25/2020  ? INSULIN 14.5 06/29/2019  ? INSULIN 8.2 01/31/2019  ? INSULIN 10.7 02/24/2018  ? ?Lab Results  ?Component Value Date  ? TSH 1.410 02/24/2018  ? ?Lab Results  ?Component Value Date  ? CHOL 166 04/25/2020  ? HDL 54 04/25/2020  ? [place] 98 04/25/2020  ? TRIG 71 04/25/2020  ? CHOLHDL 3.1 04/25/2020  ? ?Lab Results  ?Component Value Date  ? VD25OH 37.6 09/26/2020  ? VD25OH 34.1 04/25/2020  ? VD25OH 48.7 06/29/2019  ? ?Lab Results  ?Component Value Date  ? WBC 5.3 04/25/2020  ? HGB 14.2 04/25/2020  ? HCT 42.3 04/25/2020  ? MCV 89 04/25/2020  ? PLT 264 04/25/2020  ? ?Attestation Statements:  ? ?Reviewed by clinician on day of visit: allergies, medications, problem list, medical history, surgical history, family history, social history, and previous encounter notes. ? ?I, Water quality scientist, CMA, am acting as Location manager for Mina Marble, NP. ? ?I have reviewed the above documentation for accuracy and completeness, and I agree with the above. -  Loye Vento d. Virtie Bungert, NP-C ?

## 2021-04-11 LAB — COMPREHENSIVE METABOLIC PANEL
ALT: 15 IU/L (ref 0–32)
AST: 19 IU/L (ref 0–40)
Albumin/Globulin Ratio: 1.4 (ref 1.2–2.2)
Albumin: 4.1 g/dL (ref 3.8–4.8)
Alkaline Phosphatase: 73 IU/L (ref 44–121)
BUN/Creatinine Ratio: 15 (ref 12–28)
BUN: 14 mg/dL (ref 8–27)
Bilirubin Total: 0.5 mg/dL (ref 0.0–1.2)
CO2: 22 mmol/L (ref 20–29)
Calcium: 9.4 mg/dL (ref 8.7–10.3)
Chloride: 104 mmol/L (ref 96–106)
Creatinine, Ser: 0.91 mg/dL (ref 0.57–1.00)
Globulin, Total: 2.9 g/dL (ref 1.5–4.5)
Glucose: 87 mg/dL (ref 70–99)
Potassium: 4.4 mmol/L (ref 3.5–5.2)
Sodium: 141 mmol/L (ref 134–144)
Total Protein: 7 g/dL (ref 6.0–8.5)
eGFR: 72 mL/min/{1.73_m2} (ref >59)

## 2021-04-11 LAB — HEMOGLOBIN A1C
Est. average glucose Bld gHb Est-mCnc: 105 mg/dL
Hgb A1c MFr Bld: 5.3 % (ref 4.8–5.6)

## 2021-04-11 LAB — INSULIN, RANDOM: INSULIN: 12.8 u[IU]/mL (ref 2.6–24.9)

## 2021-04-11 LAB — VITAMIN D 25 HYDROXY (VIT D DEFICIENCY, FRACTURES): Vit D, 25-Hydroxy: 58.6 ng/mL (ref 30.0–100.0)

## 2021-04-14 ENCOUNTER — Encounter (INDEPENDENT_AMBULATORY_CARE_PROVIDER_SITE_OTHER): Payer: Self-pay | Admitting: Adult Health

## 2021-04-14 ENCOUNTER — Other Ambulatory Visit (INDEPENDENT_AMBULATORY_CARE_PROVIDER_SITE_OTHER): Payer: Self-pay | Admitting: Adult Health

## 2021-04-14 ENCOUNTER — Other Ambulatory Visit (HOSPITAL_COMMUNITY): Payer: Self-pay

## 2021-04-14 DIAGNOSIS — E559 Vitamin D deficiency, unspecified: Secondary | ICD-10-CM

## 2021-04-14 MED ORDER — VITAMIN D (ERGOCALCIFEROL) 1.25 MG (50000 UNIT) PO CAPS
50000.0000 [IU] | ORAL_CAPSULE | ORAL | 0 refills | Status: DC
  Filled 2021-04-14: qty 4, 28d supply, fill #0

## 2021-04-17 ENCOUNTER — Other Ambulatory Visit (HOSPITAL_COMMUNITY): Payer: Self-pay

## 2021-04-17 ENCOUNTER — Other Ambulatory Visit (INDEPENDENT_AMBULATORY_CARE_PROVIDER_SITE_OTHER): Payer: Self-pay | Admitting: Adult Health

## 2021-04-17 ENCOUNTER — Encounter (INDEPENDENT_AMBULATORY_CARE_PROVIDER_SITE_OTHER): Payer: Self-pay | Admitting: Adult Health

## 2021-04-17 MED ORDER — SEMAGLUTIDE (1 MG/DOSE) 4 MG/3ML ~~LOC~~ SOPN
1.0000 mg | PEN_INJECTOR | SUBCUTANEOUS | 0 refills | Status: DC
  Filled 2021-04-17: qty 3, 28d supply, fill #0

## 2021-04-17 NOTE — Progress Notes (Unsigned)
Ozempic to bridge until [place] covered ?

## 2021-04-17 NOTE — Telephone Encounter (Signed)
PA still pending in Cover My Meds.

## 2021-04-18 ENCOUNTER — Other Ambulatory Visit (HOSPITAL_COMMUNITY): Payer: Self-pay

## 2021-04-21 NOTE — Progress Notes (Signed)
? ? ? ?05/01/2021 ?Tonya Nicholson ?[id number] ?09-09-1960 ? ? ?ASSESSMENT AND PLAN:  ? ?Screen for colon cancer ?We have discussed the risks of bleeding, infection, perforation, medication reactions, and remote risk of death associated with colonoscopy. All questions were answered and the patient acknowledges these risk and wishes to proceed. ? ?PAF (paroxysmal atrial fibrillation) ([place]) ?Patient told to hold her Eliquis for 2 days prior to time of procedure.   ?We will communicate with her prescribing physician Dr. Lennie Odor, to ensure that holding his Eliquis is acceptable for her.  ?We discussed the risk, benefits and alternatives to colonoscopy/endoscopy as well as the risk of her being off anticoagulation for the procedure and she is agreeable and wishes to proceed. ? ?Morbid obesity ?Continue ozempic, follows with weight loss center.  ?BMI 37 ? ?Patient Care Team: ?Linda Hedges, DO as PCP - General (Obstetrics and Gynecology) ?Constance Haw, MD as PCP - Cardiology (Cardiology) ? ?HISTORY OF PRESENT ILLNESS: ?61 y.o. female referred by Linda Hedges, DO, with a past medical history of hypertension, paroxysmal atrial fibrillation on Eliquis 5 mg twice daily follows with Dr. Lennie Odor, morbid obesity with metabolic syndrome on Ozempic, BMI is 37 and others listed below presents for evaluation of colonoscopy.  ?Has never had colonoscopy prior.  ? ?Patient follows with healthy weight and wellness center at [place] ?Patient denies GERD, dysphagia, nausea, vomiting, melena.  ?Patient denies change in bowel habits, constipation, diarrhea, hematochezia. Has BM daily.  ?Denies changes in appetite, unintentional weight loss.  ?No family history of GI malignancy. ? ? ?CBC  04/25/2020  ?HGB 14.2 MCV 89 without evidence of anemia ?WBC 5.3 Platelets 264 ?Kidney function 04/10/2021  ?BUN 14 Cr 0.91  ?GFR 81  ?Potassium 4.4   ?LFTs 04/10/2021  ?AST 19 ALT 15 ?Alkphos 73 TBili 0.5 ? ?Current Medications:  ? ?Current  Outpatient Medications (Endocrine & Metabolic):  ?  Semaglutide, 1 MG/DOSE, 4 MG/3ML SOPN, Inject 1 mg as directed once a week. ? ?Current Outpatient Medications (Cardiovascular):  ?  amLODipine-benazepril (LOTREL) 5-40 MG capsule, Take 1 capsule by mouth daily. ?  flecainide (TAMBOCOR) 50 MG tablet, TAKE 1 TABLET (50 MG TOTAL) BY MOUTH 2 (TWO) TIMES DAILY. ?  metoprolol tartrate (LOPRESSOR) 25 MG tablet, TAKE 1 TABLET (25 MG TOTAL) BY MOUTH 2 (TWO) TIMES DAILY. ? ? ? ?Current Outpatient Medications (Hematological):  ?  apixaban (ELIQUIS) 5 MG TABS tablet, Take 1 tablet (5 mg total) by mouth 2 (two) times daily. ? ?Current Outpatient Medications (Other):  ?  Semaglutide-Weight Management 1 MG/0.5ML SOAJ, Inject 1 mg into the skin once a week. ?  Vitamin D, Ergocalciferol, (DRISDOL) 1.25 MG (50000 UNIT) CAPS capsule, Take 1 capsule (50,000 Units total) by mouth every 7 (seven) days. ? ?Medical History:  ?Past Medical History:  ?Diagnosis Date  ? Dyspnea   ? GERD (gastroesophageal reflux disease)   ? Hypertension   ? Joint pain   ? Knee pain   ? Lower extremity edema   ? ?Allergies: No Known Allergies  ? ?Surgical History:  ?She  has a past surgical history that includes Cardioversion (N/A, 03/21/2018); Cardioversion (N/A, 03/31/2018); and Cardioversion (N/A, 10/06/2019). ?Family History:  ?Her family history includes Atrial fibrillation in her mother; Bladder Cancer in her father; Diabetes in her brother, father, and mother; Healthy in her sister; Heart attack in her brother; Heart disease in her father; Hypertension in her father and mother. ?Social History:  ? reports that she has never smoked. She has  never used smokeless tobacco. She reports current alcohol use of about 9.0 standard drinks per week. She reports that she does not use drugs. ? ?REVIEW OF SYSTEMS  : All other systems reviewed and negative except where noted in the History of Present Illness. ? ? ?PHYSICAL EXAM: ?BP 124/66   Ht '5\' 7"'$  (1.702 m)   Wt 245  lb (111.1 kg)   LMP 04/09/2016 (Approximate)   BMI 38.37 kg/m?  ?General:   Pleasant, well developed female in no acute distress ?Head:  Normocephalic and atraumatic. ?Eyes: sclerae anicteric,conjunctive pink  ?Heart:  irregular, irregular versus regular with many systolic beats ?Pulm: Clear anteriorly; no wheezing ?Abdomen:  Soft, Obese AB, skin exam normal, Normal bowel sounds.  no  tenderness . , . ?Extremities:  Without edema. ?Msk:  Symmetrical without gross deformities. Peripheral pulses intact.  ?Neurologic:  Alert and  oriented x4;  grossly normal neurologically. ?Skin:   Dry and intact without significant lesions or rashes. ?Psychiatric: Demonstrates good judgement and reason without abnormal affect or behaviors. ? ? ?Vladimir Crofts, PA-C ?8:37 AM ? ? ?

## 2021-04-23 ENCOUNTER — Telehealth (INDEPENDENT_AMBULATORY_CARE_PROVIDER_SITE_OTHER): Payer: Self-pay | Admitting: Adult Health

## 2021-04-23 ENCOUNTER — Other Ambulatory Visit (HOSPITAL_COMMUNITY): Payer: Self-pay

## 2021-04-23 ENCOUNTER — Encounter (INDEPENDENT_AMBULATORY_CARE_PROVIDER_SITE_OTHER): Payer: Self-pay

## 2021-04-23 NOTE — Telephone Encounter (Signed)
Prior authorization approved for [facility]. Effective: 04/23/2021 to 07/22/2021. Patient sent approval message via mychart.  ?

## 2021-05-01 ENCOUNTER — Encounter: Payer: Self-pay | Admitting: Physician Assistant

## 2021-05-01 ENCOUNTER — Other Ambulatory Visit (HOSPITAL_COMMUNITY): Payer: Self-pay

## 2021-05-01 ENCOUNTER — Telehealth: Payer: Self-pay

## 2021-05-01 ENCOUNTER — Ambulatory Visit (INDEPENDENT_AMBULATORY_CARE_PROVIDER_SITE_OTHER): Payer: Commercial Managed Care - PPO | Admitting: Physician Assistant

## 2021-05-01 VITALS — BP 124/66 | Ht 67.0 in | Wt 245.0 lb

## 2021-05-01 DIAGNOSIS — Z1211 Encounter for screening for malignant neoplasm of colon: Secondary | ICD-10-CM

## 2021-05-01 DIAGNOSIS — I480 Paroxysmal atrial fibrillation: Secondary | ICD-10-CM | POA: Diagnosis not present

## 2021-05-01 DIAGNOSIS — Z6841 Body Mass Index (BMI) 40.0 and over, adult: Secondary | ICD-10-CM | POA: Diagnosis not present

## 2021-05-01 NOTE — Telephone Encounter (Signed)
[facility] Pre-operative Risk Assessment  ?   ?Request for surgical clearance:     Endoscopy Procedure ? ?What type of surgery is being performed?     Colonoscopy ? ?When is this surgery scheduled?     06/20/2021 ? ?What type of clearance is required ?   Pharmacy ? ?Are there any medications that need to be held prior to surgery and how long? Eliquis ? ?Practice name and name of physician performing surgery?      [facility] ? ?What is your office phone and fax number?      Phone- [phone]  Fax- [phone] ? ?Anesthesia type (None, local, MAC, general) ?       MAC ? ?

## 2021-05-01 NOTE — Telephone Encounter (Signed)
[facility] Pre-operative Risk Assessment  ?   ?Request for surgical clearance:     Endoscopy Procedure ? ?What type of surgery is being performed?     Colonoscopy ? ?When is this surgery scheduled?     06/20/2021 ? ?What type of clearance is required ?   Pharmacy ? ?Are there any medications that need to be held prior to surgery and how long? Eliquis ? ?Practice name and name of physician performing surgery?      [facility] ? ?What is your office phone and fax number?      Phone- [phone]  Fax- [phone] ? ?Anesthesia type (None, local, MAC, general) ?       MAC ? ?

## 2021-05-01 NOTE — Progress Notes (Signed)
I agree with the assessment and plan as outlined by Ms. Collier. 

## 2021-05-01 NOTE — Telephone Encounter (Signed)
error 

## 2021-05-01 NOTE — Patient Instructions (Signed)
If you are age 61 or older, your body mass index should be between 23-30. Your Body mass index is 38.37 kg/m?Marland Kitchen If this is out of the aforementioned range listed, please consider follow up with your Primary Care Provider. ? ?If you are age 10 or younger, your body mass index should be between 19-25. Your Body mass index is 38.37 kg/m?Marland Kitchen If this is out of the aformentioned range listed, please consider follow up with your Primary Care Provider.  ? ?________________________________________________________ ? ?The [place] [facility] providers would like to encourage you to use [facility] to communicate with providers for non-urgent requests or questions.  Due to long hold times on the telephone, sending your provider a message by [facility] may be a faster and more efficient way to get a response.  Please allow 48 business hours for a response.  Please remember that this is for non-urgent requests.  ?_______________________________________________________ ? ?You have been scheduled for a colonoscopy. Please follow written instructions given to you at your visit today.  ?Please pick up your prep supplies at the pharmacy within the next 1-3 days. ?If you use inhalers (even only as needed), please bring them with you on the day of your procedure. ? ?

## 2021-05-02 NOTE — Telephone Encounter (Signed)
Patient with diagnosis of afib on Eliquis for anticoagulation.   ? ?Procedure: colonoscopy ?Date of procedure: 06/20/21 ? ?CHA2DS2-VASc Score = 2  ?This indicates a 2.2% annual risk of stroke. ?The patient's score is based upon: ?CHF History: 0 ?HTN History: 1 ?Diabetes History: 0 ?Stroke History: 0 ?Vascular Disease History: 0 ?Age Score: 0 ?Gender Score: 1 ?  ?CrCl 80m/min using adjusted body weight due to obesity ?Platelet count 264K ? ?Per office protocol, patient can hold Eliquis for 1-2 days prior to procedure.   ?

## 2021-05-02 NOTE — Telephone Encounter (Signed)
? ? ?  Patient Name: Tonya Nicholson  ?DOB: 21-May-1960 ?MRN: [id number] ? ?Primary Cardiologist: Will Meredith Leeds, MD ? ?Clinical pharmacist have reviewed past medical history, labs, and medications as part of preoperative protocol coverage.   ? ?The following recommendations have been made: ? ?Patient with diagnosis of afib on Eliquis for anticoagulation.   ?  ?Procedure: colonoscopy ?Date of procedure: 06/20/21 ?  ?CHA2DS2-VASc Score = 2  ?This indicates a 2.2% annual risk of stroke. ?The patient's score is based upon: ?CHF History: 0 ?HTN History: 1 ?Diabetes History: 0 ?Stroke History: 0 ?Vascular Disease History: 0 ?Age Score: 0 ?Gender Score: 1 ?  ?CrCl 39m/min using adjusted body weight due to obesity ?Platelet count 264K ?  ?Per office protocol, patient can hold Eliquis for 1-2 days prior to procedure.   ? ?Please resume Eliquis as soon as possible postprocedure, at the discretion of the surgeon. ? ?I will route this recommendation to the requesting party via Epic fax function and remove from pre-op pool. ? ?Please call with questions. ? ?ELenna Sciara NP ?05/02/2021, 9:25 AM ? ?

## 2021-05-09 ENCOUNTER — Other Ambulatory Visit (HOSPITAL_COMMUNITY): Payer: Self-pay

## 2021-05-13 ENCOUNTER — Telehealth: Payer: Self-pay

## 2021-05-13 NOTE — Telephone Encounter (Signed)
Spoke with patient and told her that per her doctor she could hold her Eliquis for 2 days prior to her procedure.  Patient agreed.  ?

## 2021-05-19 ENCOUNTER — Other Ambulatory Visit (HOSPITAL_COMMUNITY): Payer: Self-pay

## 2021-05-22 ENCOUNTER — Other Ambulatory Visit (INDEPENDENT_AMBULATORY_CARE_PROVIDER_SITE_OTHER): Payer: Self-pay | Admitting: Adult Health

## 2021-05-22 ENCOUNTER — Encounter (INDEPENDENT_AMBULATORY_CARE_PROVIDER_SITE_OTHER): Payer: Self-pay | Admitting: Adult Health

## 2021-05-22 ENCOUNTER — Ambulatory Visit (INDEPENDENT_AMBULATORY_CARE_PROVIDER_SITE_OTHER): Payer: 59 | Admitting: Adult Health

## 2021-05-22 ENCOUNTER — Other Ambulatory Visit (HOSPITAL_COMMUNITY): Payer: Self-pay

## 2021-05-22 VITALS — BP 94/52 | HR 77 | Temp 97.4°F | Ht 67.0 in | Wt 238.0 lb

## 2021-05-22 DIAGNOSIS — E669 Obesity, unspecified: Secondary | ICD-10-CM | POA: Diagnosis not present

## 2021-05-22 DIAGNOSIS — Z6837 Body mass index (BMI) 37.0-37.9, adult: Secondary | ICD-10-CM | POA: Diagnosis not present

## 2021-05-22 DIAGNOSIS — R7303 Prediabetes: Secondary | ICD-10-CM

## 2021-05-22 DIAGNOSIS — E559 Vitamin D deficiency, unspecified: Secondary | ICD-10-CM

## 2021-05-22 DIAGNOSIS — Z9189 Other specified personal risk factors, not elsewhere classified: Secondary | ICD-10-CM

## 2021-05-22 DIAGNOSIS — E66813 Obesity, class 3: Secondary | ICD-10-CM

## 2021-05-22 MED ORDER — SEMAGLUTIDE-WEIGHT MANAGEMENT 1 MG/0.5ML ~~LOC~~ SOAJ
1.0000 mg | SUBCUTANEOUS | 0 refills | Status: DC
  Filled 2021-05-22: qty 6, 84d supply, fill #0
  Filled 2021-08-07: qty 3, 42d supply, fill #1
  Filled 2021-08-08: qty 3, 28d supply, fill #1
  Filled 2021-08-11: qty 2, 28d supply, fill #1

## 2021-05-22 MED ORDER — VITAMIN D (ERGOCALCIFEROL) 1.25 MG (50000 UNIT) PO CAPS
50000.0000 [IU] | ORAL_CAPSULE | ORAL | 0 refills | Status: DC
  Filled 2021-05-22: qty 4, 28d supply, fill #0

## 2021-05-29 ENCOUNTER — Ambulatory Visit (INDEPENDENT_AMBULATORY_CARE_PROVIDER_SITE_OTHER): Payer: Commercial Managed Care - PPO | Admitting: Cardiology

## 2021-05-29 ENCOUNTER — Other Ambulatory Visit (HOSPITAL_COMMUNITY): Payer: Self-pay

## 2021-05-29 ENCOUNTER — Encounter: Payer: Self-pay | Admitting: Cardiology

## 2021-05-29 ENCOUNTER — Encounter: Payer: Self-pay | Admitting: *Deleted

## 2021-05-29 VITALS — BP 110/80 | HR 84 | Ht 67.0 in | Wt 244.8 lb

## 2021-05-29 DIAGNOSIS — R400 Somnolence: Secondary | ICD-10-CM

## 2021-05-29 DIAGNOSIS — I4819 Other persistent atrial fibrillation: Secondary | ICD-10-CM | POA: Diagnosis not present

## 2021-05-29 DIAGNOSIS — R0683 Snoring: Secondary | ICD-10-CM | POA: Diagnosis not present

## 2021-05-29 DIAGNOSIS — Z01812 Encounter for preprocedural laboratory examination: Secondary | ICD-10-CM

## 2021-05-29 MED ORDER — FLECAINIDE ACETATE 100 MG PO TABS
100.0000 mg | ORAL_TABLET | Freq: Two times a day (BID) | ORAL | 2 refills | Status: DC
  Filled 2021-05-29: qty 180, 90d supply, fill #0

## 2021-05-29 NOTE — Patient Instructions (Signed)
Medication Instructions:  ?Your physician has recommended you make the following change in your medication:  ?INCREASE Flecainide to 100 mg twice daily ? ?*If you need a refill on your cardiac medications before your next appointment, please call your pharmacy* ? ? ?Lab Work: ?Pre procedure labs on 3/31 at the [facility] office.  You do NOT need to be fasting. ? ?If you have labs (blood work) drawn today and your tests are completely normal, you will receive your results only by: ?MyChart Message (if you have MyChart) OR ?A paper copy in the mail ?If you have any lab test that is abnormal or we need to change your treatment, we will call you to review the results. ? ? ?Testing/Procedures: ?Your physician has recommended that you have a Cardioversion (DCCV). Electrical Cardioversion uses a jolt of electricity to your heart either through paddles or wired patches attached to your chest. This is a controlled, usually prescheduled, procedure. Defibrillation is done under light anesthesia in the hospital, and you usually go home the day of the procedure. This is done to get your heart back into a normal rhythm. You are not awake for the procedure. Please see the instruction sheet given to you today. ? ? ?Follow-Up: ?At [facility], you and your health needs are our priority.  As part of our continuing mission to provide you with exceptional heart care, we have created designated Provider Care Teams.  These Care Teams include your primary Cardiologist (physician) and Advanced Practice Providers (APPs -  Physician Assistants and Nurse Practitioners) who all work together to provide you with the care you need, when you need it. ? ?Your next appointment:   ?3 month(s) ? ?The format for your next appointment:   ?In Person ? ?Provider:  ?Allegra Lai, MD ? ? ?Thank you for choosing [facility]!! ? ? ?Trinidad Curet, RN ?([phone] ? ? ? ?Other Instructions ? ?Important Information About Sugar ? ? ? ? ? ? ?

## 2021-05-29 NOTE — Progress Notes (Signed)
? ?Electrophysiology Office Note ? ? ?Date:  05/29/2021  ? ?ID:  [name], DOB 05-Dec-1960, MRN [id number] ? ?PCP:  Linda Hedges, DO  ?Cardiologist:   ?Primary Electrophysiologist:  Anja Neuzil Meredith Leeds, MD   ? ?No chief complaint on file. ? ? ?  ?History of Present Illness: ?Tonya Nicholson is a 61 y.o. female who is being seen today for the evaluation of atrial fibrillation at the request of [name], [name], DO. Presenting today for electrophysiology evaluation.   ? ?She has a history significant for obesity and hypertension.  She went to her primary physician's office and was noted to be in atrial fibrillation.  She had symptoms of fatigue and shortness of breath.  She had a cardioversion but unfortunate went back into atrial fibrillation.  She was thus started on flecainide. ? ?Today, denies symptoms of palpitations, chest pain, shortness of breath, orthopnea, PND, lower extremity edema, claudication, dizziness, presyncope, syncope, bleeding, or neurologic sequela. The patient is tolerating medications without difficulties.  She is being worked up for colonoscopy and was noted to be in atrial fibrillation.  She has some generalized fatigue, but otherwise has no major complaints.  She like to get back into normal rhythm. ? ? ?Past Medical History:  ?Diagnosis Date  ? Dyspnea   ? GERD (gastroesophageal reflux disease)   ? Hypertension   ? Joint pain   ? Knee pain   ? Lower extremity edema   ? ?Past Surgical History:  ?Procedure Laterality Date  ? CARDIOVERSION N/A 03/21/2018  ? Procedure: CARDIOVERSION;  Surgeon: Tonya Margarita, MD;  Location: [facility];  Service: Cardiovascular;  Laterality: N/A;  ? CARDIOVERSION N/A 03/31/2018  ? Procedure: CARDIOVERSION;  Surgeon: Tonya Haw, MD;  Location: [place];  Service: Cardiovascular;  Laterality: N/A;  ? CARDIOVERSION N/A 10/06/2019  ? Procedure: CARDIOVERSION;  Surgeon: Tonya Haw, MD;  Location: [place];  Service: Cardiovascular;   Laterality: N/A;  ? ? ? ?Current Outpatient Medications  ?Medication Sig Dispense Refill  ? amLODipine-benazepril (LOTREL) 5-40 MG capsule Take 1 capsule by mouth daily. 90 capsule 0  ? apixaban (ELIQUIS) 5 MG TABS tablet Take 1 tablet (5 mg total) by mouth 2 (two) times daily. 180 tablet 1  ? flecainide (TAMBOCOR) 100 MG tablet Take 1 tablet (100 mg total) by mouth 2 (two) times daily. 180 tablet 2  ? metoprolol tartrate (LOPRESSOR) 25 MG tablet TAKE 1 TABLET (25 MG TOTAL) BY MOUTH 2 (TWO) TIMES DAILY. 180 tablet 1  ? Semaglutide-Weight Management 1 MG/0.5ML SOAJ Inject 1 mg into the skin once a week. 9 mL 0  ? Vitamin D, Ergocalciferol, (DRISDOL) 1.25 MG (50000 UNIT) CAPS capsule Take 1 capsule (50,000 Units total) by mouth every 7 (seven) days. 4 capsule 0  ? ?No current facility-administered medications for this visit.  ? ? ?Allergies:   Patient has no known allergies.  ? ?Social History:  The patient  reports that she has never smoked. She has never used smokeless tobacco. She reports current alcohol use of about 9.0 standard drinks per week. She reports that she does not use drugs.  ? ?Family History:  The patient's family history includes Atrial fibrillation in her mother; Bladder Cancer in her father; Diabetes in her brother, father, and mother; Healthy in her sister; Heart attack in her brother; Heart disease in her father; Hypertension in her father and mother.  ? ?ROS:  Please see the history of present illness.   Otherwise, review of systems is  positive for none.   All other systems are reviewed and negative.  ? ?PHYSICAL EXAM: ?VS:  BP 110/80   Pulse 84   Ht '5\' 7"'$  (1.702 m)   Wt 244 lb 12.8 oz (111 kg)   LMP 04/09/2016 (Approximate)   SpO2 96%   BMI 38.34 kg/m?  , BMI Body mass index is 38.34 kg/m?. ?GEN: Well nourished, well developed, in no acute distress  ?HEENT: normal  ?Neck: no JVD, carotid bruits, or masses ?Cardiac: Irregular; no murmurs, rubs, or gallops,no edema  ?Respiratory:  clear to  auscultation bilaterally, normal work of breathing ?GI: soft, nontender, nondistended, + BS ?MS: no deformity or atrophy  ?Skin: warm and dry ?Neuro:  Strength and sensation are intact ?Psych: euthymic mood, full affect ? ?EKG:  EKG is ordered today. ?Personal review of the ekg ordered shows atrial fibrillation ? ?Recent Labs: ?04/10/2021: ALT 15; BUN 14; Creatinine, Ser 0.91; Potassium 4.4; Sodium 141  ? ? ?Lipid Panel  ?   ?Component Value Date/Time  ? CHOL 166 04/25/2020 1001  ? TRIG 71 04/25/2020 1001  ? HDL 54 04/25/2020 1001  ? CHOLHDL 3.1 04/25/2020 1001  ? [place] 98 04/25/2020 1001  ? ? ? ?Wt Readings from Last 3 Encounters:  ?05/29/21 244 lb 12.8 oz (111 kg)  ?05/22/21 238 lb (108 kg)  ?05/01/21 245 lb (111.1 kg)  ?  ? ? ?Other studies Reviewed: ?Additional studies/ records that were reviewed today include: TTE 03/04/2018 ? 1. The left ventricle has normal systolic function of 29-47%. The cavity  ?size was normal. There is no increased left ventricular wall thickness.  ?Left ventricular diastology could not be evaluated secondary to atrial  ?fibrillation.  ? 2. The right ventricle has normal systolic function. The cavity was  ?normal. There there are no ECGs that show atrial fibrillation no increase in right ventricular wall thickness.  ? 3. Left atrial size was mildly dilated.  ? 4. Normal RVSP  ? 5. The inferior vena cava was dilated in size with <50% respiratory  ?variability.  ? ?ASSESSMENT AND PLAN: ? ?1.  Persistent atrial fibrillation: Currently on Eliquis 5 mg twice daily, flecainide 50 mg twice daily, metoprolol 25 mg twice daily.  High risk medication monitoring today for flecainide.  CHA2DS2-VASc of 2.  She unfortunately is gone back into atrial fibrillation.  She would like to avoid procedures at this time.  Due to that, we Tonya Nicholson plan for increase in her flecainide to 100 mg twice daily.  We Tonya Nicholson set her up for cardioversion. ? ?2.  Hypertension: Currently well controlled ? ?3.  Snoring/daytime  somnolence: We Tonya Nicholson plan for sleep study ? ?4.  Secondary hypercoagulable state: Currently on Eliquis for atrial fibrillation as above ? ?Current medicines are reviewed at length with the patient today.   ?The patient does not have concerns regarding her medicines.  The following changes were made today: None ? ?Labs/ tests ordered today include:  ?Orders Placed This Encounter  ?Procedures  ? Basic metabolic panel  ? CBC  ? EKG 12-Lead  ? [place] Sleep Study  ? ? ? ?Disposition:   FU with Anntoinette Haefele 3 months ? ?Signed, ?Aleyssa Pike Meredith Leeds, MD  ?05/29/2021 9:43 AM    ? ?[facility] ?[address] ?[address] ?[place] [address] ?[phone] (office) ?[phone] (fax) ?

## 2021-05-30 ENCOUNTER — Other Ambulatory Visit (HOSPITAL_COMMUNITY): Payer: Self-pay

## 2021-05-30 ENCOUNTER — Encounter: Payer: Self-pay | Admitting: *Deleted

## 2021-06-03 NOTE — Progress Notes (Signed)
? ? ? ?Chief Complaint:  ? ?OBESITY ?Tonya Nicholson is here to discuss her progress with her obesity treatment plan along with follow-up of her obesity related diagnoses. Ardythe is on the Category 2 Plan and states she is following her eating plan approximately 80% of the time. Fantasy states she is doing gym exercise for 30-45 minutes 4-5 times per week. ? ?Today's visit was #: 42 ?Starting weight: 263 lbs ?Starting date: 02/24/2018 ?Today's weight: 238 lbs ?Today's date: 05/22/2021 ?Total lbs lost to date: 25 lbs ?Total lbs lost since last in-office visit: 4 lbs ? ?Interim History:  ?Lataysha has resumed regular exercise and states "it's a religious event to go the gym".  ?She is exercising 4-5 times per week.  ? ?Subjective:  ? ?1. Vitamin D deficiency ?Bhavika's Vitamin D level on 03/31/2021 was 58.6 stable.  ?She is currently on Ergocalciferol-denies nausea, vomiting, and muscle weakness.  ? ?2. Prediabetes ?04/10/2021 blood glucose was 87. Her A1C was 5.3. Her insulin level was 12.8.  ?All levels has improved.  ?04/10/2021 CMP kidney fx stable.  ? ?3. At risk for nausea ?Seniyah is at risk for nausea due to [facility] for obesity tx. ? ?Assessment/Plan:  ? ?1. Vitamin D deficiency ?Low Vitamin D level contributes to fatigue and are associated with obesity, breast, and colon cancer. We will refill prescription Vitamin D 50,000 IU every week for 1 month with no refills and Agatha will follow-up for routine testing of Vitamin D, at least 2-3 times per year to avoid over-replacement. We discussed labs today.  ? ?- Vitamin D, Ergocalciferol, (DRISDOL) 1.25 MG (50000 UNIT) CAPS capsule; Take 1 capsule (50,000 Units total) by mouth every 7 (seven) days.  Dispense: 4 capsule; Refill: 0 ? ?2. Prediabetes ?Raniah will continue to work on weight loss, exercise, and decreasing simple carbohydrates to help decrease the risk of diabetes. We will monitor labs. We discussed labs today. She will continue Category 2 with meal plan and regular exercise.   ? ?3. At risk for nausea ?AQUARIUS TREMPER was given approximately 15 minutes of nausea prevention counseling today. Kyah is at risk for nausea due to her new or current medication. She was encouraged to titrate her medication slowly, make sure to stay hydrated, eat smaller portions throughout the day, and avoid high fat meals.   ? ?4. Obesity with current BMI 37.3 ?Quincey is currently in the action stage of change. As such, her goal is to continue with weight loss efforts. She has agreed to the Category 2 Plan.  ? ?We will refill Wegovy 1 mg for 1 month with no refills.  ? ?- Semaglutide-Weight Management 1 MG/0.5ML SOAJ; Inject 1 mg into the skin once a week.  Dispense: 9 mL; Refill: 0 ? ?Exercise goals:  As is. ? ?Behavioral modification strategies: increasing lean protein intake, decreasing simple carbohydrates, meal planning and cooking strategies, keeping healthy foods in the home, and planning for success. ? ?Traniece has agreed to follow-up with our clinic in 4 weeks. She was informed of the importance of frequent follow-up visits to maximize her success with intensive lifestyle modifications for her multiple health conditions.  ? ?Objective:  ? ?Blood pressure (!) 94/52, pulse 77, temperature (!) 97.4 ?F (36.3 ?C), height '5\' 7"'$  (1.702 m), weight 238 lb (108 kg), last menstrual period 04/09/2016, SpO2 99 %. ?Body mass index is 37.28 kg/m?. ? ?General: Cooperative, alert, well developed, in no acute distress. ?HEENT: Conjunctivae and lids unremarkable. ?Cardiovascular: Regular rhythm.  ?Lungs: Normal work of  breathing. ?Neurologic: No focal deficits.  ? ?Lab Results  ?Component Value Date  ? CREATININE 0.91 04/10/2021  ? BUN 14 04/10/2021  ? NA 141 04/10/2021  ? K 4.4 04/10/2021  ? CL 104 04/10/2021  ? CO2 22 04/10/2021  ? ?Lab Results  ?Component Value Date  ? ALT 15 04/10/2021  ? AST 19 04/10/2021  ? ALKPHOS 73 04/10/2021  ? BILITOT 0.5 04/10/2021  ? ?Lab Results  ?Component Value Date  ? HGBA1C 5.3 04/10/2021   ? HGBA1C 6.0 (H) 09/26/2020  ? HGBA1C 6.0 (H) 04/25/2020  ? HGBA1C 5.7 (H) 06/29/2019  ? HGBA1C 5.7 (H) 01/31/2019  ? ?Lab Results  ?Component Value Date  ? INSULIN 12.8 04/10/2021  ? INSULIN 11.0 09/26/2020  ? INSULIN 9.4 04/25/2020  ? INSULIN 14.5 06/29/2019  ? INSULIN 8.2 01/31/2019  ? ?Lab Results  ?Component Value Date  ? TSH 1.410 02/24/2018  ? ?Lab Results  ?Component Value Date  ? CHOL 166 04/25/2020  ? HDL 54 04/25/2020  ? [place] 98 04/25/2020  ? TRIG 71 04/25/2020  ? CHOLHDL 3.1 04/25/2020  ? ?Lab Results  ?Component Value Date  ? VD25OH 58.6 04/10/2021  ? VD25OH 37.6 09/26/2020  ? VD25OH 34.1 04/25/2020  ? ?Lab Results  ?Component Value Date  ? WBC 5.3 04/25/2020  ? HGB 14.2 04/25/2020  ? HCT 42.3 04/25/2020  ? MCV 89 04/25/2020  ? PLT 264 04/25/2020  ? ?No results found for: IRON, TIBC, FERRITIN ? ?Attestation Statements:  ? ?Reviewed by clinician on day of visit: allergies, medications, problem list, medical history, surgical history, family history, social history, and previous encounter notes. ? ?I, Lizbeth Bark, RMA, am acting as Location manager for Mina Marble, NP. ? ?I have reviewed the above documentation for accuracy and completeness, and I agree with the above. -  Ayiden Milliman d. Rosealynn Mateus, NP-C ?

## 2021-06-09 ENCOUNTER — Encounter (HOSPITAL_COMMUNITY): Payer: Self-pay | Admitting: Cardiology

## 2021-06-17 ENCOUNTER — Emergency Department (HOSPITAL_BASED_OUTPATIENT_CLINIC_OR_DEPARTMENT_OTHER): Payer: 59

## 2021-06-17 ENCOUNTER — Encounter (HOSPITAL_BASED_OUTPATIENT_CLINIC_OR_DEPARTMENT_OTHER): Payer: Self-pay

## 2021-06-17 ENCOUNTER — Inpatient Hospital Stay (HOSPITAL_BASED_OUTPATIENT_CLINIC_OR_DEPARTMENT_OTHER)
Admission: EM | Admit: 2021-06-17 | Discharge: 2021-06-20 | DRG: 418 | Disposition: A | Discharge status: Home / Self Care | Payer: 59 | Attending: Internal Medicine | Admitting: Internal Medicine

## 2021-06-17 ENCOUNTER — Other Ambulatory Visit: Payer: Self-pay

## 2021-06-17 DIAGNOSIS — K819 Cholecystitis, unspecified: Principal | ICD-10-CM

## 2021-06-17 DIAGNOSIS — I4821 Permanent atrial fibrillation: Secondary | ICD-10-CM | POA: Diagnosis not present

## 2021-06-17 DIAGNOSIS — Z8249 Family history of ischemic heart disease and other diseases of the circulatory system: Secondary | ICD-10-CM

## 2021-06-17 DIAGNOSIS — K8012 Calculus of gallbladder with acute and chronic cholecystitis without obstruction: Principal | ICD-10-CM | POA: Diagnosis present

## 2021-06-17 DIAGNOSIS — R110 Nausea: Secondary | ICD-10-CM | POA: Diagnosis not present

## 2021-06-17 DIAGNOSIS — Z833 Family history of diabetes mellitus: Secondary | ICD-10-CM

## 2021-06-17 DIAGNOSIS — Z79899 Other long term (current) drug therapy: Secondary | ICD-10-CM

## 2021-06-17 DIAGNOSIS — Z6838 Body mass index (BMI) 38.0-38.9, adult: Secondary | ICD-10-CM | POA: Diagnosis not present

## 2021-06-17 DIAGNOSIS — E1165 Type 2 diabetes mellitus with hyperglycemia: Secondary | ICD-10-CM | POA: Diagnosis present

## 2021-06-17 DIAGNOSIS — E669 Obesity, unspecified: Secondary | ICD-10-CM | POA: Diagnosis not present

## 2021-06-17 DIAGNOSIS — I482 Chronic atrial fibrillation, unspecified: Secondary | ICD-10-CM | POA: Diagnosis present

## 2021-06-17 DIAGNOSIS — R7303 Prediabetes: Secondary | ICD-10-CM | POA: Diagnosis present

## 2021-06-17 DIAGNOSIS — K8066 Calculus of gallbladder and bile duct with acute and chronic cholecystitis without obstruction: Secondary | ICD-10-CM | POA: Diagnosis not present

## 2021-06-17 DIAGNOSIS — K810 Acute cholecystitis: Secondary | ICD-10-CM

## 2021-06-17 DIAGNOSIS — Z01810 Encounter for preprocedural cardiovascular examination: Secondary | ICD-10-CM | POA: Diagnosis not present

## 2021-06-17 DIAGNOSIS — Z8052 Family history of malignant neoplasm of bladder: Secondary | ICD-10-CM | POA: Diagnosis not present

## 2021-06-17 DIAGNOSIS — R1011 Right upper quadrant pain: Secondary | ICD-10-CM | POA: Diagnosis not present

## 2021-06-17 DIAGNOSIS — K8020 Calculus of gallbladder without cholecystitis without obstruction: Secondary | ICD-10-CM | POA: Diagnosis not present

## 2021-06-17 DIAGNOSIS — Z7901 Long term (current) use of anticoagulants: Secondary | ICD-10-CM | POA: Diagnosis not present

## 2021-06-17 DIAGNOSIS — K219 Gastro-esophageal reflux disease without esophagitis: Secondary | ICD-10-CM | POA: Diagnosis present

## 2021-06-17 DIAGNOSIS — R140 Abdominal distension (gaseous): Secondary | ICD-10-CM | POA: Diagnosis not present

## 2021-06-17 DIAGNOSIS — I10 Essential (primary) hypertension: Secondary | ICD-10-CM | POA: Diagnosis present

## 2021-06-17 DIAGNOSIS — R9431 Abnormal electrocardiogram [ECG] [EKG]: Secondary | ICD-10-CM | POA: Diagnosis not present

## 2021-06-17 DIAGNOSIS — I4819 Other persistent atrial fibrillation: Secondary | ICD-10-CM | POA: Diagnosis not present

## 2021-06-17 LAB — URINALYSIS, ROUTINE W REFLEX MICROSCOPIC
Bilirubin Urine: NEGATIVE
Glucose, UA: NEGATIVE mg/dL
Hgb urine dipstick: NEGATIVE
Ketones, ur: NEGATIVE mg/dL
Leukocytes,Ua: NEGATIVE
Nitrite: NEGATIVE
Specific Gravity, Urine: 1.033 — ABNORMAL HIGH (ref 1.005–1.030)
pH: 5 (ref 5.0–8.0)

## 2021-06-17 LAB — CBC
HCT: 44 % (ref 36.0–46.0)
Hemoglobin: 14.5 g/dL (ref 12.0–15.0)
MCH: 29.4 pg (ref 26.0–34.0)
MCHC: 33 g/dL (ref 30.0–36.0)
MCV: 89.2 fL (ref 80.0–100.0)
Platelets: 274 10*3/uL (ref 150–400)
RBC: 4.93 MIL/uL (ref 3.87–5.11)
RDW: 12.2 % (ref 11.5–15.5)
WBC: 9.3 10*3/uL (ref 4.0–10.5)
nRBC: 0 % (ref 0.0–0.2)

## 2021-06-17 LAB — COMPREHENSIVE METABOLIC PANEL
ALT: 19 U/L (ref 0–44)
AST: 15 U/L (ref 15–41)
Albumin: 4.3 g/dL (ref 3.5–5.0)
Alkaline Phosphatase: 58 U/L (ref 38–126)
Anion gap: 11 (ref 5–15)
BUN: 27 mg/dL — ABNORMAL HIGH (ref 8–23)
CO2: 26 mmol/L (ref 22–32)
Calcium: 9.4 mg/dL (ref 8.9–10.3)
Chloride: 100 mmol/L (ref 98–111)
Creatinine, Ser: 1.02 mg/dL — ABNORMAL HIGH (ref 0.44–1.00)
GFR, Estimated: >60 mL/min (ref >60)
Glucose, Bld: 132 mg/dL — ABNORMAL HIGH (ref 70–99)
Potassium: 3.9 mmol/L (ref 3.5–5.1)
Sodium: 137 mmol/L (ref 135–145)
Total Bilirubin: 0.5 mg/dL (ref 0.3–1.2)
Total Protein: 7.6 g/dL (ref 6.5–8.1)

## 2021-06-17 LAB — LIPASE, BLOOD: Lipase: 37 U/L (ref 11–51)

## 2021-06-17 MED ORDER — MORPHINE SULFATE (PF) 4 MG/ML IV SOLN
4.0000 mg | Freq: Once | INTRAVENOUS | Status: AC
  Administered 2021-06-17: 4 mg via INTRAVENOUS
  Filled 2021-06-17: qty 1

## 2021-06-17 MED ORDER — METRONIDAZOLE 500 MG/100ML IV SOLN
500.0000 mg | Freq: Once | INTRAVENOUS | Status: AC
  Administered 2021-06-17: 500 mg via INTRAVENOUS
  Filled 2021-06-17: qty 100

## 2021-06-17 MED ORDER — DIPHENHYDRAMINE HCL 25 MG PO CAPS
25.0000 mg | ORAL_CAPSULE | Freq: Once | ORAL | Status: AC
  Administered 2021-06-17: 25 mg via ORAL
  Filled 2021-06-17: qty 1

## 2021-06-17 MED ORDER — ONDANSETRON HCL 4 MG/2ML IJ SOLN
4.0000 mg | Freq: Once | INTRAMUSCULAR | Status: AC
  Administered 2021-06-17: 4 mg via INTRAVENOUS
  Filled 2021-06-17: qty 2

## 2021-06-17 MED ORDER — SODIUM CHLORIDE 0.9 % IV SOLN
2.0000 g | Freq: Once | INTRAVENOUS | Status: AC
  Administered 2021-06-17: 2 g via INTRAVENOUS
  Filled 2021-06-17: qty 20

## 2021-06-17 MED ORDER — METOCLOPRAMIDE HCL 5 MG/ML IJ SOLN
10.0000 mg | Freq: Once | INTRAMUSCULAR | Status: AC
Start: 2021-06-17 — End: 2021-06-17
  Administered 2021-06-17: 10 mg via INTRAVENOUS
  Filled 2021-06-17: qty 2

## 2021-06-17 MED ORDER — ACETAMINOPHEN 500 MG PO TABS
1000.0000 mg | ORAL_TABLET | Freq: Once | ORAL | Status: AC
  Administered 2021-06-17: 1000 mg via ORAL
  Filled 2021-06-17: qty 2

## 2021-06-17 NOTE — ED Triage Notes (Signed)
Patient here POV from Home.  Endorses ABD Pain since 1300 today. Located in Upper ABD and radiates to Back.  Moderate Nausea and 3 episodes of Emesis 1 Hour PTA. No Known Fevers. No Changes in Urination or BM.  NAD Noted during Triage. A&Ox4. Gcs 15. Ambulatory.

## 2021-06-17 NOTE — ED Provider Notes (Signed)
[facility] DE[facility] Provider Note   CSN: [id number] Arrival date & time: 06/17/21  1857     History  Chief Complaint  Patient presents with   Abdominal Pain    Tonya Nicholson is a 61 y.o. female.   Abdominal Pain  Patient is a 61 year old female with a past medical history significant for hypertension, reflux, knee pain, A-fib on Eliquis and flecainide and metoprolol  Patient is presented emergency room today with complaints of nausea vomiting abdominal pain seems to be upper abdomen and seems to radiate to her back.  Is been ongoing since between noon and 1 PM today.  Has had 3 episodes of nonbloody nonbilious emesis.  No diarrhea or constipation.  No blood in stool.  No urinary frequency urgency dysuria hematuria.  She had an episode of pain similar to this 1 month ago that lasted overnight      Home Medications Prior to Admission medications   Medication Sig Start Date End Date Taking? Authorizing Provider  amLODipine-benazepril (LOTREL) 5-40 MG capsule Take 1 capsule by mouth daily. 03/24/21     apixaban (ELIQUIS) 5 MG TABS tablet Take 1 tablet (5 mg total) by mouth 2 (two) times daily. 10/31/20   Camnitz, Ocie Doyne, MD  flecainide (TAMBOCOR) 100 MG tablet Take 1 tablet (100 mg total) by mouth 2 (two) times daily. 05/29/21   Camnitz, Will Hassell Done, MD  metoprolol tartrate (LOPRESSOR) 25 MG tablet TAKE 1 TABLET (25 MG TOTAL) BY MOUTH 2 (TWO) TIMES DAILY. 03/21/21   Camnitz, Ocie Doyne, MD  Semaglutide-Weight Management 1 MG/0.5ML SOAJ Inject 1 mg into the skin once a week. 05/22/21   Danford, Valetta Fuller D, NP  Vitamin D, Ergocalciferol, (DRISDOL) 1.25 MG (50000 UNIT) CAPS capsule Take 1 capsule (50,000 Units total) by mouth every 7 (seven) days. 05/22/21   Esaw Grandchild, NP      Allergies    Patient has no known allergies.    Review of Systems   Review of Systems  Gastrointestinal:  Positive for abdominal pain.   Physical Exam Updated Vital Signs BP (!)  131/91   Pulse 86   Temp 97.8 F (36.6 C) (Oral)   Resp 15   Ht '5\' 7"'$  (1.702 m)   Wt 111 kg   LMP 04/09/2016 (Approximate)   SpO2 99%   BMI 38.33 kg/m  Physical Exam Vitals and nursing note reviewed.  Constitutional:      General: She is not in acute distress. HENT:     Head: Normocephalic and atraumatic.     Nose: Nose normal.  Eyes:     General: No scleral icterus. Cardiovascular:     Rate and Rhythm: Normal rate and regular rhythm.     Pulses: Normal pulses.     Heart sounds: Normal heart sounds.  Pulmonary:     Effort: Pulmonary effort is normal. No respiratory distress.     Breath sounds: No wheezing.  Abdominal:     Palpations: Abdomen is soft.     Tenderness: There is abdominal tenderness in the right upper quadrant.  Musculoskeletal:     Cervical back: Normal range of motion.     Right lower leg: No edema.     Left lower leg: No edema.  Skin:    General: Skin is warm and dry.     Capillary Refill: Capillary refill takes less than 2 seconds.  Neurological:     Mental Status: She is alert. Mental status is at baseline.  Psychiatric:  Mood and Affect: Mood normal.        Behavior: Behavior normal.    ED Results / Procedures / Treatments   Labs (all labs ordered are listed, but only abnormal results are displayed) Labs Reviewed  COMPREHENSIVE METABOLIC PANEL - Abnormal; Notable for the following components:      Result Value   Glucose, Bld 132 (*)    BUN 27 (*)    Creatinine, Ser 1.02 (*)    All other components within normal limits  URINALYSIS, ROUTINE W REFLEX MICROSCOPIC - Abnormal; Notable for the following components:   APPearance HAZY (*)    Specific Gravity, Urine 1.033 (*)    Protein, ur TRACE (*)    Bacteria, UA RARE (*)    All other components within normal limits  LIPASE, BLOOD  CBC    EKG None  Radiology US Abdomen Limited RUQ (LIVER/GB)  Result Date: 06/17/2021 CLINICAL DATA:  [id number].  Right upper quadrant abdominal pain,  nausea EXAM: ULTRASOUND ABDOMEN LIMITED RIGHT UPPER QUADRANT COMPARISON:  None Available. FINDINGS: Gallbladder: An 18 mm gallstone is seen impacted within the gallbladder neck and the gallbladder is mildly distended. However, there is no gallbladder wall thickening, and no pericholecystic fluid is identified. The sonographic Percell Miller sign is reportedly negative. Common bile duct: Diameter: 4 mm in proximal diameter Liver: No focal lesion identified. Within normal limits in parenchymal echogenicity. Portal vein is patent on color Doppler imaging with normal direction of blood flow towards the liver. Other: None. IMPRESSION: Cholelithiasis with 18 mm gallstone impacted within the gallbladder neck. Mild associated gallbladder distension. The findings can be seen in early changes of acute cholecystitis and correlation with liver enzymes may be helpful. Electronically Signed   By: Fidela Salisbury M.D.   On: 06/17/2021 20:40    Procedures Procedures    Medications Ordered in ED Medications  cefTRIAXone (ROCEPHIN) 2 g in sodium chloride 0.9 % 100 mL IVPB (has no administration in time range)  metroNIDAZOLE (FLAGYL) IVPB 500 mg (has no administration in time range)  metoCLOPramide (REGLAN) injection 10 mg (10 mg Intravenous Given 06/17/21 2006)  diphenhydrAMINE (BENADRYL) capsule 25 mg (25 mg Oral Given 06/17/21 2003)  morphine (PF) 4 MG/ML injection 4 mg (4 mg Intravenous Given 06/17/21 2007)  acetaminophen (TYLENOL) tablet 1,000 mg (1,000 mg Oral Given 06/17/21 2002)  morphine (PF) 4 MG/ML injection 4 mg (4 mg Intravenous Given 06/17/21 2144)    ED Course/ Medical Decision Making/ A&P Clinical Course as of 06/17/21 2231  Tue Jun 17, 2021  2120 IMPRESSION: Cholelithiasis with 18 mm gallstone impacted within the gallbladder neck. Mild associated gallbladder distension. The findings can be seen in early changes of acute cholecystitis and correlation with liver enzymes may be helpful.   [WF]  2145  Discussed with Dr. Barry Dienes  [WF]    Clinical Course User Index [WF] Tedd Sias, PA                           Medical Decision Making Amount and/or Complexity of Data Reviewed Labs: ordered. Radiology: ordered.  Risk OTC drugs. Prescription drug management. Decision regarding hospitalization.   This patient presents to the ED for concern of abdominal pain, nausea, vomiting, this involves a number of treatment options, and is a complaint that carries with it a high risk of complications and morbidity.  The differential diagnosis includes The causes of generalized abdominal pain include but are not limited to AAA, mesenteric ischemia, appendicitis,  diverticulitis, DKA, gastritis, gastroenteritis, AMI, nephrolithiasis, pancreatitis, peritonitis, adrenal insufficiency,lead poisoning, iron toxicity, intestinal ischemia, constipation, UTI,SBO/LBO, splenic rupture, biliary disease, IBD, IBS, PUD, or hepatitis. Ectopic pregnancy, ovarian torsion, PID.    Co morbidities: Discussed in HPI   Brief History:  Patient is a 61 year old female with a past medical history significant for hypertension, reflux, knee pain, A-fib on Eliquis and flecainide and metoprolol  Patient is presented emergency room today with complaints of nausea vomiting abdominal pain seems to be upper abdomen and seems to radiate to her back.  Is been ongoing since between noon and 1 PM today.  Has had 3 episodes of nonbloody nonbilious emesis.  No diarrhea or constipation.  No blood in stool.  No urinary frequency urgency dysuria hematuria.  She had an episode of pain similar to this 1 month ago that lasted overnight  Tender right upper quadrant based on history and physical exam with concern for Coley cystitis, choledocholithiasis versus cholelithiasis    EMR reviewed including pt PMHx, past surgical history and past visits to ER.   See HPI for more details   Lab Tests:   I personally reviewed all laboratory  work and imaging. Metabolic panel without any acute abnormality specifically kidney function within normal limits and no significant electrolyte abnormalities. CBC without leukocytosis or significant anemia.  Urinalysis unremarkable lipase within normal limits.   Imaging Studies:  Abnormal findings. I personally reviewed all imaging studies. Imaging notable for 18 mm gallstone impacted within the gallbladder neck.   Cardiac Monitoring:  The patient was maintained on a cardiac monitor.  I personally viewed and interpreted the cardiac monitored which showed an underlying rhythm of: A-fib EKG non-ischemic atrial fibrillation   Medicines ordered:  I ordered medication including morphine x2, Reglan, Benadryl, Tylenol.  Flagyl added on at Dr. Juel Burrow request for cholecystitis Reevaluation of the patient after these medicines showed that the patient improved I have reviewed the patients home medicines and have made adjustments as needed   Critical Interventions:     Consults/Attending Physician   I discussed this case with my attending physician who cosigned this note including patient's presenting symptoms, physical exam, and planned diagnostics and interventions. Attending physician stated agreement with plan or made changes to plan which were implemented.  I discussed with Dr. Barry Dienes of general surgery who recommends admission to medicine and initiation of antibiotics for cholecystitis.  I discussed with Dr. Nevada Crane of hospitalist service who will admit patient to [facility].   Reevaluation:  After the interventions noted above I re-evaluated patient and found that they have :improved   Social Determinants of Health:      Problem List / ED Course:  Chole lithiasis in the setting of persistent right upper quadrant pain even after several doses of morphine.  After surgical consultation recommendation is for admission.  Is currently on Eliquis will require Eliquis washout.  Will  defer to hospitalist service for this.   Dispostion:  After consideration of the diagnostic results and the patients response to treatment, I feel that the patent would benefit from admission with IV antibiotics and surgery consultation.   Final Clinical Impression(s) / ED Diagnoses Final diagnoses:  Cholecystitis    Rx / DC Orders ED Discharge Orders     None         Tedd Sias, [place] 32/67/12 4580    Campbell Stall P, DO 99/83/38 2346

## 2021-06-18 ENCOUNTER — Encounter (HOSPITAL_COMMUNITY): Payer: Self-pay | Admitting: Internal Medicine

## 2021-06-18 DIAGNOSIS — Z8052 Family history of malignant neoplasm of bladder: Secondary | ICD-10-CM | POA: Diagnosis not present

## 2021-06-18 DIAGNOSIS — K810 Acute cholecystitis: Secondary | ICD-10-CM | POA: Diagnosis not present

## 2021-06-18 DIAGNOSIS — R7303 Prediabetes: Secondary | ICD-10-CM | POA: Diagnosis not present

## 2021-06-18 DIAGNOSIS — K8012 Calculus of gallbladder with acute and chronic cholecystitis without obstruction: Secondary | ICD-10-CM | POA: Diagnosis not present

## 2021-06-18 DIAGNOSIS — E1165 Type 2 diabetes mellitus with hyperglycemia: Secondary | ICD-10-CM | POA: Diagnosis present

## 2021-06-18 DIAGNOSIS — I482 Chronic atrial fibrillation, unspecified: Secondary | ICD-10-CM

## 2021-06-18 DIAGNOSIS — I10 Essential (primary) hypertension: Secondary | ICD-10-CM

## 2021-06-18 DIAGNOSIS — Z01810 Encounter for preprocedural cardiovascular examination: Secondary | ICD-10-CM

## 2021-06-18 DIAGNOSIS — I4821 Permanent atrial fibrillation: Secondary | ICD-10-CM | POA: Diagnosis present

## 2021-06-18 DIAGNOSIS — I4819 Other persistent atrial fibrillation: Secondary | ICD-10-CM

## 2021-06-18 DIAGNOSIS — Z8249 Family history of ischemic heart disease and other diseases of the circulatory system: Secondary | ICD-10-CM | POA: Diagnosis not present

## 2021-06-18 DIAGNOSIS — K219 Gastro-esophageal reflux disease without esophagitis: Secondary | ICD-10-CM | POA: Diagnosis not present

## 2021-06-18 DIAGNOSIS — Z79899 Other long term (current) drug therapy: Secondary | ICD-10-CM | POA: Diagnosis not present

## 2021-06-18 DIAGNOSIS — Z7901 Long term (current) use of anticoagulants: Secondary | ICD-10-CM | POA: Diagnosis not present

## 2021-06-18 DIAGNOSIS — Z833 Family history of diabetes mellitus: Secondary | ICD-10-CM | POA: Diagnosis not present

## 2021-06-18 DIAGNOSIS — Z6838 Body mass index (BMI) 38.0-38.9, adult: Secondary | ICD-10-CM | POA: Diagnosis not present

## 2021-06-18 DIAGNOSIS — E669 Obesity, unspecified: Secondary | ICD-10-CM | POA: Diagnosis present

## 2021-06-18 LAB — CBC WITH DIFFERENTIAL/PLATELET
Abs Immature Granulocytes: 0.06 10*3/uL (ref 0.00–0.07)
Basophils Absolute: 0.1 10*3/uL (ref 0.0–0.1)
Basophils Relative: 1 %
Eosinophils Absolute: 0 10*3/uL (ref 0.0–0.5)
Eosinophils Relative: 0 %
HCT: 42.6 % (ref 36.0–46.0)
Hemoglobin: 14.6 g/dL (ref 12.0–15.0)
Immature Granulocytes: 1 %
Lymphocytes Relative: 8 %
Lymphs Abs: 0.9 10*3/uL (ref 0.7–4.0)
MCH: 30.2 pg (ref 26.0–34.0)
MCHC: 34.3 g/dL (ref 30.0–36.0)
MCV: 88.2 fL (ref 80.0–100.0)
Monocytes Absolute: 0.9 10*3/uL (ref 0.1–1.0)
Monocytes Relative: 7 %
Neutro Abs: 9.7 10*3/uL — ABNORMAL HIGH (ref 1.7–7.7)
Neutrophils Relative %: 83 %
Platelets: 233 10*3/uL (ref 150–400)
RBC: 4.83 MIL/uL (ref 3.87–5.11)
RDW: 12 % (ref 11.5–15.5)
WBC: 11.6 10*3/uL — ABNORMAL HIGH (ref 4.0–10.5)
nRBC: 0 % (ref 0.0–0.2)

## 2021-06-18 LAB — COMPREHENSIVE METABOLIC PANEL
ALT: 214 U/L — ABNORMAL HIGH (ref 0–44)
AST: 294 U/L — ABNORMAL HIGH (ref 15–41)
Albumin: 3.6 g/dL (ref 3.5–5.0)
Alkaline Phosphatase: 82 U/L (ref 38–126)
Anion gap: 9 (ref 5–15)
BUN: 22 mg/dL (ref 8–23)
CO2: 22 mmol/L (ref 22–32)
Calcium: 9 mg/dL (ref 8.9–10.3)
Chloride: 105 mmol/L (ref 98–111)
Creatinine, Ser: 0.87 mg/dL (ref 0.44–1.00)
GFR, Estimated: >60 mL/min (ref >60)
Glucose, Bld: 144 mg/dL — ABNORMAL HIGH (ref 70–99)
Potassium: 4.3 mmol/L (ref 3.5–5.1)
Sodium: 136 mmol/L (ref 135–145)
Total Bilirubin: 1.6 mg/dL — ABNORMAL HIGH (ref 0.3–1.2)
Total Protein: 6.8 g/dL (ref 6.5–8.1)

## 2021-06-18 LAB — PROTIME-INR
INR: 1.2 (ref 0.8–1.2)
Prothrombin Time: 15.1 seconds (ref 11.4–15.2)

## 2021-06-18 LAB — HEMOGLOBIN A1C
Hgb A1c MFr Bld: 5.2 % (ref 4.8–5.6)
Mean Plasma Glucose: 102.54 mg/dL

## 2021-06-18 LAB — APTT: aPTT: 29 seconds (ref 24–36)

## 2021-06-18 LAB — SURGICAL PCR SCREEN
MRSA, PCR: NEGATIVE
Staphylococcus aureus: NEGATIVE

## 2021-06-18 LAB — GLUCOSE, CAPILLARY: Glucose-Capillary: 120 mg/dL — ABNORMAL HIGH (ref 70–99)

## 2021-06-18 LAB — MAGNESIUM: Magnesium: 1.9 mg/dL (ref 1.7–2.4)

## 2021-06-18 LAB — HIV ANTIBODY (ROUTINE TESTING W REFLEX): HIV Screen 4th Generation wRfx: NONREACTIVE

## 2021-06-18 MED ORDER — LACTATED RINGERS IV SOLN: INTRAVENOUS | Status: DC

## 2021-06-18 MED ORDER — BENAZEPRIL HCL 5 MG PO TABS
40.0000 mg | ORAL_TABLET | Freq: Every day | ORAL | Status: DC
  Administered 2021-06-18: 40 mg via ORAL
  Filled 2021-06-18 (×2): qty 8

## 2021-06-18 MED ORDER — HYDRALAZINE HCL 20 MG/ML IJ SOLN: 10.0000 mg | Freq: Four times a day (QID) | INTRAMUSCULAR | Status: DC | PRN

## 2021-06-18 MED ORDER — ONDANSETRON HCL 4 MG/2ML IJ SOLN
4.0000 mg | Freq: Four times a day (QID) | INTRAMUSCULAR | Status: DC | PRN
  Administered 2021-06-18: 4 mg via INTRAVENOUS
  Filled 2021-06-18: qty 2

## 2021-06-18 MED ORDER — AMLODIPINE BESYLATE 5 MG PO TABS
5.0000 mg | ORAL_TABLET | Freq: Every day | ORAL | Status: DC
  Administered 2021-06-18: 5 mg via ORAL
  Filled 2021-06-18 (×2): qty 1

## 2021-06-18 MED ORDER — FLECAINIDE ACETATE 100 MG PO TABS
100.0000 mg | ORAL_TABLET | Freq: Two times a day (BID) | ORAL | Status: DC
  Filled 2021-06-18: qty 1

## 2021-06-18 MED ORDER — METRONIDAZOLE 500 MG/100ML IV SOLN
500.0000 mg | Freq: Two times a day (BID) | INTRAVENOUS | Status: DC
  Administered 2021-06-18 (×2): 500 mg via INTRAVENOUS
  Filled 2021-06-18 (×3): qty 100

## 2021-06-18 MED ORDER — METOPROLOL TARTRATE 25 MG PO TABS
25.0000 mg | ORAL_TABLET | Freq: Two times a day (BID) | ORAL | Status: DC
  Administered 2021-06-18 – 2021-06-19 (×3): 25 mg via ORAL
  Filled 2021-06-18 (×5): qty 1

## 2021-06-18 MED ORDER — PANTOPRAZOLE SODIUM 40 MG IV SOLR
40.0000 mg | Freq: Once | INTRAVENOUS | Status: AC
  Administered 2021-06-18: 40 mg via INTRAVENOUS
  Filled 2021-06-18: qty 10

## 2021-06-18 MED ORDER — AMLODIPINE BESY-BENAZEPRIL HCL 5-40 MG PO CAPS: 1.0000 | ORAL_CAPSULE | Freq: Every day | ORAL | Status: DC

## 2021-06-18 MED ORDER — PANTOPRAZOLE SODIUM 40 MG PO TBEC
40.0000 mg | DELAYED_RELEASE_TABLET | Freq: Every day | ORAL | Status: DC
  Filled 2021-06-18: qty 1

## 2021-06-18 MED ORDER — ONDANSETRON HCL 4 MG PO TABS: 4.0000 mg | ORAL_TABLET | Freq: Four times a day (QID) | ORAL | Status: DC | PRN

## 2021-06-18 MED ORDER — POLYETHYLENE GLYCOL 3350 17 G PO PACK: 17.0000 g | PACK | Freq: Every day | ORAL | Status: DC | PRN

## 2021-06-18 MED ORDER — SODIUM CHLORIDE 0.9 % IV SOLN
2.0000 g | Freq: Once | INTRAVENOUS | Status: AC
  Administered 2021-06-18: 2 g via INTRAVENOUS
  Filled 2021-06-18: qty 20

## 2021-06-18 MED ORDER — INSULIN ASPART 100 UNIT/ML IJ SOLN: 0.0000 [IU] | Freq: Three times a day (TID) | INTRAMUSCULAR | Status: DC

## 2021-06-18 MED ORDER — HYDROMORPHONE HCL 1 MG/ML IJ SOLN
1.0000 mg | INTRAMUSCULAR | Status: DC | PRN
  Administered 2021-06-18 (×2): 1 mg via INTRAVENOUS
  Filled 2021-06-18 (×2): qty 1

## 2021-06-18 MED ORDER — HYDROMORPHONE HCL 1 MG/ML IJ SOLN
0.5000 mg | INTRAMUSCULAR | Status: DC | PRN
  Administered 2021-06-18: 0.5 mg via INTRAVENOUS
  Filled 2021-06-18: qty 0.5

## 2021-06-18 MED ORDER — ACETAMINOPHEN 650 MG RE SUPP: 650.0000 mg | Freq: Four times a day (QID) | RECTAL | Status: DC | PRN

## 2021-06-18 MED ORDER — ACETAMINOPHEN 325 MG PO TABS
650.0000 mg | ORAL_TABLET | Freq: Four times a day (QID) | ORAL | Status: DC | PRN
  Administered 2021-06-19: 650 mg via ORAL
  Filled 2021-06-18: qty 2

## 2021-06-18 NOTE — Assessment & Plan Note (Signed)
   Slightly hyperglycemic on initial chemistry  We will obtain hemoglobin A1c  We will obtain Accu-Cheks before every meal and nightly with sliding scale insulin.  If patient is normoglycemic can likely discontinue these

## 2021-06-18 NOTE — H&P (Addendum)
History and Physical    Patient: Tonya Nicholson MRN: [id number] DOA: 06/17/2021  Date of Service: the patient was seen and examined on 06/18/2021  Patient coming from: Home  Chief Complaint:  Chief Complaint  Patient presents with   Abdominal Pain    HPI:   61 year old female with past medical history of chronic atrial fibrillation (on Eliquis and Flecanide), gastroesophageal reflux disease, hypertension, vitamin D deficiency, prediabetes, obesity who presented to [facility] with complaints of abdominal pain.  Patient explains that earlier in the day on 5/23 she began to experience abdominal pain.  Patient describes abdominal pain as epigastric in location and radiating towards her back.  Pain is severe in intensity and sharp in quality.  Pain is worse with movement with no alleviating factors.  Pain continued to persist throughout the afternoon and evening and became associated with intense nausea and frequent bouts of nonbloody emesis.  Due to patient's persisting symptoms patient presented to [place] for evaluation.  Upon evaluation at [facility] emergency department, right upper quadrant ultrasound was performed revealing cholelithiasis with an 18 mm stone impacted within the gallbladder neck with gallbladder distention concerning for early cholecystitis.  Patient was having severe pain and therefore several doses of intravenous morphine were administered.  Patient was given a dose of intravenous ceftriaxone and Flagyl.  Case was discussed with Dr. Barry Dienes with surgery who recommended hospitalist admission with intravenous antibiotics.  The hospitalist group was then called and patient was accepted for transfer to [facility] for continued medical care.  Review of Systems: Review of Systems  Gastrointestinal:  Positive for abdominal pain, nausea and vomiting.    Past Medical History:  Diagnosis Date   Dyspnea    GERD (gastroesophageal reflux disease)     Hypertension    Joint pain    Knee pain    Lower extremity edema     Past Surgical History:  Procedure Laterality Date   CARDIOVERSION N/A 03/21/2018   Procedure: CARDIOVERSION;  Surgeon: Sueanne Margarita, MD;  Location: [facility] ENDOSCOPY;  Service: Cardiovascular;  Laterality: N/A;   CARDIOVERSION N/A 03/31/2018   Procedure: CARDIOVERSION;  Surgeon: Constance Haw, MD;  Location: [place];  Service: Cardiovascular;  Laterality: N/A;   CARDIOVERSION N/A 10/06/2019   Procedure: CARDIOVERSION;  Surgeon: Constance Haw, MD;  Location: [place];  Service: Cardiovascular;  Laterality: N/A;    Social History:  reports that she has never smoked. She has never used smokeless tobacco. She reports that she does not currently use alcohol after a past usage of about 9.0 standard drinks per week. She reports that she does not use drugs.  No Known Allergies  Family History  Problem Relation Age of Onset   Diabetes Mother    Hypertension Mother    Atrial fibrillation Mother    Diabetes Father    Hypertension Father    Heart disease Father        multiple PCI & CABGx3   Bladder Cancer Father    Healthy Sister    Diabetes Brother    Heart attack Brother        died in 2016-04-23    Prior to Admission medications   Medication Sig Start Date End Date Taking? Authorizing Provider  amLODipine-benazepril (LOTREL) 5-40 MG capsule Take 1 capsule by mouth daily. 03/24/21     apixaban (ELIQUIS) 5 MG TABS tablet Take 1 tablet (5 mg total) by mouth 2 (two) times daily. 10/31/20   Camnitz,  Ocie Doyne, MD  flecainide (TAMBOCOR) 100 MG tablet Take 1 tablet (100 mg total) by mouth 2 (two) times daily. 05/29/21   Camnitz, Will Hassell Done, MD  metoprolol tartrate (LOPRESSOR) 25 MG tablet TAKE 1 TABLET (25 MG TOTAL) BY MOUTH 2 (TWO) TIMES DAILY. 03/21/21   Camnitz, Ocie Doyne, MD  Semaglutide-Weight Management 1 MG/0.5ML SOAJ Inject 1 mg into the skin once a week. 05/22/21   Danford, Valetta Fuller D, NP  Vitamin D,  Ergocalciferol, (DRISDOL) 1.25 MG (50000 UNIT) CAPS capsule Take 1 capsule (50,000 Units total) by mouth every 7 (seven) days. 05/22/21   Mina Marble D, NP    Physical Exam:  Vitals:   06/17/21 2300 06/17/21 2350 06/18/21 0041 06/18/21 0550  BP: 127/60  112/61 131/71  Pulse: 83  83 92  Resp: '13  16 17  '$ Temp:  97.8 F (36.6 C) 98.4 F (36.9 C) 98.4 F (36.9 C)  TempSrc:  Oral Oral Oral  SpO2: 95%  98% 99%  Weight:      Height:        Constitutional: Awake alert and oriented x3, no associated distress.   Skin: no rashes, no lesions, good skin turgor noted. Eyes: Pupils are equally reactive to light.  No evidence of scleral icterus or conjunctival pallor.  ENMT: Moist mucous membranes noted.  Posterior pharynx clear of any exudate or lesions.   Neck: normal, supple, no masses, no thyromegaly.  No evidence of jugular venous distension.   Respiratory: clear to auscultation bilaterally, no wheezing, no crackles. Normal respiratory effort. No accessory muscle use.  Cardiovascular: Regular rate and rhythm, no murmurs / rubs / gallops. No extremity edema. 2+ pedal pulses. No carotid bruits.  Chest:   Nontender without crepitus or deformity.   Back:   Nontender without crepitus or deformity. Abdomen:  Significant RUQ and epigastric tenderness.  Abdomen is soft however.  No evidence of intra-abdominal masses.  Positive bowel sounds noted in all quadrants.   Musculoskeletal: No joint deformity upper and lower extremities. Good ROM, no contractures. Normal muscle tone.  Neurologic: CN 2-12 grossly intact. Sensation intact.  Patient moving all 4 extremities spontaneously.  Patient is following all commands.  Patient is responsive to verbal stimuli.   Psychiatric: Patient exhibits normal mood with appropriate affect.  Patient seems to possess insight as to their current situation.    Data Reviewed:  I have personally reviewed and interpreted labs, imaging.  Significant findings  are:  Chemistry revealing sodium 137, potassium 3.9, BUN 27, creatinine 1.02. White blood cell count 9.3, hemoglobin 14.5, hematocrit 44, platelet count 274.   Lipase 37.   Urinalysis revealing elevated specific gravity of 1.033 with trace protein 0-5 white blood cells per high-powered field and rare bacteria.  EKG: Personally reviewed.  Rhythm is atrial fibrillation with heart rate of 65 bpm.  No dynamic ST segment changes appreciated.   Assessment and Plan: * Acute cholecystitis Patient presenting with rather sudden onset severe abdominal pain nausea and vomiting Right upper quadrant ultrasound revealing a stone in the gallbladder neck with gallbladder distention and concern for acute cholecystitis ER provider discussed case with Dr. Barry Dienes with surgery who recommends hospitalist admission and intravenous antibiotics, surgery consult in the morning, their input is appreciated.  Holding home regimen of Eliquis, last dose AM of 5/23 Treated with intravenous ceftriaxone and Flagyl Intravenous hydration with isotonic fluid N.p.o. for now until patient is seen by surgery As needed opiate-based analgesics for substantial associated pain  Atrial fibrillation, chronic (HCC) Currently  rate controlled. Holding anticoagulation (last dose AM of 5/23)  in anticipation for possible surgery CHA2DS2-VASc score of 2, 3 of the most (if you count the prediabetes as "diabetes") so heparin infusion is not necessarily needed for bridging if patient goes to the OR on 5/24 however if surgery is delayed until 5/25 will consider bridge. Continue home regimen of rate controlling agent with flecainide and metoprolol Monitoring on telemetry Patient has outpatient cardioversion scheduled on 6/2.   Essential hypertension Resume patients home regimen of oral antihypertensives Titrate antihypertensive regimen as necessary to achieve adequate BP control PRN intravenous antihypertensives for excessively elevated  blood pressure    GERD without esophagitis Documented history of gastroesophageal reflux disease I do not see any antacids listed on the patient's home med list so will initiate Protonix while here  Prediabetes Slightly hyperglycemic on initial chemistry We will obtain hemoglobin A1c We will obtain Accu-Cheks before every meal and nightly with sliding scale insulin.  If patient is normoglycemic can likely discontinue these  Class 2 severe obesity due to excess calories with serious comorbidity and body mass index (BMI) of 38.0 to 38.9 in adult [facility]) Once patient is clinically improving, will counsel patient on caloric restriction and regular physical activity.         Code Status:  Full code  code status decision has been confirmed with: Patient Family Communication: Daughter at bedside who has been updated on plan of care.    Consults: Dr. Barry Dienes with General Surgery  Severity of Illness:  The appropriate patient status for this patient is INPATIENT. Inpatient status is judged to be reasonable and necessary in order to provide the required intensity of service to ensure the patient's safety. The patient's presenting symptoms, physical exam findings, and initial radiographic and laboratory data in the context of their chronic comorbidities is felt to place them at high risk for further clinical deterioration. Furthermore, it is not anticipated that the patient will be medically stable for discharge from the hospital within 2 midnights of admission.   * I certify that at the point of admission it is my clinical judgment that the patient will require inpatient hospital care spanning beyond 2 midnights from the point of admission due to high intensity of service, high risk for further deterioration and high frequency of surveillance required.*  Author:  Vernelle Emerald MD  06/18/2021 7:27 AM

## 2021-06-18 NOTE — Assessment & Plan Note (Signed)
.   Resume patients home regimen of oral antihypertensives . Titrate antihypertensive regimen as necessary to achieve adequate BP control . PRN intravenous antihypertensives for excessively elevated blood pressure   

## 2021-06-18 NOTE — Assessment & Plan Note (Addendum)
   Patient presenting with rather sudden onset severe abdominal pain nausea and vomiting  Right upper quadrant ultrasound revealing a stone in the gallbladder neck with gallbladder distention and concern for acute cholecystitis  ER provider discussed case with Dr. Barry Dienes with surgery who recommends hospitalist admission and intravenous antibiotics, surgery consult in the morning, their input is appreciated.   Holding home regimen of Eliquis, last dose AM of 5/23  Treated with intravenous ceftriaxone and Flagyl  Intravenous hydration with isotonic fluid  N.p.o. for now until patient is seen by surgery  As needed opiate-based analgesics for substantial associated pain

## 2021-06-18 NOTE — Discharge Instructions (Addendum)
[facility] LAPAROSCOPIC SURGERY: POST OP INSTRUCTIONS Always review your discharge instruction sheet given to you by the facility where your surgery was performed. IF YOU HAVE DISABILITY OR FAMILY LEAVE FORMS, YOU MUST BRING THEM TO THE OFFICE FOR PROCESSING.   DO NOT GIVE THEM TO YOUR DOCTOR.  PAIN CONTROL  First take acetaminophen (Tylenol) AND/or ibuprofen (Advil) to control your pain after surgery.  Follow directions on package.  Taking acetaminophen (Tylenol) and/or ibuprofen (Advil) regularly after surgery will help to control your pain and lower the amount of prescription pain medication you may need.  You should not take more than 3,000 mg (3 grams) of acetaminophen (Tylenol) in 24 hours.  You should not take ibuprofen (Advil), aleve, motrin, naprosyn or other NSAIDS if you have a history of stomach ulcers or chronic kidney disease.  A prescription for pain medication may be given to you upon discharge.  Take your pain medication as prescribed, if you still have uncontrolled pain after taking acetaminophen (Tylenol) or ibuprofen (Advil). Use ice packs to help control pain. If you need a refill on your pain medication, please contact your pharmacy.  They will contact our office to request authorization. Prescriptions will not be filled after 5pm or on week-ends.  HOME MEDICATIONS Take your usually prescribed medications unless otherwise directed.  DIET You should follow a light diet the first few days after arrival home.  Be sure to include lots of fluids daily. Avoid fatty, fried foods.   CONSTIPATION It is common to experience some constipation after surgery and if you are taking pain medication.  Increasing fluid intake and taking a stool softener (such as Colace) will usually help or prevent this problem from occurring.  A mild laxative (Milk of Magnesia or Miralax) should be taken according to package instructions if there are no bowel movements after 48  hours.  WOUND/INCISION CARE Most patients will experience some swelling and bruising in the area of the incisions.  Ice packs will help.  Swelling and bruising can take several days to resolve.  Unless discharge instructions indicate otherwise, follow guidelines below  STERI-STRIPS - you may remove your outer bandages 48 hours after surgery, and you may shower at that time.  You have steri-strips (small skin tapes) in place directly over the incision.  These strips should be left on the skin for 7-10 days.   DERMABOND/SKIN GLUE - you may shower in 24 hours.  The glue will flake off over the next 2-3 weeks. Any sutures or staples will be removed at the office during your follow-up visit.  ACTIVITIES You may resume regular (light) daily activities beginning the next day--such as daily self-care, walking, climbing stairs--gradually increasing activities as tolerated.  You may have sexual intercourse when it is comfortable.  Refrain from any heavy lifting or straining until approved by your doctor. You may drive when you are no longer taking prescription pain medication, you can comfortably wear a seatbelt, and you can safely maneuver your car and apply brakes.  FOLLOW-UP You should see your doctor in the office for a follow-up appointment approximately 2-3 weeks after your surgery.  You should have been given your post-op/follow-up appointment when your surgery was scheduled.  If you did not receive a post-op/follow-up appointment, make sure that you call for this appointment within a day or two after you arrive home to insure a convenient appointment time.   WHEN TO CALL YOUR DOCTOR: Fever over 101.0 Inability to urinate Continued bleeding from incision.   pain, redness, or drainage from the incision. Increasing abdominal pain  The clinic staff is available to answer your questions during regular business hours.  Please don't hesitate to call and ask to speak to one of the nurses for  clinical concerns.  If you have a medical emergency, go to the nearest emergency room or call 911.  A surgeon from [facility] is always on call at the hospital. [address] [place], [place], [place]  [address] ? [address] [place], [place], [place]   [address] [phone] ? [phone] ? FAX [phone] Web site: [email or web address]     ** Please follow-up with cardiologist to resume flecainide. **

## 2021-06-18 NOTE — Consult Note (Addendum)
Reason for Consult: early acute cholecystitis Referring Physician: [name] is an 61 y.o. female.  HPI:  Patient is a 61 year old female who presented to the [facility] emergency department after having persistent right upper quadrant and epigastric pain.  This came on suddenly around 1230 yesterday and worsened throughout the afternoon.  She stated that it came on before she ate lunch.  As it did not improve she sought help.  She was seen to have normal LFTs in the emergency department and an ultrasound showing a gallstone lodged in the gallbladder neck.  At that point there was new associated gallbladder wall thickening or pericholecystic fluid.  However, the stone was 18 mm.  She had no evidence of ductal dilatation.  Of note, her mother has had gallbladder disease and has required cholecystectomy.  She denies other abdominal surgery.  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  Past Medical History:  Diagnosis Date   Dyspnea    GERD (gastroesophageal reflux disease)    Hypertension    Joint pain    Knee pain    Lower extremity edema     Past Surgical History:  Procedure Laterality Date   CARDIOVERSION N/A 03/21/2018   Procedure: CARDIOVERSION;  Surgeon: Sueanne Margarita, MD;  Location: [facility] ENDOSCOPY;  Service: Cardiovascular;  Laterality: N/A;   CARDIOVERSION N/A 03/31/2018   Procedure: CARDIOVERSION;  Surgeon: Constance Haw, MD;  Location: [facility] ENDOSCOPY;  Service: Cardiovascular;  Laterality: N/A;   CARDIOVERSION N/A 10/06/2019   Procedure: CARDIOVERSION;  Surgeon: Constance Haw, MD;  Location: [facility] ENDOSCOPY;  Service: Cardiovascular;  Laterality: N/A;    Family History  Problem Relation Age of Onset   Diabetes Mother    Hypertension Mother    Atrial fibrillation Mother    Diabetes Father    Hypertension Father    Heart disease Father        multiple PCI & CABGx3   Bladder Cancer Father    Healthy Sister    Diabetes Brother     Heart attack Brother        died in April 27, 2016    Social History:  reports that she has never smoked. She has never used smokeless tobacco. She reports that she does not currently use alcohol after a past usage of about 9.0 standard drinks per week. She reports that she does not use drugs.  Allergies: No Known Allergies  Medications:  Outpatient Medications amLODipine-benazepril (LOTREL) 5-40 MG capsule apixaban (ELIQUIS) 5 MG TABS tablet flecainide (TAMBOCOR) 100 MG tablet metoprolol tartrate (LOPRESSOR) 25 MG tablet Semaglutide-Weight Management 1 MG/0.5ML SOAJ Vitamin D, Ergocalciferol, (DRISDOL) 1.25 MG (50000 UNIT) CAPS capsule  Results for orders placed or performed during the hospital encounter of 06/17/21 (from the past 48 hour(s))  Urinalysis, Routine w reflex microscopic     Status: Abnormal   Collection Time: 06/17/21  7:20 PM  Result Value Ref Range   Color, Urine YELLOW YELLOW   APPearance HAZY (A) CLEAR   Specific Gravity, Urine 1.033 (H) 1.005 - 1.030   pH 5.0 5.0 - 8.0   Glucose, UA NEGATIVE NEGATIVE mg/dL   Hgb urine dipstick NEGATIVE NEGATIVE   Bilirubin Urine NEGATIVE NEGATIVE   Ketones, ur NEGATIVE NEGATIVE mg/dL   Protein, ur TRACE (A) NEGATIVE mg/dL   Nitrite NEGATIVE NEGATIVE   Leukocytes,Ua NEGATIVE NEGATIVE   WBC, UA 0-5 0 - 5 WBC/hpf   Bacteria, UA RARE (A) NONE SEEN   Squamous Epithelial / LPF  0-5 0 - 5   Mucus PRESENT    Ca Oxalate Crys, UA PRESENT     Comment: Performed at [facility], [place], [place] [address]  Lipase, blood     Status: None   Collection Time: 06/17/21  7:27 PM  Result Value Ref Range   Lipase 37 11 - 51 U/L    Comment: Performed at [facility], [place], [place] [address]  Comprehensive metabolic panel     Status: Abnormal   Collection Time: 06/17/21  7:27 PM  Result Value Ref Range   Sodium 137 135 - 145 mmol/L   Potassium 3.9 3.5 - 5.1 mmol/L   Chloride  100 98 - 111 mmol/L   CO2 26 22 - 32 mmol/L   Glucose, Bld 132 (H) 70 - 99 mg/dL    Comment: Glucose reference range applies only to samples taken after fasting for at least 8 hours.   BUN 27 (H) 8 - 23 mg/dL   Creatinine, Ser 1.02 (H) 0.44 - 1.00 mg/dL   Calcium 9.4 8.9 - 10.3 mg/dL   Total Protein 7.6 6.5 - 8.1 g/dL   Albumin 4.3 3.5 - 5.0 g/dL   AST 15 15 - 41 U/L   ALT 19 0 - 44 U/L   Alkaline Phosphatase 58 38 - 126 U/L   Total Bilirubin 0.5 0.3 - 1.2 mg/dL   GFR, Estimated >60 >60 mL/min    Comment: (NOTE) Calculated using the CKD-EPI Creatinine Equation (2021)    Anion gap 11 5 - 15    Comment: Performed at [facility], [address], [place], [place] [address]  CBC     Status: None   Collection Time: 06/17/21  7:27 PM  Result Value Ref Range   WBC 9.3 4.0 - 10.5 K/uL   RBC 4.93 3.87 - 5.11 MIL/uL   Hemoglobin 14.5 12.0 - 15.0 g/dL   HCT 44.0 36.0 - 46.0 %   MCV 89.2 80.0 - 100.0 fL   MCH 29.4 26.0 - 34.0 pg   MCHC 33.0 30.0 - 36.0 g/dL   RDW 12.2 11.5 - 15.5 %   Platelets 274 150 - 400 K/uL   nRBC 0.0 0.0 - 0.2 %    Comment: Performed at [facility], [address] [place], [place] [address]  Surgical pcr screen     Status: None   Collection Time: 06/18/21 12:43 AM   Specimen: Nasal Mucosa; Nasal Swab  Result Value Ref Range   MRSA, PCR NEGATIVE NEGATIVE   Staphylococcus aureus NEGATIVE NEGATIVE    Comment: (NOTE) The Xpert SA Assay (FDA approved for NASAL specimens in patients 94 years of age and older), is one component of a comprehensive surveillance program. It is not intended to diagnose infection nor to guide or monitor treatment. Performed at [facility], [place] [address] [place], [place] [address]   CBC with Differential     Status: Abnormal   Collection Time: 06/18/21  3:28 AM  Result Value Ref Range   WBC 11.6 (H) 4.0 - 10.5 K/uL   RBC 4.83 3.87 - 5.11 MIL/uL   Hemoglobin 14.6 12.0 - 15.0 g/dL    HCT 42.6 36.0 - 46.0 %   MCV 88.2 80.0 - 100.0 fL   MCH 30.2 26.0 - 34.0 pg   MCHC 34.3 30.0 - 36.0 g/dL   RDW 12.0 11.5 - 15.5 %   Platelets 233 150 - 400 K/uL  nRBC 0.0 0.0 - 0.2 %   Neutrophils Relative % 83 %   Neutro Abs 9.7 (H) 1.7 - 7.7 K/uL   Lymphocytes Relative 8 %   Lymphs Abs 0.9 0.7 - 4.0 K/uL   Monocytes Relative 7 %   Monocytes Absolute 0.9 0.1 - 1.0 K/uL   Eosinophils Relative 0 %   Eosinophils Absolute 0.0 0.0 - 0.5 K/uL   Basophils Relative 1 %   Basophils Absolute 0.1 0.0 - 0.1 K/uL   Immature Granulocytes 1 %   Abs Immature Granulocytes 0.06 0.00 - 0.07 K/uL    Comment: Performed at [facility] [address], [place], [place] [address]  Comprehensive metabolic panel     Status: Abnormal   Collection Time: 06/18/21  3:28 AM  Result Value Ref Range   Sodium 136 135 - 145 mmol/L   Potassium 4.3 3.5 - 5.1 mmol/L   Chloride 105 98 - 111 mmol/L   CO2 22 22 - 32 mmol/L   Glucose, Bld 144 (H) 70 - 99 mg/dL    Comment: Glucose reference range applies only to samples taken after fasting for at least 8 hours.   BUN 22 8 - 23 mg/dL   Creatinine, Ser 0.87 0.44 - 1.00 mg/dL   Calcium 9.0 8.9 - 10.3 mg/dL   Total Protein 6.8 6.5 - 8.1 g/dL   Albumin 3.6 3.5 - 5.0 g/dL   AST 294 (H) 15 - 41 U/L   ALT 214 (H) 0 - 44 U/L   Alkaline Phosphatase 82 38 - 126 U/L   Total Bilirubin 1.6 (H) 0.3 - 1.2 mg/dL   GFR, Estimated >60 >60 mL/min    Comment: (NOTE) Calculated using the CKD-EPI Creatinine Equation (2021)    Anion gap 9 5 - 15    Comment: Performed at [facility], [place] [address] [place], [place] [address]  Protime-INR     Status: None   Collection Time: 06/18/21  3:28 AM  Result Value Ref Range   Prothrombin Time 15.1 11.4 - 15.2 seconds   INR 1.2 0.8 - 1.2    Comment: (NOTE) INR goal varies based on device and disease states. Performed at [facility], [place] [address], [place], [place] [address]   Hemoglobin A1c     Status: None    Collection Time: 06/18/21  3:28 AM  Result Value Ref Range   Hgb A1c MFr Bld 5.2 4.8 - 5.6 %    Comment: (NOTE) Pre diabetes:          5.7%-6.4%  Diabetes:              >6.4%  Glycemic control for   <7.0% adults with diabetes    Mean Plasma Glucose 102.54 mg/dL    Comment: Performed at [facility] [address] [place], [place] [address]  Magnesium     Status: None   Collection Time: 06/18/21  3:28 AM  Result Value Ref Range   Magnesium 1.9 1.7 - 2.4 mg/dL    Comment: Performed at [facility] [address], [place], [place] [address]  APTT     Status: None   Collection Time: 06/18/21  3:28 AM  Result Value Ref Range   aPTT 29 24 - 36 seconds    Comment: Performed at [facility] [address] [place], [place] [address]    US Abdomen Limited RUQ (LIVER/GB)  Result Date: 06/17/2021 CLINICAL DATA:  [id number].  Right upper quadrant abdominal pain, nausea EXAM: ULTRASOUND ABDOMEN LIMITED RIGHT UPPER QUADRANT COMPARISON:  None Available. FINDINGS: Gallbladder: An 18 mm gallstone is seen impacted within the gallbladder neck and the gallbladder is mildly distended. However, there is no gallbladder wall thickening, and no pericholecystic fluid is identified. The sonographic Percell Miller sign is reportedly negative. Common bile duct: Diameter: 4 mm in proximal diameter Liver: No focal lesion identified. Within normal limits in parenchymal echogenicity. Portal vein is patent on color Doppler imaging with normal direction of blood flow towards the liver. Other: None. IMPRESSION: Cholelithiasis with 18 mm gallstone impacted within the gallbladder neck. Mild associated gallbladder distension. The findings can be seen in early changes of acute cholecystitis and correlation with liver enzymes may be helpful. Electronically Signed   By: Fidela Salisbury M.D.   On: 06/17/2021 20:40    Review of Systems  Cardiovascular:        In atrial fibrillation  Gastrointestinal:  Positive for  abdominal pain and nausea.  All other systems reviewed and are negative. Blood pressure 131/71, pulse 92, temperature 98.4 F (36.9 C), temperature source Oral, resp. rate 17, height '5\' 7"'$  (1.702 m), weight 111 kg, last menstrual period 04/09/2016, SpO2 99 %. Physical Exam Vitals reviewed.  Constitutional:      General: Distressed: looks uncomfortable.     Appearance: She is well-developed.  HENT:     Head: Normocephalic and atraumatic.     Mouth/Throat:     Mouth: Mucous membranes are moist.  Eyes:     General: No scleral icterus.    Extraocular Movements: Extraocular movements intact.     Pupils: Pupils are equal, round, and reactive to light.  Cardiovascular:     Rate and Rhythm: Normal rate. Rhythm irregular.     Heart sounds: Normal heart sounds. No murmur heard.   No gallop.  Pulmonary:     Effort: Pulmonary effort is normal. No respiratory distress.     Breath sounds: Normal breath sounds. No stridor. No wheezing, rhonchi or rales.  Abdominal:     General: Abdomen is flat. Bowel sounds are decreased. There is no distension. There are no signs of injury.     Palpations: Abdomen is soft. There is no shifting dullness, fluid wave, hepatomegaly or splenomegaly.     Tenderness: There is abdominal tenderness in the right upper quadrant. There is no guarding or rebound.  Skin:    General: Skin is warm and dry.     Capillary Refill: Capillary refill takes 2 to 3 seconds.     Coloration: Skin is not cyanotic, jaundiced, mottled or pale.  Neurological:     General: No focal deficit present.     Mental Status: She is alert and oriented to person, place, and time.     Cranial Nerves: No cranial nerve deficit.     Motor: No weakness.  Psychiatric:        Mood and Affect: Mood normal. Mood is not anxious or depressed.        Behavior: Behavior normal.    Assessment/Plan:  Early acute cholecystitis Atrial fibrillation On continuous oral anticoagulation Elevated liver function  tests  Patient will need cholecystectomy.  She currently has very early cholecystitis.  The plan will be to hold her Eliquis and most likely proceed with surgery tomorrow.  She is in A-fib at a controlled rate.  She was supposed to have an ablation on June 2.  We will notify Dr. Curt Bears of her admission.  I discussed surgery  with the patient.  I also discussed that in patients with atrial fibrillation, it is not uncommon for patients to develop rapid ventricular response due to the stress of surgery.  She is on telemetry which should continue.  We would request any necessary restratification.  The patient does have an echocardiogram from 2020 which looks like she has good cardiac function.  Her LFTs have elevated this morning.  If these continue to rise, she may end up needing an MRCP.  We will let her have clear liquids today.  The surgical procedure was described to the patient in detail.   I discussed the incision type and location, the location of the gallbladder, the anatomy of the bile ducts and arteries, and the typical progression of surgery.      Milus Height, MD FACS Surgical Oncology, General Surgery, Trauma and [facility], [place] for weekday/non holidays Check [email or web address] for coverage night/weekend/holidays  Do not use SecureChat as it is not reliable for timely patient care.

## 2021-06-18 NOTE — Assessment & Plan Note (Addendum)
   Currently rate controlled.  Holding anticoagulation (last dose AM of 5/23)  in anticipation for possible surgery  CHA2DS2-VASc score of 2, 3 of the most (if you count the prediabetes as "diabetes") so heparin infusion is not necessarily needed for bridging if patient goes to the OR on 5/24 however if surgery is delayed until 5/25 will consider bridge.  Continue home regimen of rate controlling agent with flecainide and metoprolol  Monitoring on telemetry  Patient has outpatient cardioversion scheduled on 6/2.

## 2021-06-18 NOTE — Assessment & Plan Note (Signed)
   Documented history of gastroesophageal reflux disease  I do not see any antacids listed on the patient's home med list so will initiate Protonix while here

## 2021-06-18 NOTE — Progress Notes (Signed)
PROGRESS NOTE    Tonya Nicholson  JME:[id number] DOB: 1960-04-30 DOA: 06/17/2021 PCP: Linda Hedges, DO    Brief Narrative:   Tonya Nicholson is a 61 y.o. female with past medical history significant for chronic atrial fibrillation on Eliquis/flecainide, GERD, essential pretension, vitamin D deficiency, obesity who presented to [place] on 5/24 with complaints of abdominal pain.  Patient reports onset earlier in the day, localized to the epigastric region and radiating towards her back.  Severe in intensity and sharp in nature.  Pain is worse with movement without alleviating factors.  Now associated with nausea and nonbloody emesis.  In the ED, temperature 97.8 F, HR 81, RR 20, BP 112/97, SPO2 97% on room air.  WBC 9.3, hemoglobin 14.5, platelets 274.  Sodium 137, potassium 3.9, chloride 100, CO2 26, glucose 132, BUN 27, creatinine 1.02.  Lipase 37.  AST 15, ALT 19, total bilirubin 0.5.  Urinalysis unrevealing.  Right upper quadrant ultrasound with cholelithiasis with 18 mm gallstone impacted within the gallbladder neck, mild associated gallbladder distention consistent with acute cholecystitis.  General surgery was consulted.  Patient was transferred to [facility] and hospitalist consulted for admission for acute cholecystitis.  Assessment & Plan:   Acute cholecystitis Patient presenting with acute onset abdominal pain with right upper quadrant ultrasound findings with cholelithiasis with 18 mm gallstone impacted within the gallbladder neck, mild associated gallbladder distention.  --General surgery following, appreciate assistance --AST 15>294 --ALT 19>214 --Tbili 0.5>1.6 --Ceftriaxone 2 g IV q24h --Metronidazole '500mg'$  IV q12h --LR at 125 mL/h --CLD, NPO after midnight for planned laparoscopic cholecystectomy 5/25 --Zofran as needed --Dilaudid 0.5-1 mg IV q3h PRN moderate/severe pain --CBC/CMP daily  Permanent atrial fibrillation Patient follows with cardiology  outpatient.  Currently on flecainide and Eliquis. --Continue to hold Eliquis --Flecainide discontinued per cardiology today --Plan outpatient cardioversion, will likely need to be rescheduled for interruption of anticoagulation  GERD --Protonix 40 mg p.o. daily  Essential pretension --Amlodipine 5 mg p.o. daily --Benazepril 40 mg p.o. daily --Hydralazine 10 mg IV q6h PRN SBP >180 or DBP >110  Morbid obesity Body mass index is 38.33 kg/m.  Discussed with patient needs for aggressive lifestyle changes/weight loss as this complicates all facets of care.  Outpatient follow-up with PCP.     DVT prophylaxis: SCDs Start: 06/18/21 0329    Code Status: Full Code Family Communication: Updated daughter present at bedside this morning  Disposition Plan:  Level of care: Telemetry Surgical Status is: Inpatient Remains inpatient appropriate because: IV antibiotics, IV fluid hydration, pending laparoscopic cholecystectomy tomorrow    Consultants:  General surgery Cardiology  Procedures:  Right upper quadrant ultrasound  Antimicrobials:  Ceftriaxone 5/23>> Metronidazole 5/23>>   Subjective: Patient seen examined bedside, resting comfortably.  Daughter present.  Seen by general surgery this morning with plan for laparoscopic cholecystectomy tomorrow.  Pain currently controlled.  No other specific questions or concerns at this time.  Denies headache, no chest pain, no palpitations, no shortness of breath, no fever/chills/night sweats, no current nausea/vomiting, no focal weakness, no fatigue, no paresthesias.  No acute events overnight per nursing staff.  Objective: Vitals:   06/17/21 2350 06/18/21 0041 06/18/21 0550 06/18/21 0739  BP:  112/61 131/71 124/73  Pulse:  83 92 82  Resp:  '16 17 17  '$ Temp: 97.8 F (36.6 C) 98.4 F (36.9 C) 98.4 F (36.9 C) 98.2 F (36.8 C)  TempSrc: Oral Oral Oral Oral  SpO2:  98% 99% 98%  Weight:  Height:        Intake/Output Summary (Last 24  hours) at 06/18/2021 1547 Last data filed at 06/17/2021 2327 Gross per 24 hour  Intake 98.57 ml  Output --  Net 98.57 ml   Filed Weights   06/17/21 1903  Weight: 111 kg    Examination:  Physical Exam: GEN: NAD, alert and oriented x 3 HEENT: NCAT, PERRL, EOMI, sclera clear, MMM PULM: CTAB w/o wheezes/crackles, normal respiratory effort, on room air CV: RRR w/o M/G/R GI: abd soft, nondistended, mild epigastric/right upper quadrant tenderness to palpation, NABS, no R/G/M MSK: no peripheral edema, muscle strength globally intact 5/5 bilateral upper/lower extremities NEURO: CN II-XII intact, no focal deficits, sensation to light touch intact PSYCH: normal mood/affect Integumentary: dry/intact, no rashes or wounds    Data Reviewed: I have personally reviewed following labs and imaging studies  CBC: Recent Labs  Lab 06/17/21 1927 06/18/21 0328  WBC 9.3 11.6*  NEUTROABS  --  9.7*  HGB 14.5 14.6  HCT 44.0 42.6  MCV 89.2 88.2  PLT 274 540   Basic Metabolic Panel: Recent Labs  Lab 06/17/21 1927 06/18/21 0328  NA 137 136  K 3.9 4.3  CL 100 105  CO2 26 22  GLUCOSE 132* 144*  BUN 27* 22  CREATININE 1.02* 0.87  CALCIUM 9.4 9.0  MG  --  1.9   GFR: Estimated Creatinine Clearance: 87.3 mL/min (by C-G formula based on SCr of 0.87 mg/dL). Liver Function Tests: Recent Labs  Lab 06/17/21 1927 06/18/21 0328  AST 15 294*  ALT 19 214*  ALKPHOS 58 82  BILITOT 0.5 1.6*  PROT 7.6 6.8  ALBUMIN 4.3 3.6   Recent Labs  Lab 06/17/21 1927  LIPASE 37   No results for input(s): AMMONIA in the last 168 hours. Coagulation Profile: Recent Labs  Lab 06/18/21 0328  INR 1.2   Cardiac Enzymes: No results for input(s): CKTOTAL, CKMB, CKMBINDEX, TROPONINI in the last 168 hours. BNP (last 3 results) No results for input(s): PROBNP in the last 8760 hours. HbA1C: Recent Labs    06/18/21 0328  HGBA1C 5.2   CBG: Recent Labs  Lab 06/18/21 0735  GLUCAP 120*   Lipid  Profile: No results for input(s): CHOL, HDL, LDLCALC, TRIG, CHOLHDL, LDLDIRECT in the last 72 hours. Thyroid Function Tests: No results for input(s): TSH, T4TOTAL, FREET4, T3FREE, THYROIDAB in the last 72 hours. Anemia Panel: No results for input(s): VITAMINB12, FOLATE, FERRITIN, TIBC, IRON, RETICCTPCT in the last 72 hours. Sepsis Labs: No results for input(s): PROCALCITON, LATICACIDVEN in the last 168 hours.  Recent Results (from the past 240 hour(s))  Surgical pcr screen     Status: None   Collection Time: 06/18/21 12:43 AM   Specimen: Nasal Mucosa; Nasal Swab  Result Value Ref Range Status   MRSA, PCR NEGATIVE NEGATIVE Final   Staphylococcus aureus NEGATIVE NEGATIVE Final    Comment: (NOTE) The Xpert SA Assay (FDA approved for NASAL specimens in patients 66 years of age and older), is one component of a comprehensive surveillance program. It is not intended to diagnose infection nor to guide or monitor treatment. Performed at [facility], [place] [address], [place], [place] [address]          Radiology Studies: US Abdomen Limited RUQ (LIVER/GB)  Result Date: 06/17/2021 CLINICAL DATA:  151470.  Right upper quadrant abdominal pain, nausea EXAM: ULTRASOUND ABDOMEN LIMITED RIGHT UPPER QUADRANT COMPARISON:  None Available. FINDINGS: Gallbladder: An 18 mm gallstone is seen impacted within  the gallbladder neck and the gallbladder is mildly distended. However, there is no gallbladder wall thickening, and no pericholecystic fluid is identified. The sonographic Percell Miller sign is reportedly negative. Common bile duct: Diameter: 4 mm in proximal diameter Liver: No focal lesion identified. Within normal limits in parenchymal echogenicity. Portal vein is patent on color Doppler imaging with normal direction of blood flow towards the liver. Other: None. IMPRESSION: Cholelithiasis with 18 mm gallstone impacted within the gallbladder neck. Mild associated gallbladder distension. The findings can  be seen in early changes of acute cholecystitis and correlation with liver enzymes may be helpful. Electronically Signed   By: Fidela Salisbury M.D.   On: 06/17/2021 20:40        Scheduled Meds:  amLODipine  5 mg Oral Daily   And   benazepril  40 mg Oral Daily   metoprolol tartrate  25 mg Oral BID   [START ON 06/19/2021] pantoprazole  40 mg Oral Daily   Continuous Infusions:  cefTRIAXone (ROCEPHIN)  IV     lactated ringers 125 mL/hr at 06/18/21 1340   metronidazole 500 mg (06/18/21 1039)     LOS: 0 days    Time spent: 51 minutes spent on chart review, discussion with nursing staff, consultants, updating family and interview/physical exam; more than 50% of that time was spent in counseling and/or coordination of care.    Paige Monarrez J [place] DO [facility] Available via Epic secure chat 7am-7pm After these hours, please refer to coverage provider listed on [email or web address] 06/18/2021, 3:47 PM

## 2021-06-18 NOTE — Assessment & Plan Note (Signed)
.   Once patient is clinically improving, will counsel patient on caloric restriction and regular physical activity.   

## 2021-06-18 NOTE — ED Notes (Addendum)
Report given to [facility] and Rande Brunt RN @ [facility]

## 2021-06-18 NOTE — Consult Note (Addendum)
Cardiology Consultation:   Patient ID: Tonya Nicholson MRN: [id number]; DOB: 10-06-1960  Admit date: 06/17/2021 Date of Consult: 06/18/2021  PCP:  Linda Hedges, [facility] [place] [facility] Cardiologist:  Keyla Milone Meredith Leeds, MD     Patient Profile:   Tonya Nicholson is a 61 y.o. female with a hx of HTN, Obesity, Afib, GERD who is being seen 06/18/2021 for the evaluation of pre-op evaluation at the request of Dr. Dessa Phi.  AFib/AAD hx Diagnosed 2020 Flecainide started .looks like Sep 2021  History of Present Illness:   Tonya Nicholson last saw Dr. Curt Bears 05/29/21, she was back in Afib, her Flecainide increased and planned for DCCV, patient preferring to avoid ablation.  She was admitted yesterday with new onset abdominal pain, , progressively worsened through the afternoon/evening > N/V. W/u concerning for an acute chole and she was admitted, started on ABX. Surgery has seen her, Eliquis held with plans to the OR tomorrow, requested cardiac risk stratification.  LABS K+ 4.3 BUN/Creat 22/0.81 Mag 1.9 AST 294 ALT 214 WBC 11.6 H/H 14/42 Plts 233   RCRI score is zero, 0.4%, as far as I can tell, planned for lap-chole   She has of late felt well, no CP, she is largely unaware of her Afib,no palpitations, no unusual SOB Good exertional/functional capacity, without changes of late in her capacity  Past Medical History:  Diagnosis Date   Dyspnea    GERD (gastroesophageal reflux disease)    Hypertension    Joint pain    Knee pain    Lower extremity edema     Past Surgical History:  Procedure Laterality Date   CARDIOVERSION N/A 03/21/2018   Procedure: CARDIOVERSION;  Surgeon: Sueanne Margarita, MD;  Location: [place];  Service: Cardiovascular;  Laterality: N/A;   CARDIOVERSION N/A 03/31/2018   Procedure: CARDIOVERSION;  Surgeon: Constance Haw, MD;  Location: [place];  Service: Cardiovascular;  Laterality: N/A;   CARDIOVERSION N/A 10/06/2019   Procedure:  CARDIOVERSION;  Surgeon: Constance Haw, MD;  Location: [place];  Service: Cardiovascular;  Laterality: N/A;     Home Medications:  Prior to Admission medications   Medication Sig Start Date End Date Taking? Authorizing Provider  amLODipine-benazepril (LOTREL) 5-40 MG capsule Take 1 capsule by mouth daily. 03/24/21  Yes   apixaban (ELIQUIS) 5 MG TABS tablet Take 1 tablet (5 mg total) by mouth 2 (two) times daily. 10/31/20  Yes Taesha Goodell, Ocie Doyne, MD  flecainide (TAMBOCOR) 100 MG tablet Take 1 tablet (100 mg total) by mouth 2 (two) times daily. 05/29/21  Yes Torie Priebe Hassell Done, MD  metoprolol tartrate (LOPRESSOR) 25 MG tablet TAKE 1 TABLET (25 MG TOTAL) BY MOUTH 2 (TWO) TIMES DAILY. 03/21/21  Yes Peyton Spengler, Ocie Doyne, MD  Semaglutide-Weight Management 1 MG/0.5ML SOAJ Inject 1 mg into the skin once a week. Patient taking differently: Inject 1 mg into the skin once a week. Thursday 05/22/21  Yes Danford, Valetta Fuller D, NP  Vitamin D, Ergocalciferol, (DRISDOL) 1.25 MG (50000 UNIT) CAPS capsule Take 1 capsule (50,000 Units total) by mouth every 7 (seven) days. Patient taking differently: Take 50,000 Units by mouth every 7 (seven) days. Sunday 05/22/21  Yes Danford, Valetta Fuller D, NP    Inpatient Medications: Scheduled Meds:  amLODipine  5 mg Oral Daily   And   benazepril  40 mg Oral Daily   flecainide  100 mg Oral BID   metoprolol tartrate  25 mg Oral BID   [START ON 06/19/2021] pantoprazole  40  mg Oral Daily   Continuous Infusions:  cefTRIAXone (ROCEPHIN)  IV     lactated ringers 125 mL/hr at 06/18/21 0537   metronidazole     PRN Meds: acetaminophen **OR** acetaminophen, hydrALAZINE, HYDROmorphone (DILAUDID) injection **OR** HYDROmorphone (DILAUDID) injection, ondansetron **OR** ondansetron (ZOFRAN) IV, polyethylene glycol  Allergies:   No Known Allergies  Social History:   Social History   Socioeconomic History   Marital status: Divorced    Spouse name: Not on file   Number of  children: Not on file   Years of education: Not on file   Highest education level: Not on file  Occupational History   Occupation: Art therapist  Tobacco Use   Smoking status: Never   Smokeless tobacco: Never  Vaping Use   Vaping Use: Never used  Substance and Sexual Activity   Alcohol use: Not Currently    Alcohol/week: 9.0 standard drinks    Types: 2 Shots of liquor, 7 Standard drinks or equivalent per week   Drug use: No   Sexual activity: Never  Other Topics Concern   Not on file  Social History Narrative   Not on file   Social Determinants of Health   Financial Resource Strain: Not on file  Food Insecurity: Not on file  Transportation Needs: Not on file  Physical Activity: Not on file  Stress: Not on file  Social Connections: Not on file  Intimate Partner Violence: Not on file    Family History:   Family History  Problem Relation Age of Onset   Diabetes Mother    Hypertension Mother    Atrial fibrillation Mother    Diabetes Father    Hypertension Father    Heart disease Father        multiple PCI & CABGx3   Bladder Cancer Father    Healthy Sister    Diabetes Brother    Heart attack Brother        died in April 20, 2016     ROS:  Please see the history of present illness.  All other ROS reviewed and negative.     Physical Exam/Data:   Vitals:   06/17/21 2350 06/18/21 0041 06/18/21 0550 06/18/21 0739  BP:  112/61 131/71 124/73  Pulse:  83 92 82  Resp:  '16 17 17  '$ Temp: 97.8 F (36.6 C) 98.4 F (36.9 C) 98.4 F (36.9 C) 98.2 F (36.8 C)  TempSrc: Oral Oral Oral Oral  SpO2:  98% 99% 98%  Weight:      Height:        Intake/Output Summary (Last 24 hours) at 06/18/2021 1002 Last data filed at 06/17/2021 Apr 20, 2325 Gross per 24 hour  Intake 98.57 ml  Output --  Net 98.57 ml      06/17/2021    7:03 PM 05/29/2021    8:47 AM 05/22/2021    9:00 AM  Last 3 Weights  Weight (lbs) 244 lb 11.4 oz 244 lb 12.8 oz 238 lb  Weight (kg) 111 kg 111.041 kg 107.956 kg      Body mass index is 38.33 kg/m.  General:  Well nourished, well developed, in no acute distress HEENT: normal Neck: no JVD Vascular: No carotid bruits; Distal pulses 2+ bilaterally Cardiac:  irreg-irreg; no murmurs, gallos or rubs Lungs:  CTA b/ly, no wheezing, rhonchi or rales  Abd: not examined  Ext: no edema Musculoskeletal:  No deformities Skin: warm and dry  Neuro:  no gross focal abnormalities noted Psych:  Normal affect   EKG:  The  EKG was personally reviewed and demonstrates:   AFib 65bpm, no ischemic/acute changes  Telemetry:  not on telemetry  Relevant CV Studies:  TTE 03/04/2018  1. The left ventricle has normal systolic function of 40-98%. The cavity  size was normal. There is no increased left ventricular wall thickness.  Left ventricular diastology could not be evaluated secondary to atrial  fibrillation.   2. The right ventricle has normal systolic function. The cavity was  normal. There there are no ECGs that show atrial fibrillation no increase in right ventricular wall thickness.   3. Left atrial size was mildly dilated.   4. Normal RVSP   5. The inferior vena cava was dilated in size with <50% respiratory  variability.    Laboratory Data:  High Sensitivity Troponin:  No results for input(s): TROPONINIHS in the last 720 hours.   Chemistry Recent Labs  Lab 06/17/21 1927 06/18/21 0328  NA 137 136  K 3.9 4.3  CL 100 105  CO2 26 22  GLUCOSE 132* 144*  BUN 27* 22  CREATININE 1.02* 0.87  CALCIUM 9.4 9.0  MG  --  1.9  GFRNONAA >60 >60  ANIONGAP 11 9    Recent Labs  Lab 06/17/21 1927 06/18/21 0328  PROT 7.6 6.8  ALBUMIN 4.3 3.6  AST 15 294*  ALT 19 214*  ALKPHOS 58 82  BILITOT 0.5 1.6*   Lipids No results for input(s): CHOL, TRIG, HDL, LABVLDL, LDLCALC, CHOLHDL in the last 168 hours.  Hematology Recent Labs  Lab 06/17/21 1927 06/18/21 0328  WBC 9.3 11.6*  RBC 4.93 4.83  HGB 14.5 14.6  HCT 44.0 42.6  MCV 89.2 88.2  MCH 29.4 30.2   MCHC 33.0 34.3  RDW 12.2 12.0  PLT 274 233   Thyroid No results for input(s): TSH, FREET4 in the last 168 hours.  BNPNo results for input(s): BNP, PROBNP in the last 168 hours.  DDimer No results for input(s): DDIMER in the last 168 hours.   Radiology/Studies:  US Abdomen Limited RUQ (LIVER/GB) Result Date: 06/17/2021 CLINICAL DATA:  151470.  Right upper quadrant abdominal pain, nausea EXAM: ULTRASOUND ABDOMEN LIMITED RIGHT UPPER QUADRANT COMPARISON:  None Available. FINDINGS: Gallbladder: An 18 mm gallstone is seen impacted within the gallbladder neck and the gallbladder is mildly distended. However, there is no gallbladder wall thickening, and no pericholecystic fluid is identified. The sonographic Percell Miller sign is reportedly negative. Common bile duct: Diameter: 4 mm in proximal diameter Liver: No focal lesion identified. Within normal limits in parenchymal echogenicity. Portal vein is patent on color Doppler imaging with normal direction of blood flow towards the liver. Other: None. IMPRESSION: Cholelithiasis with 18 mm gallstone impacted within the gallbladder neck. Mild associated gallbladder distension. The findings can be seen in early changes of acute cholecystitis and correlation with liver enzymes may be helpful. Electronically Signed   By: Fidela Salisbury M.D.   On: 06/17/2021 20:40     Assessment and Plan:   Persistent AFib CHA2DS2Vasc is one for gender, on Eliquis, held ofr the OR Rate controlled No hx of CAD  Lap-chole is considered a low cardiac risk surgery The patient a low cardiac risk patient. No cardiac contraindications to her surgery planned Resume Elisquis as soon as is safe to do so post-op  Phuc Kluttz cancel her DCCV and plan to reschedule once back on uninterrupted [place] post-op She has been in AFib presumably a few weeks at least I Shakiera Edelson hold her Flecainide for now given she Kristoffer Bala be off  her [place] to avoid conversion while off her a/c  Dr. Curt Bears Jema Deegan see her later  today      Risk Assessment/Risk Scores:    For questions or updates, please contact [place] Please consult [email or web address] for contact info under    Signed, Baldwin [place], PA-C  06/18/2021 10:02 AM;t  I have seen and examined this patient with Tommye Standard.  Agree with above, note added to reflect my findings.  Patient presented to the hospital after being found to have abdominal pain.  She was found to have acute cholecystitis.  She has plans for surgery potentially tomorrow.  She does have a history of atrial fibrillation.  She is able to all of her daily activities without restriction.  Other than her abdominal discomfort, she feels well.  GEN: Well nourished, well developed, in no acute distress  HEENT: normal  Neck: no JVD, carotid bruits, or masses Cardiac: Irregular; no murmurs, rubs, or gallops,no edema  Respiratory:  clear to auscultation bilaterally, normal work of breathing GI: soft, nontender, nondistended, + BS MS: no deformity or atrophy  Skin: warm and dry Neuro:  Strength and sensation are intact Psych: euthymic mood, full affect   Preoperative evaluation: Patient has acute cholecystitis and has plans for surgery likely tomorrow.  She is currently on Eliquis.  Would agree with holding Eliquis and restart as soon as possible per general surgery.  She would be at low to intermediate risk for this intermediate risk procedure.  No further cardiac testing is necessary at this time. Persistent atrial fibrillation: Patient is currently on Eliquis.  Previous plans for cardioversion, but Sansa Alkema hold off for now.  Would restart Eliquis as soon as possible post surgery.  Shawndell Schillaci M. Williette Loewe MD 06/18/2021 4:23 PM

## 2021-06-19 ENCOUNTER — Encounter (HOSPITAL_COMMUNITY)
Admission: EM | Disposition: A | Discharge status: Home / Self Care | Payer: Self-pay | Source: Home / Self Care | Attending: Internal Medicine

## 2021-06-19 ENCOUNTER — Other Ambulatory Visit: Payer: Self-pay

## 2021-06-19 ENCOUNTER — Inpatient Hospital Stay (HOSPITAL_COMMUNITY): Payer: 59 | Admitting: Certified Registered Nurse Anesthetist

## 2021-06-19 ENCOUNTER — Inpatient Hospital Stay (HOSPITAL_COMMUNITY): Payer: 59

## 2021-06-19 ENCOUNTER — Ambulatory Visit (INDEPENDENT_AMBULATORY_CARE_PROVIDER_SITE_OTHER): Payer: 59 | Admitting: Adult Health

## 2021-06-19 ENCOUNTER — Encounter (HOSPITAL_COMMUNITY): Payer: Self-pay | Admitting: Internal Medicine

## 2021-06-19 DIAGNOSIS — K8012 Calculus of gallbladder with acute and chronic cholecystitis without obstruction: Secondary | ICD-10-CM

## 2021-06-19 DIAGNOSIS — K810 Acute cholecystitis: Secondary | ICD-10-CM | POA: Diagnosis not present

## 2021-06-19 HISTORY — PX: CHOLECYSTECTOMY: SHX55

## 2021-06-19 LAB — COMPREHENSIVE METABOLIC PANEL
ALT: 167 U/L — ABNORMAL HIGH (ref 0–44)
AST: 81 U/L — ABNORMAL HIGH (ref 15–41)
Albumin: 2.9 g/dL — ABNORMAL LOW (ref 3.5–5.0)
Alkaline Phosphatase: 73 U/L (ref 38–126)
Anion gap: 7 (ref 5–15)
BUN: 12 mg/dL (ref 8–23)
CO2: 24 mmol/L (ref 22–32)
Calcium: 8.4 mg/dL — ABNORMAL LOW (ref 8.9–10.3)
Chloride: 103 mmol/L (ref 98–111)
Creatinine, Ser: 0.88 mg/dL (ref 0.44–1.00)
GFR, Estimated: >60 mL/min (ref >60)
Glucose, Bld: 132 mg/dL — ABNORMAL HIGH (ref 70–99)
Potassium: 4.1 mmol/L (ref 3.5–5.1)
Sodium: 134 mmol/L — ABNORMAL LOW (ref 135–145)
Total Bilirubin: 1.1 mg/dL (ref 0.3–1.2)
Total Protein: 5.5 g/dL — ABNORMAL LOW (ref 6.5–8.1)

## 2021-06-19 LAB — MAGNESIUM: Magnesium: 1.7 mg/dL (ref 1.7–2.4)

## 2021-06-19 SURGERY — LAPAROSCOPIC CHOLECYSTECTOMY WITH INTRAOPERATIVE CHOLANGIOGRAM
Anesthesia: General

## 2021-06-19 MED ORDER — FENTANYL CITRATE (PF) 100 MCG/2ML IJ SOLN: 25.0000 ug | INTRAMUSCULAR | Status: DC | PRN

## 2021-06-19 MED ORDER — ROCURONIUM BROMIDE 10 MG/ML (PF) SYRINGE
PREFILLED_SYRINGE | INTRAVENOUS | Status: DC | PRN
  Administered 2021-06-19: 80 mg via INTRAVENOUS

## 2021-06-19 MED ORDER — OXYCODONE HCL 5 MG/5ML PO SOLN: 5.0000 mg | Freq: Once | ORAL | Status: DC | PRN

## 2021-06-19 MED ORDER — MIDAZOLAM HCL 2 MG/2ML IJ SOLN
INTRAMUSCULAR | Status: DC | PRN
  Administered 2021-06-19: 2 mg via INTRAVENOUS

## 2021-06-19 MED ORDER — KETOROLAC TROMETHAMINE 30 MG/ML IJ SOLN
INTRAMUSCULAR | Status: DC | PRN
  Administered 2021-06-19: 30 mg via INTRAVENOUS

## 2021-06-19 MED ORDER — OXYCODONE HCL 5 MG PO TABS: 5.0000 mg | ORAL_TABLET | Freq: Once | ORAL | Status: DC | PRN

## 2021-06-19 MED ORDER — APIXABAN 5 MG PO TABS
5.0000 mg | ORAL_TABLET | Freq: Two times a day (BID) | ORAL | Status: DC
  Filled 2021-06-19: qty 1

## 2021-06-19 MED ORDER — DEXAMETHASONE SODIUM PHOSPHATE 10 MG/ML IJ SOLN
INTRAMUSCULAR | Status: DC | PRN
  Administered 2021-06-19: 10 mg via INTRAVENOUS

## 2021-06-19 MED ORDER — ONDANSETRON HCL 4 MG/2ML IJ SOLN
INTRAMUSCULAR | Status: DC | PRN
Start: 2021-06-19 — End: 2021-06-19
  Administered 2021-06-19: 4 mg via INTRAVENOUS

## 2021-06-19 MED ORDER — ACETAMINOPHEN 160 MG/5ML PO SOLN: 1000.0000 mg | Freq: Once | ORAL | Status: DC | PRN

## 2021-06-19 MED ORDER — 0.9 % SODIUM CHLORIDE (POUR BTL) OPTIME
TOPICAL | Status: DC | PRN
  Administered 2021-06-19: 1000 mL

## 2021-06-19 MED ORDER — ORAL CARE MOUTH RINSE: 15.0000 mL | Freq: Once | OROMUCOSAL | Status: AC

## 2021-06-19 MED ORDER — HEMOSTATIC AGENTS (NO CHARGE) OPTIME
TOPICAL | Status: DC | PRN
  Administered 2021-06-19: 1 via TOPICAL

## 2021-06-19 MED ORDER — SODIUM CHLORIDE 0.9 % IR SOLN
Status: DC | PRN
  Administered 2021-06-19: 1000 mL

## 2021-06-19 MED ORDER — LIDOCAINE 2% (20 MG/ML) 5 ML SYRINGE
INTRAMUSCULAR | Status: DC | PRN
  Administered 2021-06-19: 60 mg via INTRAVENOUS

## 2021-06-19 MED ORDER — PROPOFOL 10 MG/ML IV BOLUS
INTRAVENOUS | Status: AC
  Filled 2021-06-19: qty 20

## 2021-06-19 MED ORDER — OXYCODONE HCL 5 MG PO TABS
5.0000 mg | ORAL_TABLET | ORAL | Status: DC | PRN
  Administered 2021-06-19: 10 mg via ORAL
  Filled 2021-06-19: qty 2

## 2021-06-19 MED ORDER — PHENYLEPHRINE HCL-NACL 20-0.9 MG/250ML-% IV SOLN
INTRAVENOUS | Status: DC | PRN
Start: 2021-06-19 — End: 2021-06-19
  Administered 2021-06-19: 25 ug/min via INTRAVENOUS

## 2021-06-19 MED ORDER — ACETAMINOPHEN 500 MG PO TABS: 1000.0000 mg | ORAL_TABLET | Freq: Once | ORAL | Status: DC | PRN

## 2021-06-19 MED ORDER — LACTATED RINGERS IV SOLN: INTRAVENOUS | Status: DC

## 2021-06-19 MED ORDER — ACETAMINOPHEN 10 MG/ML IV SOLN
INTRAVENOUS | Status: AC
  Filled 2021-06-19: qty 100

## 2021-06-19 MED ORDER — ACETAMINOPHEN 10 MG/ML IV SOLN
INTRAVENOUS | Status: DC | PRN
  Administered 2021-06-19: 1000 mg via INTRAVENOUS

## 2021-06-19 MED ORDER — BUPIVACAINE-EPINEPHRINE (PF) 0.25% -1:200000 IJ SOLN
INTRAMUSCULAR | Status: AC
  Filled 2021-06-19: qty 30

## 2021-06-19 MED ORDER — ACETAMINOPHEN 10 MG/ML IV SOLN: 1000.0000 mg | Freq: Once | INTRAVENOUS | Status: DC | PRN

## 2021-06-19 MED ORDER — PHENYLEPHRINE 80 MCG/ML (10ML) SYRINGE FOR IV PUSH (FOR BLOOD PRESSURE SUPPORT)
PREFILLED_SYRINGE | INTRAVENOUS | Status: DC | PRN
  Administered 2021-06-19 (×3): 80 ug via INTRAVENOUS

## 2021-06-19 MED ORDER — SUGAMMADEX SODIUM 200 MG/2ML IV SOLN
INTRAVENOUS | Status: DC | PRN
  Administered 2021-06-19: 200 mg via INTRAVENOUS

## 2021-06-19 MED ORDER — HYDROMORPHONE HCL 1 MG/ML IJ SOLN: 0.5000 mg | INTRAMUSCULAR | Status: DC | PRN

## 2021-06-19 MED ORDER — FENTANYL CITRATE (PF) 250 MCG/5ML IJ SOLN
INTRAMUSCULAR | Status: AC
  Filled 2021-06-19: qty 5

## 2021-06-19 MED ORDER — MIDAZOLAM HCL 2 MG/2ML IJ SOLN
INTRAMUSCULAR | Status: AC
  Filled 2021-06-19: qty 2

## 2021-06-19 MED ORDER — BUPIVACAINE-EPINEPHRINE 0.25% -1:200000 IJ SOLN
INTRAMUSCULAR | Status: DC | PRN
  Administered 2021-06-19: 30 mL

## 2021-06-19 MED ORDER — CHLORHEXIDINE GLUCONATE 0.12 % MT SOLN
15.0000 mL | Freq: Once | OROMUCOSAL | Status: AC
  Administered 2021-06-19: 15 mL via OROMUCOSAL
  Filled 2021-06-19: qty 15

## 2021-06-19 MED ORDER — MAGNESIUM SULFATE 2 GM/50ML IV SOLN
2.0000 g | Freq: Once | INTRAVENOUS | Status: AC
  Administered 2021-06-19: 2 g via INTRAVENOUS

## 2021-06-19 MED ORDER — PROPOFOL 10 MG/ML IV BOLUS
INTRAVENOUS | Status: DC | PRN
  Administered 2021-06-19: 160 mg via INTRAVENOUS

## 2021-06-19 MED ORDER — FENTANYL CITRATE (PF) 250 MCG/5ML IJ SOLN
INTRAMUSCULAR | Status: DC | PRN
  Administered 2021-06-19 (×5): 50 ug via INTRAVENOUS

## 2021-06-19 SURGICAL SUPPLY — 46 items
ADH SKN CLS APL DERMABOND .7 (GAUZE/BANDAGES/DRESSINGS) ×1
APL PRP STRL LF DISP 70% ISPRP (MISCELLANEOUS) ×1
APPLIER CLIP 5 13 M/L LIGAMAX5 (MISCELLANEOUS) ×2
APR CLP MED LRG 5 ANG JAW (MISCELLANEOUS) ×1
BAG COUNTER SPONGE SURGICOUNT (BAG) ×2 IMPLANT
BAG SPEC RTRVL LRG 6X4 10 (ENDOMECHANICALS) ×1
BAG SPNG CNTER NS LX DISP (BAG) ×1
CANISTER SUCT 3000ML PPV (MISCELLANEOUS) ×2 IMPLANT
CHLORAPREP W/TINT 26 (MISCELLANEOUS) ×2 IMPLANT
CLIP APPLIE 5 13 M/L LIGAMAX5 (MISCELLANEOUS) ×1 IMPLANT
CNTNR URN SCR LID CUP LEK RST (MISCELLANEOUS) ×1 IMPLANT
CONT SPEC 4OZ STRL OR WHT (MISCELLANEOUS) ×2
COVER MAYO STAND STRL (DRAPES) IMPLANT
COVER SURGICAL LIGHT HANDLE (MISCELLANEOUS) ×2 IMPLANT
DERMABOND ADVANCED (GAUZE/BANDAGES/DRESSINGS) ×1
DERMABOND ADVANCED .7 DNX12 (GAUZE/BANDAGES/DRESSINGS) ×1 IMPLANT
DRAPE C-ARM 42X120 X-RAY (DRAPES) IMPLANT
ELECT REM PT RETURN 9FT ADLT (ELECTROSURGICAL) ×2
ELECTRODE REM PT RTRN 9FT ADLT (ELECTROSURGICAL) ×1 IMPLANT
GAUZE 4X4 16PLY ~~LOC~~+RFID DBL (SPONGE) ×2 IMPLANT
GLOVE BIO SURGEON STRL SZ 6 (GLOVE) ×2 IMPLANT
GLOVE INDICATOR 6.5 STRL GRN (GLOVE) ×2 IMPLANT
GOWN STRL REUS W/ TWL LRG LVL3 (GOWN DISPOSABLE) ×3 IMPLANT
GOWN STRL REUS W/TWL LRG LVL3 (GOWN DISPOSABLE) ×6
GRASPER SUT TROCAR 14GX15 (MISCELLANEOUS) ×2 IMPLANT
HEMOSTAT SNOW SURGICEL 2X4 (HEMOSTASIS) ×1 IMPLANT
KIT BASIN OR (CUSTOM PROCEDURE TRAY) ×2 IMPLANT
KIT TURNOVER KIT B (KITS) ×2 IMPLANT
NDL INSUFFLATION 14GA 120MM (NEEDLE) ×1 IMPLANT
NEEDLE INSUFFLATION 14GA 120MM (NEEDLE) ×2 IMPLANT
NS IRRIG 1000ML POUR BTL (IV SOLUTION) ×2 IMPLANT
PAD ARMBOARD 7.5X6 YLW CONV (MISCELLANEOUS) ×2 IMPLANT
POUCH SPECIMEN RETRIEVAL 10MM (ENDOMECHANICALS) ×2 IMPLANT
SCISSORS LAP 5X35 DISP (ENDOMECHANICALS) ×2 IMPLANT
SET CHOLANGIOGRAPH 5 50 .035 (SET/KITS/TRAYS/PACK) IMPLANT
SET IRRIG TUBING LAPAROSCOPIC (IRRIGATION / IRRIGATOR) ×2 IMPLANT
SET TUBE SMOKE EVAC HIGH FLOW (TUBING) ×2 IMPLANT
SLEEVE ENDOPATH XCEL 5M (ENDOMECHANICALS) ×4 IMPLANT
SPONGE T-LAP 18X18 ~~LOC~~+RFID (SPONGE) ×2 IMPLANT
SUT MNCRL AB 4-0 PS2 18 (SUTURE) ×2 IMPLANT
TOWEL GREEN STERILE (TOWEL DISPOSABLE) ×2 IMPLANT
TOWEL GREEN STERILE FF (TOWEL DISPOSABLE) ×2 IMPLANT
TRAY LAPAROSCOPIC MC (CUSTOM PROCEDURE TRAY) ×2 IMPLANT
TROCAR XCEL NON-BLD 11X100MML (ENDOMECHANICALS) ×2 IMPLANT
TROCAR XCEL NON-BLD 5MMX100MML (ENDOMECHANICALS) ×2 IMPLANT
WATER STERILE IRR 1000ML POUR (IV SOLUTION) ×2 IMPLANT

## 2021-06-19 NOTE — Anesthesia Preprocedure Evaluation (Signed)
Anesthesia Evaluation  Patient identified by MRN, date of birth, ID band Patient awake    Reviewed: Allergy & Precautions, NPO status , Patient's Chart, lab work & pertinent test results  History of Anesthesia Complications Negative for: history of anesthetic complications  Airway Mallampati: III  TM Distance: >3 FB Neck ROM: Full    Dental  (+) Teeth Intact, Dental Advisory Given   Pulmonary neg pulmonary ROS,  Covid-19 Nucleic Acid Test Results Lab Results      Component                Value               Date                      SARSCOV2NAA              NEGATIVE            10/05/2019              breath sounds clear to auscultation       Cardiovascular hypertension, Pt. on medications and Pt. on home beta blockers + dysrhythmias Atrial Fibrillation  Rhythm:Irregular     Neuro/Psych PSYCHIATRIC DISORDERS Depression negative neurological ROS     GI/Hepatic Neg liver ROS, GERD  ,cholecystitis   Endo/Other  negative endocrine ROS  Renal/GU negative Renal ROSLab Results      Component                Value               Date                      CREATININE               0.80                10/03/2019           Lab Results      Component                Value               Date                      K                        4.5                 10/03/2019                Musculoskeletal negative musculoskeletal ROS (+)   Abdominal   Peds  Hematology negative hematology ROS (+) eliquis for afib  Lab Results      Component                Value               Date                      WBC                      8.5                 10/03/2019                HGB  14.5                10/03/2019                HCT                      43.3                10/03/2019                MCV                      89                  10/03/2019                PLT                      288                 10/03/2019               Anesthesia Other Findings   Reproductive/Obstetrics                             Anesthesia Physical Anesthesia Plan  ASA: 2  Anesthesia Plan: General   Post-op Pain Management: Toradol IV (intra-op)* and Ofirmev IV (intra-op)*   Induction: Intravenous  PONV Risk Score and Plan: 3 and Ondansetron and Dexamethasone  Airway Management Planned: Oral ETT  Additional Equipment: None  Intra-op Plan:   Post-operative Plan: Extubation in OR  Informed Consent: I have reviewed the patients History and Physical, chart, labs and discussed the procedure including the risks, benefits and alternatives for the proposed anesthesia with the patient or authorized representative who has indicated his/her understanding and acceptance.     Dental advisory given  Plan Discussed with: CRNA  Anesthesia Plan Comments:         Anesthesia Quick Evaluation

## 2021-06-19 NOTE — Transfer of Care (Signed)
Immediate Anesthesia Transfer of Care Note  Patient: Tonya Nicholson  Procedure(s) Performed: LAPAROSCOPIC CHOLECYSTECTOMY  Patient Location: PACU  Anesthesia Type:General  Level of Consciousness: drowsy  Airway & Oxygen Therapy: Patient Spontanous Breathing  Post-op Assessment: Report given to RN and Post -op Vital signs reviewed and stable  Post vital signs: Reviewed and stable  Last Vitals:  Vitals Value Taken Time  BP 99/62 06/19/21 1257  Temp 37.4 C 06/19/21 1257  Pulse 91 06/19/21 1301  Resp 13 06/19/21 1257  SpO2 92 % 06/19/21 1301  Vitals shown include unvalidated device data.  Last Pain:  Vitals:   06/19/21 1048  TempSrc:   PainSc: 0-No pain      Patients Stated Pain Goal: 2 (41/42/39 5320)  Complications: No notable events documented.

## 2021-06-19 NOTE — Patient Outreach (Addendum)
Received a hospital admission notification from [facility] insurance for Tonya Nicholson . I have assigned Deloria Lair, NP to call for follow up and determine if there are any Case Management needs.    Arville Care, [place], [facility] [phone]

## 2021-06-19 NOTE — Op Note (Signed)
Operative Note  Tonya Nicholson 61 y.o. female [id number]  06/19/2021  Surgeon: Clovis Riley MD FACS  Assistant: Nicanor Alcon MD ([place]) I was personally present and scrubbed during the key and critical portions of this procedure and immediately available throughout the entire procedure, as documented in my operative note.   Procedure performed: Laparoscopic Cholecystectomy  Procedure classification: urgent  Preop diagnosis: Acute cholecystitis Post-op diagnosis/intraop findings: same, acute on chronic cholecystitis with a large gallstone in the gallbladder neck  Specimens: gallbladder  Retained items: none  EBL: 15WC  Complications: none  Description of procedure: After confirming informed consent the patient was brought to the operating room. Antibiotics were administered. SCD's were applied. General endotracheal anesthesia was initiated and a formal time-out was performed. The abdomen was prepped and draped in the usual sterile fashion and the abdomen was entered using an infraumbilical veress needle after instilling the site with local. Insufflation to 26mHg was obtained, 582mtrocar and camera inserted, and gross inspection revealed no evidence of injury from our entry or other intraabdominal abnormalities. Two 77m46mrocars were introduced in the right midclavicular and right anterior axillary lines under direct visualization and following infiltration with local. An 49m377mocar was placed in the epigastrium.  The gallbladder is pale, thickened and tensely distended.  This was decompressed with the [place] in order to allow to be retracted.  The gallbladder fundus was retracted cephalad and the infundibulum was retracted laterally. A combination of hook electrocautery and blunt dissection was utilized to clear the peritoneum from the neck and cystic duct, circumferentially isolating the cystic artery and cystic duct and lifting the gallbladder from the cystic plate. The critical  view of safety was achieved with the cystic artery, cystic duct, and liver bed visualized between them with no other structures. The artery was clipped with 2 clips proximally and one distally and divided as was the cystic duct with three clips on the proximal end. The gallbladder was dissected from the liver plate using electrocautery.  Once freed the gallbladder was placed in an endocatch bag and removed through the epigastric trocar site. A small amount of bleeding on the liver bed was controlled with cautery.  There was also some oozing from the divided cystic duct stump, so 2 additional clips were placed on the cut edge of the cystic duct stump.  Surgicel snow was placed in the liver bed. Some bile had been spilled from the gallbladder during its dissection from the liver bed. This was aspirated and the right upper quadrant was irrigated copiously until the effluent was clear. Hemostasis was once again confirmed, and reinspection of the abdomen revealed no injuries. The clips were well opposed without any bile leak from the duct or the liver bed. The 49mm41mcar site in the epigastrium was closed with a 0 vicryl figure-of-eight in the fascia under direct visualization using a PMI device. The abdomen was desufflated and all trocars removed. The skin incisions were closed with subcuticular 4-0 monocryl and Dermabond. The patient was awakened, extubated and transported to the recovery room in stable condition.    All counts were correct at the completion of the case.

## 2021-06-19 NOTE — Progress Notes (Signed)
PROGRESS NOTE    Tonya Nicholson  DDU:[id number] DOB: [birth year] DOA: 06/17/2021 PCP: Linda Hedges, DO    Brief Narrative:   Tonya Nicholson is a 61 y.o. female with past medical history significant for chronic atrial fibrillation on Eliquis/flecainide, GERD, essential pretension, vitamin D deficiency, obesity who presented to [place] on 5/24 with complaints of abdominal pain.  Patient reports onset earlier in the day, localized to the epigastric region and radiating towards her back.  Severe in intensity and sharp in nature.  Pain is worse with movement without alleviating factors.  Now associated with nausea and nonbloody emesis.  In the ED, temperature 97.8 F, HR 81, RR 20, BP 112/97, SPO2 97% on room air.  WBC 9.3, hemoglobin 14.5, platelets 274.  Sodium 137, potassium 3.9, chloride 100, CO2 26, glucose 132, BUN 27, creatinine 1.02.  Lipase 37.  AST 15, ALT 19, total bilirubin 0.5.  Urinalysis unrevealing.  Right upper quadrant ultrasound with cholelithiasis with 18 mm gallstone impacted within the gallbladder neck, mild associated gallbladder distention consistent with acute cholecystitis.  General surgery was consulted.  Patient was transferred to [facility] and hospitalist consulted for admission for acute cholecystitis.  Assessment & Plan:   Acute cholecystitis Patient presenting with acute onset abdominal pain with right upper quadrant ultrasound findings with cholelithiasis with 18 mm gallstone impacted within the gallbladder neck, mild associated gallbladder distention.  --General surgery following, appreciate assistance --WBC 9.3>11.6 --AST 15>294>81 --ALT 19>214>167 --Tbili 0.5>1.6>1.1 --Ceftriaxone 2 g IV q24h --Metronidazole '500mg'$  IV q12h --LR at 125 mL/h --Zofran as needed --Dilaudid 0.5-1 mg IV q3h PRN moderate/severe pain --CBC/CMP daily --NPO; for laparoscopic cholecystectomy today  Permanent atrial fibrillation Patient follows with cardiology  outpatient.  Currently on flecainide and Eliquis. --Continue to hold Eliquis --Flecainide discontinued per cardiology 5/24 --Plan outpatient cardioversion, will likely need to be rescheduled for interruption of anticoagulation  GERD --Protonix 40 mg p.o. daily  Essential pretension --Amlodipine 5 mg p.o. daily --Benazepril 40 mg p.o. daily --Hydralazine 10 mg IV q6h PRN SBP >180 or DBP >110  Morbid obesity Body mass index is 36.34 kg/m.  Discussed with patient needs for aggressive lifestyle changes/weight loss as this complicates all facets of care.  Outpatient follow-up with PCP.     DVT prophylaxis: SCDs Start: 06/18/21 0329    Code Status: Full Code Family Communication: No family present at bedside this morning, daughter updated yesterday afternoon  Disposition Plan:  Level of care: Telemetry Surgical Status is: Inpatient Remains inpatient appropriate because: IV antibiotics, IV fluid hydration, pending laparoscopic cholecystectomy today    Consultants:  General surgery Cardiology  Procedures:  Right upper quadrant ultrasound  Antimicrobials:  Ceftriaxone 5/23>> Metronidazole 5/23>>   Subjective: Patient seen examined bedside, resting comfortably.  Sitting at edge of bed.  No family present.  Pending laparoscopic cholecystectomy later this morning.  Pain currently controlled.  No other specific questions or concerns at this time.  Denies headache, no chest pain, no palpitations, no shortness of breath, no fever/chills/night sweats, no current nausea/vomiting, no focal weakness, no fatigue, no paresthesias.  No acute events overnight per nursing staff.  Objective: Vitals:   06/18/21 1959 06/19/21 0625 06/19/21 0731 06/19/21 1035  BP: 107/67 (!) 108/58 139/75 (!) 125/51  Pulse: 95 99 89 96  Resp: '17 16 17 18  '$ Temp: (!) 100.6 F (38.1 C) (!) 100.6 F (38.1 C) 99.6 F (37.6 C) 98.5 F (36.9 C)  TempSrc: Oral Oral Oral Oral  SpO2: 98% 95% 97% 94%  Weight:     105.2 kg  Height:    '5\' 7"'$  (1.702 m)    Intake/Output Summary (Last 24 hours) at 06/19/2021 1136 Last data filed at 06/19/2021 0730 Gross per 24 hour  Intake 580 ml  Output --  Net 580 ml   Filed Weights   06/17/21 1903 06/19/21 1035  Weight: 111 kg 105.2 kg    Examination:  Physical Exam: GEN: NAD, alert and oriented x 3 HEENT: NCAT, PERRL, EOMI, sclera clear, MMM PULM: CTAB w/o wheezes/crackles, normal respiratory effort, on room air CV: RRR w/o M/G/R GI: abd soft, nondistended, mild epigastric/right upper quadrant tenderness to palpation, NABS, no R/G/M MSK: no peripheral edema, muscle strength globally intact 5/5 bilateral upper/lower extremities NEURO: CN II-XII intact, no focal deficits, sensation to light touch intact PSYCH: normal mood/affect Integumentary: dry/intact, no rashes or wounds    Data Reviewed: I have personally reviewed following labs and imaging studies  CBC: Recent Labs  Lab 06/17/21 1927 06/18/21 0328  WBC 9.3 11.6*  NEUTROABS  --  9.7*  HGB 14.5 14.6  HCT 44.0 42.6  MCV 89.2 88.2  PLT 274 147   Basic Metabolic Panel: Recent Labs  Lab 06/17/21 1927 06/18/21 0328 06/19/21 0030  NA 137 136 134*  K 3.9 4.3 4.1  CL 100 105 103  CO2 '26 22 24  '$ GLUCOSE 132* 144* 132*  BUN 27* 22 12  CREATININE 1.02* 0.87 0.88  CALCIUM 9.4 9.0 8.4*  MG  --  1.9 1.7   GFR: Estimated Creatinine Clearance: 83.7 mL/min (by C-G formula based on SCr of 0.88 mg/dL). Liver Function Tests: Recent Labs  Lab 06/17/21 1927 06/18/21 0328 06/19/21 0030  AST 15 294* 81*  ALT 19 214* 167*  ALKPHOS 58 82 73  BILITOT 0.5 1.6* 1.1  PROT 7.6 6.8 5.5*  ALBUMIN 4.3 3.6 2.9*   Recent Labs  Lab 06/17/21 1927  LIPASE 37   No results for input(s): AMMONIA in the last 168 hours. Coagulation Profile: Recent Labs  Lab 06/18/21 0328  INR 1.2   Cardiac Enzymes: No results for input(s): CKTOTAL, CKMB, CKMBINDEX, TROPONINI in the last 168 hours. BNP (last 3  results) No results for input(s): PROBNP in the last 8760 hours. HbA1C: Recent Labs    06/18/21 0328  HGBA1C 5.2   CBG: Recent Labs  Lab 06/18/21 0735  GLUCAP 120*   Lipid Profile: No results for input(s): CHOL, HDL, LDLCALC, TRIG, CHOLHDL, LDLDIRECT in the last 72 hours. Thyroid Function Tests: No results for input(s): TSH, T4TOTAL, FREET4, T3FREE, THYROIDAB in the last 72 hours. Anemia Panel: No results for input(s): VITAMINB12, FOLATE, FERRITIN, TIBC, IRON, RETICCTPCT in the last 72 hours. Sepsis Labs: No results for input(s): PROCALCITON, LATICACIDVEN in the last 168 hours.  Recent Results (from the past 240 hour(s))  Surgical pcr screen     Status: None   Collection Time: 06/18/21 12:43 AM   Specimen: Nasal Mucosa; Nasal Swab  Result Value Ref Range Status   MRSA, PCR NEGATIVE NEGATIVE Final   Staphylococcus aureus NEGATIVE NEGATIVE Final    Comment: (NOTE) The Xpert SA Assay (FDA approved for NASAL specimens in patients 41 years of age and older), is one component of a comprehensive surveillance program. It is not intended to diagnose infection nor to guide or monitor treatment. Performed at [facility], [place] [address] [place], [place] [address]          Radiology Studies: US Abdomen Limited RUQ (LIVER/GB)  Result Date:  06/17/2021 CLINICAL DATA:  151470.  Right upper quadrant abdominal pain, nausea EXAM: ULTRASOUND ABDOMEN LIMITED RIGHT UPPER QUADRANT COMPARISON:  None Available. FINDINGS: Gallbladder: An 18 mm gallstone is seen impacted within the gallbladder neck and the gallbladder is mildly distended. However, there is no gallbladder wall thickening, and no pericholecystic fluid is identified. The sonographic Percell Miller sign is reportedly negative. Common bile duct: Diameter: 4 mm in proximal diameter Liver: No focal lesion identified. Within normal limits in parenchymal echogenicity. Portal vein is patent on color Doppler imaging with normal direction of  blood flow towards the liver. Other: None. IMPRESSION: Cholelithiasis with 18 mm gallstone impacted within the gallbladder neck. Mild associated gallbladder distension. The findings can be seen in early changes of acute cholecystitis and correlation with liver enzymes may be helpful. Electronically Signed   By: Fidela Salisbury M.D.   On: 06/17/2021 20:40        Scheduled Meds:  [MAR Hold] amLODipine  5 mg Oral Daily   And   [MAR Hold] benazepril  40 mg Oral Daily   [MAR Hold] metoprolol tartrate  25 mg Oral BID   [MAR Hold] pantoprazole  40 mg Oral Daily   Continuous Infusions:  lactated ringers 10 mL/hr at 06/19/21 1117   [MAR Hold] metronidazole 500 mg (06/18/21 2006)     LOS: 1 day    Time spent: 49 minutes spent on chart review, discussion with nursing staff, consultants, updating family and interview/physical exam; more than 50% of that time was spent in counseling and/or coordination of care.    Mairlyn Tegtmeyer J [place] DO [facility] Available via Epic secure chat 7am-7pm After these hours, please refer to coverage provider listed on [email or web address] 06/19/2021, 11:36 AM

## 2021-06-19 NOTE — Progress Notes (Signed)
Mobility Specialist Progress Note:   06/19/21 1535  Mobility  Activity Ambulated independently in hallway  Level of Assistance Modified independent, requires aide device or extra time  Assistive Device Other (Comment) (IV Pole)  Distance Ambulated (ft) 1100 ft  Activity Response Tolerated well  $Mobility charge 1 Mobility   Pt agreeable to mobility session. Ambulated at [place] level. No pain throughout session. Left in room with all needs met.   Nelta Numbers Acute Rehab Secure Chat or Office Phone: [phone]

## 2021-06-19 NOTE — Progress Notes (Signed)
Day of Surgery   Subjective/Chief Complaint: Pain somewhat improved this AM, but still sore.    Objective: Vital signs in last 24 hours: Temp:  [98.4 F (36.9 C)-100.6 F (38.1 C)] 99.6 F (37.6 C) (05/25 0731) Pulse Rate:  [86-99] 89 (05/25 0731) Resp:  [16-17] 17 (05/25 0731) BP: (101-139)/(56-75) 139/75 (05/25 0731) SpO2:  [95 %-98 %] 97 % (05/25 0731) Last BM Date : 06/17/21  Intake/Output from previous day: 05/24 0701 - 05/25 0700 In: 630 [P.O.:630] Out: -  Intake/Output this shift: No intake/output data recorded.  A&O x 3 Unlabored respirations Abd soft, tender in RUQ/subcostal margin with voluntary guarding   Lab Results:  Recent Labs    06/17/21 1927 06/18/21 0328  WBC 9.3 11.6*  HGB 14.5 14.6  HCT 44.0 42.6  PLT 274 233   BMET Recent Labs    06/18/21 0328 06/19/21 0030  NA 136 134*  K 4.3 4.1  CL 105 103  CO2 22 24  GLUCOSE 144* 132*  BUN 22 12  CREATININE 0.87 0.88  CALCIUM 9.0 8.4*   PT/INR Recent Labs    06/18/21 0328  LABPROT 15.1  INR 1.2   ABG No results for input(s): PHART, HCO3 in the last 72 hours.  Invalid input(s): PCO2, PO2  Studies/Results: US Abdomen Limited RUQ (LIVER/GB)  Result Date: 06/17/2021 CLINICAL DATA:  151470.  Right upper quadrant abdominal pain, nausea EXAM: ULTRASOUND ABDOMEN LIMITED RIGHT UPPER QUADRANT COMPARISON:  None Available. FINDINGS: Gallbladder: An 18 mm gallstone is seen impacted within the gallbladder neck and the gallbladder is mildly distended. However, there is no gallbladder wall thickening, and no pericholecystic fluid is identified. The sonographic Percell Miller sign is reportedly negative. Common bile duct: Diameter: 4 mm in proximal diameter Liver: No focal lesion identified. Within normal limits in parenchymal echogenicity. Portal vein is patent on color Doppler imaging with normal direction of blood flow towards the liver. Other: None. IMPRESSION: Cholelithiasis with 18 mm gallstone impacted  within the gallbladder neck. Mild associated gallbladder distension. The findings can be seen in early changes of acute cholecystitis and correlation with liver enzymes may be helpful. Electronically Signed   By: Fidela Salisbury M.D.   On: 06/17/2021 20:40    Anti-infectives: Anti-infectives (From admission, onward)    Start     Dose/Rate Route Frequency Ordered Stop   06/18/21 2200  cefTRIAXone (ROCEPHIN) 2 g in sodium chloride 0.9 % 100 mL IVPB        2 g 200 mL/hr over 30 Minutes Intravenous  Once 06/18/21 0336 06/18/21 2038   06/18/21 1000  metroNIDAZOLE (FLAGYL) IVPB 500 mg        500 mg 100 mL/hr over 60 Minutes Intravenous Every 12 hours 06/18/21 0336     06/17/21 2245  metroNIDAZOLE (FLAGYL) IVPB 500 mg        500 mg 100 mL/hr over 60 Minutes Intravenous  Once 06/17/21 2230 06/18/21 0032   06/17/21 2215  cefTRIAXone (ROCEPHIN) 2 g in sodium chloride 0.9 % 100 mL IVPB        2 g 200 mL/hr over 30 Minutes Intravenous  Once 06/17/21 2204 06/17/21 2327       Assessment/Plan: Acute cholecystitis Labs improved this AM and symptoms with some improvement.  Appreciate cardiology and IM evals- Will plan for laparoscopic cholecystectomy today. Discussed risks of surgery including bleeding, pain, scarring, intraabdominal injury specifically to the common bile duct and sequelae, subtotal cholecystectomy, bile leak, conversion to open surgery, blood clot, pneumonia, heart attack,  stroke, failure to resolve symptoms, etc. Questions welcomed and answered.     LOS: 1 day    Clovis Riley 06/19/2021

## 2021-06-19 NOTE — Anesthesia Procedure Notes (Signed)
Procedure Name: Intubation Date/Time: 06/19/2021 11:34 AM Performed by: Carolan Clines, CRNA Pre-anesthesia Checklist: Patient identified, Emergency Drugs available, Suction available and Patient being monitored Patient Re-evaluated:Patient Re-evaluated prior to induction Oxygen Delivery Method: Circle System Utilized Preoxygenation: Pre-oxygenation with 100% oxygen Induction Type: IV induction Ventilation: Mask ventilation without difficulty Laryngoscope Size: Mac and 3 Grade View: Grade I Tube type: Oral Tube size: 7.0 mm Number of attempts: 1 Airway Equipment and Method: Stylet Placement Confirmation: ETT inserted through vocal cords under direct vision, positive ETCO2 and breath sounds checked- equal and bilateral Secured at: 22 cm Tube secured with: Tape Dental Injury: Teeth and Oropharynx as per pre-operative assessment

## 2021-06-20 ENCOUNTER — Other Ambulatory Visit (HOSPITAL_COMMUNITY): Payer: Self-pay

## 2021-06-20 ENCOUNTER — Encounter: Payer: 59 | Admitting: Internal Medicine

## 2021-06-20 ENCOUNTER — Other Ambulatory Visit: Payer: Self-pay | Admitting: *Deleted

## 2021-06-20 ENCOUNTER — Encounter (HOSPITAL_COMMUNITY): Payer: Self-pay | Admitting: Surgery

## 2021-06-20 DIAGNOSIS — K810 Acute cholecystitis: Secondary | ICD-10-CM | POA: Diagnosis not present

## 2021-06-20 LAB — COMPREHENSIVE METABOLIC PANEL
ALT: 105 U/L — ABNORMAL HIGH (ref 0–44)
AST: 42 U/L — ABNORMAL HIGH (ref 15–41)
Albumin: 2.7 g/dL — ABNORMAL LOW (ref 3.5–5.0)
Alkaline Phosphatase: 65 U/L (ref 38–126)
Anion gap: 6 (ref 5–15)
BUN: 13 mg/dL (ref 8–23)
CO2: 22 mmol/L (ref 22–32)
Calcium: 8.4 mg/dL — ABNORMAL LOW (ref 8.9–10.3)
Chloride: 104 mmol/L (ref 98–111)
Creatinine, Ser: 0.85 mg/dL (ref 0.44–1.00)
GFR, Estimated: >60 mL/min (ref >60)
Glucose, Bld: 174 mg/dL — ABNORMAL HIGH (ref 70–99)
Potassium: 4.2 mmol/L (ref 3.5–5.1)
Sodium: 132 mmol/L — ABNORMAL LOW (ref 135–145)
Total Bilirubin: 0.2 mg/dL — ABNORMAL LOW (ref 0.3–1.2)
Total Protein: 5.6 g/dL — ABNORMAL LOW (ref 6.5–8.1)

## 2021-06-20 LAB — CBC
HCT: 36.4 % (ref 36.0–46.0)
Hemoglobin: 12.4 g/dL (ref 12.0–15.0)
MCH: 30.5 pg (ref 26.0–34.0)
MCHC: 34.1 g/dL (ref 30.0–36.0)
MCV: 89.4 fL (ref 80.0–100.0)
Platelets: 207 10*3/uL (ref 150–400)
RBC: 4.07 MIL/uL (ref 3.87–5.11)
RDW: 12.5 % (ref 11.5–15.5)
WBC: 15.3 10*3/uL — ABNORMAL HIGH (ref 4.0–10.5)
nRBC: 0 % (ref 0.0–0.2)

## 2021-06-20 LAB — SURGICAL PATHOLOGY

## 2021-06-20 LAB — MAGNESIUM: Magnesium: 2.3 mg/dL (ref 1.7–2.4)

## 2021-06-20 MED ORDER — OXYCODONE HCL 5 MG PO TABS
5.0000 mg | ORAL_TABLET | Freq: Three times a day (TID) | ORAL | 0 refills | Status: DC | PRN
  Filled 2021-06-20: qty 10, 4d supply, fill #0

## 2021-06-20 MED ORDER — DOCUSATE SODIUM 100 MG PO CAPS
100.0000 mg | ORAL_CAPSULE | Freq: Two times a day (BID) | ORAL | 0 refills | Status: AC
  Filled 2021-06-20: qty 30, 15d supply, fill #0

## 2021-06-20 MED ORDER — APIXABAN 5 MG PO TABS: 5.0000 mg | ORAL_TABLET | Freq: Two times a day (BID) | ORAL | Status: DC

## 2021-06-20 MED ORDER — TRAMADOL HCL 50 MG PO TABS
50.0000 mg | ORAL_TABLET | Freq: Three times a day (TID) | ORAL | 0 refills | Status: DC | PRN
  Filled 2021-06-20 (×2): qty 10, 4d supply, fill #0

## 2021-06-20 NOTE — Plan of Care (Signed)

## 2021-06-20 NOTE — Discharge Summary (Signed)
Physician Discharge Summary  Tonya Nicholson NUU:[id number] DOB: [birth year] DOA: 06/17/2021  PCP: Linda Hedges, DO  Admit date: 06/17/2021 Discharge date: 06/20/2021  Admitted From: Home Disposition: Home  Recommendations for Outpatient Follow-up:  Follow up with PCP in 1-2 weeks Follow-up with general surgery Please obtain hemoglobin level in 1 week, scheduled for Tuesday per patient Holding flecainide until patient follows up with cardiology to reinitiate  Home Health: No Equipment/Devices: None  Discharge Condition: Stable CODE STATUS: Full code Diet recommendation: Heart healthy diet  History of present illness:  Tonya Nicholson is a 61 y.o. female with past medical history significant for chronic atrial fibrillation on Eliquis/flecainide, GERD, essential pretension, vitamin D deficiency, obesity who presented to [facility] on 5/24 with complaints of abdominal pain.  Patient reports onset earlier in the day, localized to the epigastric region and radiating towards her back.  Severe in intensity and sharp in nature.  Pain is worse with movement without alleviating factors.  Now associated with nausea and nonbloody emesis.   In the ED, temperature 97.8 F, HR 81, RR 20, BP 112/97, SPO2 97% on room air.  WBC 9.3, hemoglobin 14.5, platelets 274.  Sodium 137, potassium 3.9, chloride 100, CO2 26, glucose 132, BUN 27, creatinine 1.02.  Lipase 37.  AST 15, ALT 19, total bilirubin 0.5.  Urinalysis unrevealing.  Right upper quadrant ultrasound with cholelithiasis with 18 mm gallstone impacted within the gallbladder neck, mild associated gallbladder distention consistent with acute cholecystitis.  General surgery was consulted.  Patient was transferred to [facility] and hospitalist consulted for admission for acute cholecystitis.  Hospital course:  Acute cholecystitis Patient presenting with acute onset abdominal pain with right upper quadrant ultrasound findings with cholelithiasis  with 18 mm gallstone impacted within the gallbladder neck, mild associated gallbladder distention.  Patient was started on IV Novox with ceftriaxone and metronidazole.  General surgery was consulted and patient underwent laparoscopic cholecystectomy on 5/26 by Dr. Kae Heller.  Diet was advanced without further issue postoperatively.  Outpatient follow-up with general surgery.   Permanent atrial fibrillation Patient follows with cardiology outpatient.  Currently on flecainide and Eliquis.  Flecainide and Eliquis were hold preoperatively.  Will resume Eliquis on discharge.  Continue to hold flecainide until outpatient follow-up with cardiology.    Essential pretension Continue amlodipine 5 mg p.o. daily and Benazepril 40 mg p.o. daily   Morbid obesity Body mass index is 36.34 kg/m.  Discussed with patient needs for aggressive lifestyle changes/weight loss as this complicates all facets of care.  Outpatient follow-up with PCP  Discharge Diagnoses:  Principal Problem:   Acute cholecystitis Active Problems:   Atrial fibrillation, chronic (HCC)   Essential hypertension   GERD without esophagitis   Prediabetes   Class 2 severe obesity due to excess calories with serious comorbidity and body mass index (BMI) of 38.0 to 38.9 in adult [facility])    Discharge Instructions  Discharge Instructions     Diet - low sodium heart healthy   Complete by: As directed    Increase activity slowly   Complete by: As directed    No wound care   Complete by: As directed       Allergies as of 06/20/2021   No Known Allergies      Medication List     STOP taking these medications    flecainide 100 MG tablet Commonly known as: TAMBOCOR       TAKE these medications    amLODipine-benazepril 5-40 MG capsule Commonly known as:  LOTREL Take 1 capsule by mouth daily.   docusate sodium 100 MG capsule Commonly known as: Colace Take 1 capsule (100 mg total) by mouth 2 (two) times daily. Okay to decrease to  once daily or stop taking if having loose bowel movements   Eliquis 5 MG Tabs tablet Generic drug: apixaban Take 1 tablet (5 mg total) by mouth 2 (two) times daily.   metoprolol tartrate 25 MG tablet Commonly known as: LOPRESSOR TAKE 1 TABLET (25 MG TOTAL) BY MOUTH 2 (TWO) TIMES DAILY.   traMADol 50 MG tablet Commonly known as: Ultram Take 1 tablet (50 mg total) by mouth every 8 (eight) hours as needed for moderate pain.   Vitamin D (Ergocalciferol) 1.25 MG (50000 UNIT) Caps capsule Commonly known as: DRISDOL Take 1 capsule (50,000 Units total) by mouth every 7 (seven) days. What changed: additional instructions   Wegovy 1 MG/0.5ML Soaj Generic drug: Semaglutide-Weight Management Inject 1 mg into the skin once a week. What changed: additional instructions        Follow-up Information     Surgery, [place] Follow up.   Specialty: General Surgery Contact information: [address] [place] [address] [phone]         Linda Hedges, DO. Schedule an appointment as soon as possible for a visit in 1 week(s).   Specialty: Obstetrics and Gynecology Contact information: [address], [place], [place] [address] [phone]         Constance Haw, MD .   Specialty: Cardiology Contact information: [place] [address] [phone]                No Known Allergies  Consultations: General surgery, Dr. Kae Heller Cardiology, Dr. Curt Bears   Procedures/Studies: US Abdomen Limited RUQ (LIVER/GB)  Result Date: 06/17/2021 CLINICAL DATA:  [id number].  Right upper quadrant abdominal pain, nausea EXAM: ULTRASOUND ABDOMEN LIMITED RIGHT UPPER QUADRANT COMPARISON:  None Available. FINDINGS: Gallbladder: An 18 mm gallstone is seen impacted within the gallbladder neck and the gallbladder is mildly distended. However, there is no gallbladder wall thickening, and no pericholecystic  fluid is identified. The sonographic Percell Miller sign is reportedly negative. Common bile duct: Diameter: 4 mm in proximal diameter Liver: No focal lesion identified. Within normal limits in parenchymal echogenicity. Portal vein is patent on color Doppler imaging with normal direction of blood flow towards the liver. Other: None. IMPRESSION: Cholelithiasis with 18 mm gallstone impacted within the gallbladder neck. Mild associated gallbladder distension. The findings can be seen in early changes of acute cholecystitis and correlation with liver enzymes may be helpful. Electronically Signed   By: Fidela Salisbury M.D.   On: 06/17/2021 20:40     Subjective: Patient seen examined bedside, resting comfortably.  Daughter present.  Patient reports some mild abdominal soreness, otherwise no complaints this morning and ready for discharge home.  Denies headache, no dizziness, no chest pain, no palpitations, no fever/chills/night sweats, no nausea/vomiting/diarrhea, no cough/congestion, no focal weakness, no fatigue, no paresthesias.  No acute events overnight per staff.  Discharge Exam: Vitals:   06/20/21 0600 06/20/21 0922  BP: (!) 122/93 115/68  Pulse: (!) 104 66  Resp: 20 18  Temp: 97.7 F (36.5 C) 97.7 F (36.5 C)  SpO2: 95% 97%   Vitals:   06/19/21 2110 06/20/21 0125 06/20/21 0600 06/20/21 0922  BP: (!) 98/59 (!) 120/58 (!) 122/93 115/68  Pulse: 95 (!) 101 (!) 104 66  Resp: '18 16 20 18  '$ Temp: 98.2 F (36.8 C) 98.3 F (36.8 C) 97.7 F (36.5 C) 97.7 F (36.5 C)  TempSrc: Oral Oral Oral Oral  SpO2: 97% 97% 95% 97%  Weight:      Height:        Physical Exam: GEN: NAD, alert and oriented x 3, obese HEENT: NCAT, PERRL, EOMI, sclera clear, MMM PULM: CTAB w/o wheezes/crackles, normal respiratory effort, on room air CV: RRR w/o M/G/R GI: abd soft, nondistended, incision sites noted without any concerning erythema/fluctuance, NABS, no R/G/M MSK: no peripheral edema, muscle strength globally intact  5/5 bilateral upper/lower extremities NEURO: CN II-XII intact, no focal deficits, sensation to light touch intact PSYCH: normal mood/affect Integumentary: dry/intact, no rashes or wounds    The results of significant diagnostics from this hospitalization (including imaging, microbiology, ancillary and laboratory) are listed below for reference.     Microbiology: Recent Results (from the past 240 hour(s))  Surgical pcr screen     Status: None   Collection Time: 06/18/21 12:43 AM   Specimen: Nasal Mucosa; Nasal Swab  Result Value Ref Range Status   MRSA, PCR NEGATIVE NEGATIVE Final   Staphylococcus aureus NEGATIVE NEGATIVE Final    Comment: (NOTE) The Xpert SA Assay (FDA approved for NASAL specimens in patients 58 years of age and older), is one component of a comprehensive surveillance program. It is not intended to diagnose infection nor to guide or monitor treatment. Performed at [facility], [place] [address], [place], [place] [address]      Labs: BNP (last 3 results) No results for input(s): BNP in the last 8760 hours. Basic Metabolic Panel: Recent Labs  Lab 06/17/21 1927 06/18/21 0328 06/19/21 0030 06/20/21 0324  NA 137 136 134* 132*  K 3.9 4.3 4.1 4.2  CL 100 105 103 104  CO2 '26 22 24 22  '$ GLUCOSE 132* 144* 132* 174*  BUN 27* '22 12 13  '$ CREATININE 1.02* 0.87 0.88 0.85  CALCIUM 9.4 9.0 8.4* 8.4*  MG  --  1.9 1.7 2.3   Liver Function Tests: Recent Labs  Lab 06/17/21 1927 06/18/21 0328 06/19/21 0030 06/20/21 0324  AST 15 294* 81* 42*  ALT 19 214* 167* 105*  ALKPHOS 58 82 73 65  BILITOT 0.5 1.6* 1.1 0.2*  PROT 7.6 6.8 5.5* 5.6*  ALBUMIN 4.3 3.6 2.9* 2.7*   Recent Labs  Lab 06/17/21 1927  LIPASE 37   No results for input(s): AMMONIA in the last 168 hours. CBC: Recent Labs  Lab 06/17/21 1927 06/18/21 0328 06/20/21 0324  WBC 9.3 11.6* 15.3*  NEUTROABS  --  9.7*  --   HGB 14.5 14.6 12.4  HCT 44.0 42.6 36.4  MCV 89.2 88.2 89.4  PLT 274 233  207   Cardiac Enzymes: No results for input(s): CKTOTAL, CKMB, CKMBINDEX, TROPONINI in the last 168 hours. BNP: Invalid input(s): POCBNP CBG: Recent Labs  Lab 06/18/21 0735  GLUCAP 120*   D-Dimer No results for input(s): DDIMER in the last 72 hours. Hgb A1c Recent Labs    06/18/21 0328  HGBA1C 5.2   Lipid Profile No results for input(s): CHOL, HDL, LDLCALC, TRIG, CHOLHDL, LDLDIRECT in the last 72 hours. Thyroid function studies No results for input(s): TSH, T4TOTAL, T3FREE, THYROIDAB in the last 72 hours.  Invalid input(s): FREET3 Anemia work up No results for input(s): VITAMINB12, FOLATE, FERRITIN, TIBC, IRON, RETICCTPCT in the last 72 hours. Urinalysis    Component Value Date/Time   COLORURINE YELLOW  06/17/2021 1920   APPEARANCEUR HAZY (A) 06/17/2021 1920   LABSPEC 1.033 (H) 06/17/2021 1920   PHURINE 5.0 06/17/2021 1920   GLUCOSEU NEGATIVE 06/17/2021 1920   HGBUR NEGATIVE 06/17/2021 1920   BILIRUBINUR NEGATIVE 06/17/2021 1920   KETONESUR NEGATIVE 06/17/2021 1920   PROTEINUR TRACE (A) 06/17/2021 1920   NITRITE NEGATIVE 06/17/2021 1920   LEUKOCYTESUR NEGATIVE 06/17/2021 1920   Sepsis Labs Invalid input(s): PROCALCITONIN,  WBC,  LACTICIDVEN Microbiology Recent Results (from the past 240 hour(s))  Surgical pcr screen     Status: None   Collection Time: 06/18/21 12:43 AM   Specimen: Nasal Mucosa; Nasal Swab  Result Value Ref Range Status   MRSA, PCR NEGATIVE NEGATIVE Final   Staphylococcus aureus NEGATIVE NEGATIVE Final    Comment: (NOTE) The Xpert SA Assay (FDA approved for NASAL specimens in patients 37 years of age and older), is one component of a comprehensive surveillance program. It is not intended to diagnose infection nor to guide or monitor treatment. Performed at [facility], [place] [address], [place], [place] [address]      Time coordinating discharge: Over 30 minutes  SIGNED:   Zohra Clavel J [place] DO  [facility] 06/20/2021, 9:25  AM

## 2021-06-20 NOTE — Progress Notes (Signed)
1 Day Post-Op   Subjective/Chief Complaint: Some soreness as expected, but feels better than preop. Moving around well, tolerating PO. Some flatus, no bm   Objective: Vital signs in last 24 hours: Temp:  [97.7 F (36.5 C)-99.3 F (37.4 C)] 97.7 F (36.5 C) (05/26 0600) Pulse Rate:  [95-104] 104 (05/26 0600) Resp:  [13-20] 20 (05/26 0600) BP: (98-125)/(46-93) 122/93 (05/26 0600) SpO2:  [92 %-98 %] 95 % (05/26 0600) Weight:  [105.2 kg] 105.2 kg (05/25 1035) Last BM Date : 06/17/21  Intake/Output from previous day: 05/25 0701 - 05/26 0700 In: 1310 [P.O.:360; I.V.:700; IV Piggyback:200] Out: 25 [Blood:25] Intake/Output this shift: No intake/output data recorded.  A&O x 3 Unlabored respirations Abd soft, nondistended, incisions c/d/I with evolving ecchymosis extending about 6-7cm inferolateral from epigastric/extraction port site, but no hematoma, no cellulitis   Lab Results:  Recent Labs    06/18/21 0328 06/20/21 0324  WBC 11.6* 15.3*  HGB 14.6 12.4  HCT 42.6 36.4  PLT 233 207    BMET Recent Labs    06/19/21 0030 06/20/21 0324  NA 134* 132*  K 4.1 4.2  CL 103 104  CO2 24 22  GLUCOSE 132* 174*  BUN 12 13  CREATININE 0.88 0.85  CALCIUM 8.4* 8.4*    PT/INR Recent Labs    06/18/21 0328  LABPROT 15.1  INR 1.2    ABG No results for input(s): PHART, HCO3 in the last 72 hours.  Invalid input(s): PCO2, PO2  Studies/Results: No results found.  Anti-infectives: Anti-infectives (From admission, onward)    Start     Dose/Rate Route Frequency Ordered Stop   06/18/21 2200  cefTRIAXone (ROCEPHIN) 2 g in sodium chloride 0.9 % 100 mL IVPB        2 g 200 mL/hr over 30 Minutes Intravenous  Once 06/18/21 0336 06/18/21 2038   06/18/21 1000  metroNIDAZOLE (FLAGYL) IVPB 500 mg  Status:  Discontinued        500 mg 100 mL/hr over 60 Minutes Intravenous Every 12 hours 06/18/21 0336 06/19/21 1436   06/17/21 2245  metroNIDAZOLE (FLAGYL) IVPB 500 mg        500  mg 100 mL/hr over 60 Minutes Intravenous  Once 06/17/21 2230 06/18/21 0032   06/17/21 2215  cefTRIAXone (ROCEPHIN) 2 g in sodium chloride 0.9 % 100 mL IVPB        2 g 200 mL/hr over 30 Minutes Intravenous  Once 06/17/21 2204 06/17/21 2327       Assessment/Plan: Acute cholecystitis POD 1 s/p lap chole LFTs continue downtrending. Expected post-op leukocytosis, hgb down a bit this AM but clinically no suspicion of ongoing/active bleeding.  Appreciate cardiology and IM evals- OK for discharge from surgery standpoint. If acceptable would resume eliquis this evening, recheck hgb as outpatient next week or sooner if concerning s/s which patient is well aware of.     LOS: 2 days    Tonya Nicholson 06/20/2021

## 2021-06-20 NOTE — Anesthesia Postprocedure Evaluation (Signed)
Anesthesia Post Note  Patient: Tonya Nicholson  Procedure(s) Performed: LAPAROSCOPIC CHOLECYSTECTOMY     Patient location during evaluation: PACU Anesthesia Type: General Level of consciousness: awake and alert Pain management: pain level controlled Vital Signs Assessment: post-procedure vital signs reviewed and stable Respiratory status: spontaneous breathing, nonlabored ventilation and respiratory function stable Cardiovascular status: blood pressure returned to baseline and stable Postop Assessment: no apparent nausea or vomiting Anesthetic complications: no   No notable events documented.  Last Vitals:  Vitals:   06/20/21 0600 06/20/21 0922  BP: (!) 122/93 115/68  Pulse: (!) 104 66  Resp: 20 18  Temp: 36.5 C 36.5 C  SpO2: 95% 97%    Last Pain:  Vitals:   06/20/21 0922  TempSrc: Oral  PainSc:                  [name]

## 2021-06-20 NOTE — Patient Outreach (Addendum)
Home [facility]) Care Management  06/20/2021  Tonya Nicholson November 25, 1960 [id number]  Initial telephone outreach for [place] management. Pt discharged today, post cholecystectomy.   Talked with [name] and she gave permission for [place] Management services. Transition of care call completed. Pt has follow up appts and her medications. Her nurse daughter is with her so she has appropriate post op support. She understands her discharge instructions.  We agreed to talk again in one week.   [name]. Myrtie Neither, MSN, [facility] Gerontological Nurse Practitioner [facility] Care Management [phone]

## 2021-06-24 ENCOUNTER — Other Ambulatory Visit: Payer: Self-pay | Admitting: *Deleted

## 2021-06-25 ENCOUNTER — Other Ambulatory Visit: Payer: 59 | Admitting: *Deleted

## 2021-06-25 DIAGNOSIS — I4819 Other persistent atrial fibrillation: Secondary | ICD-10-CM | POA: Diagnosis not present

## 2021-06-25 DIAGNOSIS — Z01812 Encounter for preprocedural laboratory examination: Secondary | ICD-10-CM

## 2021-06-25 LAB — BASIC METABOLIC PANEL
BUN/Creatinine Ratio: 15 (ref 12–28)
BUN: 13 mg/dL (ref 8–27)
CO2: 29 mmol/L (ref 20–29)
Calcium: 9.2 mg/dL (ref 8.7–10.3)
Chloride: 104 mmol/L (ref 96–106)
Creatinine, Ser: 0.88 mg/dL (ref 0.57–1.00)
Glucose: 106 mg/dL — ABNORMAL HIGH (ref 70–99)
Potassium: 4.3 mmol/L (ref 3.5–5.2)
Sodium: 140 mmol/L (ref 134–144)
eGFR: 75 mL/min/{1.73_m2} (ref >59)

## 2021-06-25 LAB — CBC
Hematocrit: 39.9 % (ref 34.0–46.6)
Hemoglobin: 13.3 g/dL (ref 11.1–15.9)
MCH: 29.9 pg (ref 26.6–33.0)
MCHC: 33.3 g/dL (ref 31.5–35.7)
MCV: 90 fL (ref 79–97)
Platelets: 318 10*3/uL (ref 150–450)
RBC: 4.45 x10E6/uL (ref 3.77–5.28)
RDW: 13.2 % (ref 11.7–15.4)
WBC: 8.9 10*3/uL (ref 3.4–10.8)

## 2021-06-26 ENCOUNTER — Other Ambulatory Visit: Payer: Self-pay | Admitting: *Deleted

## 2021-06-26 ENCOUNTER — Ambulatory Visit (INDEPENDENT_AMBULATORY_CARE_PROVIDER_SITE_OTHER): Payer: 59 | Admitting: Adult Health

## 2021-06-26 ENCOUNTER — Encounter (INDEPENDENT_AMBULATORY_CARE_PROVIDER_SITE_OTHER): Payer: Self-pay | Admitting: Adult Health

## 2021-06-26 ENCOUNTER — Other Ambulatory Visit (HOSPITAL_COMMUNITY): Payer: Self-pay

## 2021-06-26 ENCOUNTER — Other Ambulatory Visit: Payer: Self-pay | Admitting: Cardiology

## 2021-06-26 VITALS — BP 98/61 | HR 74 | Temp 97.7°F | Ht 67.0 in | Wt 235.0 lb

## 2021-06-26 DIAGNOSIS — Z6836 Body mass index (BMI) 36.0-36.9, adult: Secondary | ICD-10-CM

## 2021-06-26 DIAGNOSIS — E669 Obesity, unspecified: Secondary | ICD-10-CM

## 2021-06-26 DIAGNOSIS — Z9049 Acquired absence of other specified parts of digestive tract: Secondary | ICD-10-CM | POA: Diagnosis not present

## 2021-06-26 DIAGNOSIS — E559 Vitamin D deficiency, unspecified: Secondary | ICD-10-CM

## 2021-06-26 MED ORDER — AMLODIPINE BESY-BENAZEPRIL HCL 5-40 MG PO CAPS
1.0000 | ORAL_CAPSULE | Freq: Every day | ORAL | 0 refills | Status: DC
  Filled 2021-06-26: qty 90, 90d supply, fill #0

## 2021-06-26 MED ORDER — APIXABAN 5 MG PO TABS
5.0000 mg | ORAL_TABLET | Freq: Two times a day (BID) | ORAL | 1 refills | Status: DC
  Filled 2021-06-26: qty 180, 90d supply, fill #0
  Filled 2021-09-19: qty 180, 90d supply, fill #1

## 2021-06-26 MED ORDER — VITAMIN D (ERGOCALCIFEROL) 1.25 MG (50000 UNIT) PO CAPS
50000.0000 [IU] | ORAL_CAPSULE | ORAL | 0 refills | Status: DC
  Filled 2021-06-26: qty 4, 28d supply, fill #0

## 2021-06-26 NOTE — Telephone Encounter (Signed)
Prescription refill request for Eliquis received. Indication:  Atrial Fib Last office visit: 05/29/21  Elliot Cousin MD Scr: 0.88 on 06/25/21 Age: 61  Weight: 111kg  Based on above findings Eliquis 5ng twice daily is the appropriate dose.  Refill approved.

## 2021-06-26 NOTE — Patient Outreach (Signed)
[facility]) Care Management  06/26/2021  Tonya Nicholson 11/16/1960 [id number]  Telephone outreach for transition of care attempted, was unsuccessful, left message for return call.  [name]. Myrtie Neither, MSN, [facility] Gerontological Nurse Practitioner [facility] Care Management [phone]

## 2021-06-27 ENCOUNTER — Ambulatory Visit (HOSPITAL_COMMUNITY): Admit: 2021-06-27 | Payer: Commercial Managed Care - PPO | Admitting: Cardiology

## 2021-06-27 SURGERY — CARDIOVERSION
Anesthesia: General

## 2021-07-02 NOTE — Progress Notes (Signed)
Chief Complaint:   OBESITY Tonya Nicholson is here to discuss her progress with her obesity treatment plan along with follow-up of her obesity related diagnoses. Tonya Nicholson is on the Category 2 Plan and states she is following her eating plan approximately 40% of the time. Tonya Nicholson states she is not currently exercising  Today's visit was #: 30 Starting weight: 263 lbs Starting date: 02/24/2018 Today's weight: 235 lbs Today's date: 06/26/21 Total lbs lost to date: 28 lbs Total lbs lost since last in-office visit: -3  Interim History:  She underwent cholecystectomy 06/20/2021. She held her Wegovy 1 mg injection 06/19/2021, she resume 1 mg injection today. Per patient per [facility] pharmacy-Wegovy backorder until September 2023.  Subjective:   1. S/P cholecystectomy 06/20/21 Hospital D/C notes: History of present illness: Tonya Nicholson is a 61 y.o. female with past medical history significant for chronic atrial fibrillation on Eliquis/flecainide, GERD, essential pretension, vitamin D deficiency, obesity who presented to [facility] on 5/24 with complaints of abdominal pain.  Patient reports onset earlier in the day, localized to the epigastric region and radiating towards her back.  Severe in intensity and sharp in nature.  Pain is worse with movement without alleviating factors.  Now associated with nausea and nonbloody emesis. Acute cholecystitis Patient presenting with acute onset abdominal pain with right upper quadrant ultrasound findings with cholelithiasis with 18 mm gallstone impacted within the gallbladder neck, mild associated gallbladder distention.  Patient was started on IV Novox with ceftriaxone and metronidazole.  General surgery was consulted and patient underwent laparoscopic cholecystectomy on 5/26 by Dr. Kae Heller.  Diet was advanced without further issue postoperatively.  Outpatient follow-up with general surgery.  2. Vitamin D deficiency She is on Ergocalciferol- denies N/V/Muscle  Weakness  Assessment/Plan:   1. S/P cholecystectomy Follow-up with surgeon as directed.  2. Vitamin D deficiency Refill - Vitamin D, Ergocalciferol, (DRISDOL) 1.25 MG (50000 UNIT) CAPS capsule; Take 1 capsule (50,000 Units total) by mouth every 7 (seven) days.  Dispense: 4 capsule; Refill: 0  3. Obesity with current BMI 36.9 Tonya Nicholson is currently in the action stage of change. As such, her goal is to continue with weight loss efforts. She has agreed to the Category 2 Plan.   Exercise goals: Activity as tolerated.  Behavioral modification strategies: increasing lean protein intake, decreasing simple carbohydrates, meal planning and cooking strategies, keeping healthy foods in the home, and planning for success.  Tonya Nicholson has agreed to follow-up with our clinic in 3 weeks. She was informed of the importance of frequent follow-up visits to maximize her success with intensive lifestyle modifications for her multiple health conditions.   Objective:   Blood pressure 98/61, pulse 74, temperature 97.7 F (36.5 C), height '5\' 7"'$  (1.702 m), weight 235 lb (106.6 kg), last menstrual period 04/09/2016, SpO2 99 %. Body mass index is 36.81 kg/m.  General: Cooperative, alert, well developed, in no acute distress. HEENT: Conjunctivae and lids unremarkable. Cardiovascular: Regular rhythm.  Lungs: Normal work of breathing. Neurologic: No focal deficits.   Lab Results  Component Value Date   CREATININE 0.88 06/25/2021   BUN 13 06/25/2021   NA 140 06/25/2021   K 4.3 06/25/2021   CL 104 06/25/2021   CO2 29 06/25/2021   Lab Results  Component Value Date   ALT 105 (H) 06/20/2021   AST 42 (H) 06/20/2021   ALKPHOS 65 06/20/2021   BILITOT 0.2 (L) 06/20/2021   Lab Results  Component Value Date   HGBA1C 5.2 06/18/2021  HGBA1C 5.3 04/10/2021   HGBA1C 6.0 (H) 09/26/2020   HGBA1C 6.0 (H) 04/25/2020   HGBA1C 5.7 (H) 06/29/2019   Lab Results  Component Value Date   INSULIN 12.8 04/10/2021    INSULIN 11.0 09/26/2020   INSULIN 9.4 04/25/2020   INSULIN 14.5 06/29/2019   INSULIN 8.2 01/31/2019   Lab Results  Component Value Date   TSH 1.410 02/24/2018   Lab Results  Component Value Date   CHOL 166 04/25/2020   HDL 54 04/25/2020   LDLCALC 98 04/25/2020   TRIG 71 04/25/2020   CHOLHDL 3.1 04/25/2020   Lab Results  Component Value Date   VD25OH 58.6 04/10/2021   VD25OH 37.6 09/26/2020   VD25OH 34.1 04/25/2020   Lab Results  Component Value Date   WBC 8.9 06/25/2021   HGB 13.3 06/25/2021   HCT 39.9 06/25/2021   MCV 90 06/25/2021   PLT 318 06/25/2021   No results found for: IRON, TIBC, FERRITIN  Attestation Statements:   Reviewed by clinician on day of visit: allergies, medications, problem list, medical history, surgical history, family history, social history, and previous encounter notes.  I, Georgianne Fick, FNP, am acting as Location manager for Mina Marble, NP.  I have reviewed the above documentation for accuracy and completeness, and I agree with the above. -  Edrie Ehrich d. Kyland No, NP-C

## 2021-07-03 DIAGNOSIS — Z9049 Acquired absence of other specified parts of digestive tract: Secondary | ICD-10-CM | POA: Insufficient documentation

## 2021-07-10 ENCOUNTER — Ambulatory Visit: Payer: 59 | Admitting: Physician Assistant

## 2021-07-11 ENCOUNTER — Other Ambulatory Visit (HOSPITAL_COMMUNITY): Payer: Self-pay

## 2021-07-11 ENCOUNTER — Ambulatory Visit (INDEPENDENT_AMBULATORY_CARE_PROVIDER_SITE_OTHER): Payer: 59 | Admitting: Physician Assistant

## 2021-07-11 ENCOUNTER — Encounter: Payer: Self-pay | Admitting: Physician Assistant

## 2021-07-11 VITALS — BP 106/66 | HR 78 | Ht 67.0 in | Wt 238.0 lb

## 2021-07-11 DIAGNOSIS — Z01818 Encounter for other preprocedural examination: Secondary | ICD-10-CM | POA: Diagnosis not present

## 2021-07-11 DIAGNOSIS — I4819 Other persistent atrial fibrillation: Secondary | ICD-10-CM | POA: Diagnosis not present

## 2021-07-11 MED ORDER — FLECAINIDE ACETATE 100 MG PO TABS
100.0000 mg | ORAL_TABLET | Freq: Two times a day (BID) | ORAL | 3 refills | Status: DC
  Filled 2021-07-11 – 2021-08-19 (×2): qty 180, 90d supply, fill #0
  Filled 2021-11-26: qty 180, 90d supply, fill #1
  Filled 2022-02-20: qty 180, 90d supply, fill #2
  Filled 2022-05-19 – 2022-05-20 (×2): qty 180, 90d supply, fill #3

## 2021-07-11 NOTE — Patient Instructions (Signed)
Medication Instructions:    STARTING  TOMORROW  6- 17  START TAKING FLECAINIDE 100 MG TWICE    *If you need a refill on your cardiac medications before your next appointment, please call your pharmacy*   Lab Work: CBC AND BMET  TODAY    If you have labs (blood work) drawn today and your tests are completely normal, you will receive your results only by: [place] (if you have MyChart) OR A paper copy in the mail If you have any lab test that is abnormal or we need to change your treatment, we will call you to review the results.   Testing/Procedures: Your physician has recommended that you have a Cardioversion (DCCV). Electrical Cardioversion uses a jolt of electricity to your heart either through paddles or wired patches attached to your chest. This is a controlled, usually prescheduled, procedure. Defibrillation is done under light anesthesia in the hospital, and you usually go home the day of the procedure. This is done to get your heart back into a normal rhythm. You are not awake for the procedure. Please see the instruction sheet given to you today.    Follow-Up: At [facility], you and your health needs are our priority.  As part of our continuing mission to provide you with exceptional heart care, we have created designated Provider Care Teams.  These Care Teams include your primary Cardiologist (physician) and Advanced Practice Providers (APPs -  Physician Assistants and Nurse Practitioners) who all work together to provide you with the care you need, when you need it.  We recommend signing up for the patient portal called "MyChart".  Sign up information is provided on this After Visit Summary.  MyChart is used to connect with patients for Virtual Visits (Telemedicine).  Patients are able to view lab/test results, encounter notes, upcoming appointments, etc.  Non-urgent messages can be sent to your provider as well.   To learn more about what you can do with MyChart, go to  [email or web address].     Your next appointment: YOUR FOLLOW UP WILL BE DETERMINED AFTER CARDIOVERSION    Other Instructions   Important Information About Sugar

## 2021-07-11 NOTE — H&P (View-Only) (Signed)
Cardiology Office Note Date:  07/11/2021  Patient ID:  Tonya Nicholson, DOB [birth year], MRN [id number] PCP:  Linda Hedges, DO  Electrophysiologist: Dr. Curt Bears   Chief Complaint:  post hospital  History of Present Illness: Tonya Nicholson is a 61 y.o. female with history of HTN, Obesity, Afib, GERD.  She was planned to have DCCV recently with Dr. Curt Bears though developed acute cholecystitis and cancelled. She was admitted with fairly rapid onset belly symptoms > pain found with acute cholecystitis, started on abx, her Eliquis held for surgery. We saw her, held her flecainide will off a/c given she was still in AF. Planned to see her 3 weeks back on a/c to resume flecainide and plan DCCV Discharged 06/20/21  TODAY She is doing well. Largely unaware of her AFib No CP, palpitations She feels like she is doing very well post-op, healed up and feeling well Back on her Eliquis,no missed doses, this evening will be 3 weeks of uninterrupted [name]. No bleeding or signs of bleeding  AFib/AAD hx Diagnosed 2020 Flecainide started looks like Sep 2021  Past Medical History:  Diagnosis Date   Dyspnea    GERD (gastroesophageal reflux disease)    Hypertension    Joint pain    Knee pain    Lower extremity edema     Past Surgical History:  Procedure Laterality Date   CARDIOVERSION N/A 03/21/2018   Procedure: CARDIOVERSION;  Surgeon: Sueanne Margarita, MD;  Location: [place];  Service: Cardiovascular;  Laterality: N/A;   CARDIOVERSION N/A 03/31/2018   Procedure: CARDIOVERSION;  Surgeon: Constance Haw, MD;  Location: [place];  Service: Cardiovascular;  Laterality: N/A;   CARDIOVERSION N/A 10/06/2019   Procedure: CARDIOVERSION;  Surgeon: Constance Haw, MD;  Location: [place];  Service: Cardiovascular;  Laterality: N/A;   CHOLECYSTECTOMY N/A 06/19/2021   Procedure: LAPAROSCOPIC CHOLECYSTECTOMY;  Surgeon: Clovis Riley, MD;  Location: [place];  Service: General;   Laterality: N/A;    Current Outpatient Medications  Medication Sig Dispense Refill   flecainide (TAMBOCOR) 100 MG tablet Take 1 tablet (100 mg total) by mouth 2 (two) times daily. 180 tablet 3   amLODipine-benazepril (LOTREL) 5-40 MG capsule Take 1 capsule by mouth daily. 90 capsule 0   apixaban (ELIQUIS) 5 MG TABS tablet Take 1 tablet (5 mg total) by mouth 2 (two) times daily. 180 tablet 1   docusate sodium (COLACE) 100 MG capsule Take 1 capsule (100 mg total) by mouth 2 (two) times daily. Okay to decrease to once daily or stop taking if having loose bowel movements 30 capsule 0   metoprolol tartrate (LOPRESSOR) 25 MG tablet TAKE 1 TABLET (25 MG TOTAL) BY MOUTH 2 (TWO) TIMES DAILY. 180 tablet 1   Semaglutide-Weight Management 1 MG/0.5ML SOAJ Inject 1 mg into the skin once a week. (Patient taking differently: Inject 1 mg into the skin once a week. Thursday) 9 mL 0   traMADol (ULTRAM) 50 MG tablet Take 1 tablet (50 mg total) by mouth every 8 (eight) hours as needed for moderate pain. 10 tablet 0   Vitamin D, Ergocalciferol, (DRISDOL) 1.25 MG (50000 UNIT) CAPS capsule Take 1 capsule (50,000 Units total) by mouth every 7 (seven) days. 4 capsule 0   No current facility-administered medications for this visit.    Allergies:   Patient has no known allergies.   Social History:  The patient  reports that she has never smoked. She has never used smokeless tobacco. She reports that she does not  currently use alcohol after a past usage of about 9.0 standard drinks of alcohol per week. She reports that she does not use drugs.   Family History:  The patient's family history includes Atrial fibrillation in her mother; Bladder Cancer in her father; Diabetes in her brother, father, and mother; Healthy in her sister; Heart attack in her brother; Heart disease in her father; Hypertension in her father and mother.  ROS:  Please see the history of present illness.    All other systems are reviewed and otherwise  negative.   PHYSICAL EXAM:  VS:  BP 106/66   Pulse 78   Ht '5\' 7"'$  (1.702 m)   Wt 238 lb (108 kg)   LMP 04/09/2016 (Approximate)   SpO2 97%   BMI 37.28 kg/m  BMI: Body mass index is 37.28 kg/m. Well nourished, well developed, in no acute distress HEENT: normocephalic, atraumatic Neck: no JVD, carotid bruits or masses Cardiac:  irreg-irreg; no significant murmurs, no rubs, or gallops Lungs:  CTA b/l, no wheezing, rhonchi or rales Abd: soft, nontender MS: no deformity or atrophy Ext: no edema Skin: warm and dry, no rash Neuro:  No gross deficits appreciated Psych: euthymic mood, full affect   EKG:  Done today and reviewed by myself shows  AFib 78bpm   TTE 03/04/2018  1. The left ventricle has normal systolic function of 16-10%. The cavity  size was normal. There is no increased left ventricular wall thickness.  Left ventricular diastology could not be evaluated secondary to atrial  fibrillation.   2. The right ventricle has normal systolic function. The cavity was  normal. There there are no ECGs that show atrial fibrillation no increase in right ventricular wall thickness.   3. Left atrial size was mildly dilated.   4. Normal RVSP   5. The inferior vena cava was dilated in size with <50% respiratory  variability.   Recent Labs: 06/20/2021: ALT 105; Magnesium 2.3 06/25/2021: BUN 13; Creatinine, Ser 0.88; Hemoglobin 13.3; Platelets 318; Potassium 4.3; Sodium 140  No results found for requested labs within last 365 days.   Estimated Creatinine Clearance: 85 mL/min (by C-G formula based on SCr of 0.88 mg/dL).   Wt Readings from Last 3 Encounters:  07/11/21 238 lb (108 kg)  06/26/21 235 lb (106.6 kg)  06/19/21 232 lb (105.2 kg)     Other studies reviewed: Additional studies/records reviewed today include: summarized above  ASSESSMENT AND PLAN:  Persistent afib CHA2DS2Vasc is one, on  Eliquis, appropriately dosed Will resume her flecainide '100mg'$  BID starting  tomorrow EKG next week DCCV with Dr. Curt Bears 08/01/21  Pending sleep study, she is waiting for the PIN # to get started, will reach out to sleep team to help her with that   Disposition: F/u with [place] pending EKG and DCCV.  Current medicines are reviewed at length with the patient today.  The patient did not have any concerns regarding medicines.  Venetia Night, PA-C 07/11/2021 4:15 PM     [address] [place] [place] [address] [phone] (office)  [phone] (fax)

## 2021-07-11 NOTE — Progress Notes (Addendum)
Cardiology Office Note Date:  07/11/2021  Patient ID:  Tonya Nicholson, DOB 10-09-1960, MRN [id number] PCP:  Linda Hedges, DO  Electrophysiologist: Dr. Curt Bears   Chief Complaint:  post hospital  History of Present Illness: Tonya Nicholson is a 61 y.o. female with history of HTN, Obesity, Afib, GERD.  She was planned to have DCCV recently with Dr. Curt Bears though developed acute cholecystitis and cancelled. She was admitted with fairly rapid onset belly symptoms > pain found with acute cholecystitis, started on abx, her Eliquis held for surgery. We saw her, held her flecainide will off a/c given she was still in AF. Planned to see her 3 weeks back on a/c to resume flecainide and plan DCCV Discharged 06/20/21  TODAY She is doing well. Largely unaware of her AFib No CP, palpitations She feels like she is doing very well post-op, healed up and feeling well Back on her Eliquis,no missed doses, this evening will be 3 weeks of uninterrupted [place]. No bleeding or signs of bleeding  AFib/AAD hx Diagnosed 2020 Flecainide started looks like Sep 2021  Past Medical History:  Diagnosis Date   Dyspnea    GERD (gastroesophageal reflux disease)    Hypertension    Joint pain    Knee pain    Lower extremity edema     Past Surgical History:  Procedure Laterality Date   CARDIOVERSION N/A 03/21/2018   Procedure: CARDIOVERSION;  Surgeon: Sueanne Margarita, MD;  Location: [place];  Service: Cardiovascular;  Laterality: N/A;   CARDIOVERSION N/A 03/31/2018   Procedure: CARDIOVERSION;  Surgeon: Constance Haw, MD;  Location: [place];  Service: Cardiovascular;  Laterality: N/A;   CARDIOVERSION N/A 10/06/2019   Procedure: CARDIOVERSION;  Surgeon: Constance Haw, MD;  Location: [place];  Service: Cardiovascular;  Laterality: N/A;   CHOLECYSTECTOMY N/A 06/19/2021   Procedure: LAPAROSCOPIC CHOLECYSTECTOMY;  Surgeon: Clovis Riley, MD;  Location: [place];  Service: General;   Laterality: N/A;    Current Outpatient Medications  Medication Sig Dispense Refill   flecainide (TAMBOCOR) 100 MG tablet Take 1 tablet (100 mg total) by mouth 2 (two) times daily. 180 tablet 3   amLODipine-benazepril (LOTREL) 5-40 MG capsule Take 1 capsule by mouth daily. 90 capsule 0   apixaban (ELIQUIS) 5 MG TABS tablet Take 1 tablet (5 mg total) by mouth 2 (two) times daily. 180 tablet 1   docusate sodium (COLACE) 100 MG capsule Take 1 capsule (100 mg total) by mouth 2 (two) times daily. Okay to decrease to once daily or stop taking if having loose bowel movements 30 capsule 0   metoprolol tartrate (LOPRESSOR) 25 MG tablet TAKE 1 TABLET (25 MG TOTAL) BY MOUTH 2 (TWO) TIMES DAILY. 180 tablet 1   Semaglutide-Weight Management 1 MG/0.5ML SOAJ Inject 1 mg into the skin once a week. (Patient taking differently: Inject 1 mg into the skin once a week. Thursday) 9 mL 0   traMADol (ULTRAM) 50 MG tablet Take 1 tablet (50 mg total) by mouth every 8 (eight) hours as needed for moderate pain. 10 tablet 0   Vitamin D, Ergocalciferol, (DRISDOL) 1.25 MG (50000 UNIT) CAPS capsule Take 1 capsule (50,000 Units total) by mouth every 7 (seven) days. 4 capsule 0   No current facility-administered medications for this visit.    Allergies:   Patient has no known allergies.   Social History:  The patient  reports that she has never smoked. She has never used smokeless tobacco. She reports that she does not  currently use alcohol after a past usage of about 9.0 standard drinks of alcohol per week. She reports that she does not use drugs.   Family History:  The patient's family history includes Atrial fibrillation in her mother; Bladder Cancer in her father; Diabetes in her brother, father, and mother; Healthy in her sister; Heart attack in her brother; Heart disease in her father; Hypertension in her father and mother.  ROS:  Please see the history of present illness.    All other systems are reviewed and otherwise  negative.   PHYSICAL EXAM:  VS:  BP 106/66   Pulse 78   Ht '5\' 7"'$  (1.702 m)   Wt 238 lb (108 kg)   LMP 04/09/2016 (Approximate)   SpO2 97%   BMI 37.28 kg/m  BMI: Body mass index is 37.28 kg/m. Well nourished, well developed, in no acute distress HEENT: normocephalic, atraumatic Neck: no JVD, carotid bruits or masses Cardiac:  irreg-irreg; no significant murmurs, no rubs, or gallops Lungs:  CTA b/l, no wheezing, rhonchi or rales Abd: soft, nontender MS: no deformity or atrophy Ext: no edema Skin: warm and dry, no rash Neuro:  No gross deficits appreciated Psych: euthymic mood, full affect   EKG:  Done today and reviewed by myself shows  AFib 78bpm   TTE 03/04/2018  1. The left ventricle has normal systolic function of 70-01%. The cavity  size was normal. There is no increased left ventricular wall thickness.  Left ventricular diastology could not be evaluated secondary to atrial  fibrillation.   2. The right ventricle has normal systolic function. The cavity was  normal. There there are no ECGs that show atrial fibrillation no increase in right ventricular wall thickness.   3. Left atrial size was mildly dilated.   4. Normal RVSP   5. The inferior vena cava was dilated in size with <50% respiratory  variability.   Recent Labs: 06/20/2021: ALT 105; Magnesium 2.3 06/25/2021: BUN 13; Creatinine, Ser 0.88; Hemoglobin 13.3; Platelets 318; Potassium 4.3; Sodium 140  No results found for requested labs within last 365 days.   Estimated Creatinine Clearance: 85 mL/min (by C-G formula based on SCr of 0.88 mg/dL).   Wt Readings from Last 3 Encounters:  07/11/21 238 lb (108 kg)  06/26/21 235 lb (106.6 kg)  06/19/21 232 lb (105.2 kg)     Other studies reviewed: Additional studies/records reviewed today include: summarized above  ASSESSMENT AND PLAN:  Persistent afib CHA2DS2Vasc is one, on  Eliquis, appropriately dosed Will resume her flecainide '100mg'$  BID starting  tomorrow EKG next week DCCV with Dr. Curt Bears 08/01/21  Pending sleep study, she is waiting for the PIN # to get started, will reach out to sleep team to help her with that   Disposition: F/u with [place] pending EKG and DCCV.  Current medicines are reviewed at length with the patient today.  The patient did not have any concerns regarding medicines.  Venetia Night, PA-C 07/11/2021 4:15 PM     [place] [address] [phone] (office)  [phone] (fax)

## 2021-07-12 LAB — BASIC METABOLIC PANEL
BUN/Creatinine Ratio: 20 (ref 12–28)
BUN: 17 mg/dL (ref 8–27)
CO2: 25 mmol/L (ref 20–29)
Calcium: 9.8 mg/dL (ref 8.7–10.3)
Chloride: 104 mmol/L (ref 96–106)
Creatinine, Ser: 0.83 mg/dL (ref 0.57–1.00)
Glucose: 97 mg/dL (ref 70–99)
Potassium: 4.3 mmol/L (ref 3.5–5.2)
Sodium: 141 mmol/L (ref 134–144)
eGFR: 80 mL/min/{1.73_m2} (ref >59)

## 2021-07-12 LAB — CBC
Hematocrit: 41 % (ref 34.0–46.6)
Hemoglobin: 13.8 g/dL (ref 11.1–15.9)
MCH: 30.1 pg (ref 26.6–33.0)
MCHC: 33.7 g/dL (ref 31.5–35.7)
MCV: 90 fL (ref 79–97)
Platelets: 284 10*3/uL (ref 150–450)
RBC: 4.58 x10E6/uL (ref 3.77–5.28)
RDW: 12.5 % (ref 11.7–15.4)
WBC: 5.6 10*3/uL (ref 3.4–10.8)

## 2021-07-15 ENCOUNTER — Other Ambulatory Visit (HOSPITAL_COMMUNITY): Payer: Self-pay

## 2021-07-15 ENCOUNTER — Encounter (INDEPENDENT_AMBULATORY_CARE_PROVIDER_SITE_OTHER): Payer: Self-pay | Admitting: Adult Health

## 2021-07-15 ENCOUNTER — Ambulatory Visit (INDEPENDENT_AMBULATORY_CARE_PROVIDER_SITE_OTHER): Payer: 59 | Admitting: Adult Health

## 2021-07-15 ENCOUNTER — Telehealth: Payer: Self-pay | Admitting: *Deleted

## 2021-07-15 ENCOUNTER — Ambulatory Visit (INDEPENDENT_AMBULATORY_CARE_PROVIDER_SITE_OTHER): Payer: 59 | Admitting: Cardiology

## 2021-07-15 VITALS — BP 110/75 | HR 80 | Ht 67.0 in | Wt 237.8 lb

## 2021-07-15 VITALS — BP 107/73 | HR 66 | Temp 97.7°F | Ht 67.0 in | Wt 233.0 lb

## 2021-07-15 DIAGNOSIS — Z6836 Body mass index (BMI) 36.0-36.9, adult: Secondary | ICD-10-CM | POA: Diagnosis not present

## 2021-07-15 DIAGNOSIS — Z760 Encounter for issue of repeat prescription: Secondary | ICD-10-CM | POA: Diagnosis not present

## 2021-07-15 DIAGNOSIS — E559 Vitamin D deficiency, unspecified: Secondary | ICD-10-CM

## 2021-07-15 DIAGNOSIS — R7303 Prediabetes: Secondary | ICD-10-CM | POA: Diagnosis not present

## 2021-07-15 DIAGNOSIS — E669 Obesity, unspecified: Secondary | ICD-10-CM

## 2021-07-15 DIAGNOSIS — Z9189 Other specified personal risk factors, not elsewhere classified: Secondary | ICD-10-CM

## 2021-07-15 DIAGNOSIS — I4819 Other persistent atrial fibrillation: Secondary | ICD-10-CM

## 2021-07-15 MED ORDER — WEGOVY 1 MG/0.5ML ~~LOC~~ SOAJ
1.0000 mg | SUBCUTANEOUS | 0 refills | Status: DC
  Filled 2021-07-15: qty 9, 126d supply, fill #0
  Filled 2021-08-07: qty 6, 84d supply, fill #0
  Filled 2021-08-08: qty 3, 28d supply, fill #0

## 2021-07-15 NOTE — Progress Notes (Unsigned)
   Nurse Visit   Date of Encounter: 07/15/2021 ID: Tonya Nicholson, DOB May 11, 1960, MRN [id number]  PCP:  Linda Hedges, DO   [facility] Providers Cardiologist:  Constance Haw, MD {  Visit Details   VS:  BP 110/75 (BP Location: Right Arm, Patient Position: Sitting)   Pulse 80   Ht '5\' 7"'$  (1.702 m)   Wt 237 lb 12.8 oz (107.9 kg)   LMP 04/09/2016 (Approximate)   SpO2 97%   BMI 37.24 kg/m  , BMI Body mass index is 37.24 kg/m.  Wt Readings from Last 3 Encounters:  07/15/21 237 lb 12.8 oz (107.9 kg)  07/11/21 238 lb (108 kg)  06/26/21 235 lb (106.6 kg)     Reason for visit: EKG post Flecainide restart. Performed today: Vitals, EKG, Provider consulted:Cameron Quentin Ore, and Education Changes (medications, testing, etc.) : None Length of Visit: 30 minutes  Medications Adjustments/Labs and Tests Ordered: Orders Placed This Encounter  Procedures   EKG 12-Lead   No orders of the defined types were placed in this encounter.  Patient states she feels fine. Patient denies any SOB, dizziness, or fatigue. EKG performed and reviewed by Dr. Quentin Ore. No changes today. Patient scheduled for cardioversion on 08/01/2021.  Signed, Molli Barrows, RN  07/15/2021 3:14 PM

## 2021-07-15 NOTE — Patient Instructions (Signed)
Medication Instructions:  Your physician recommends that you continue on your current medications as directed. Please refer to the Current Medication list given to you today.  *If you need a refill on your cardiac medications before your next appointment, please call your pharmacy*  Lab Work: NONE  Testing/Procedures: NONE  Follow-Up: At [facility], you and your health needs are our priority.  As part of our continuing mission to provide you with exceptional heart care, we have created designated Provider Care Teams.  These Care Teams include your primary Cardiologist (physician) and Advanced Practice Providers (APPs -  Physician Assistants and Nurse Practitioners) who all work together to provide you with the care you need, when you need it.  Your next appointment:   Call for post-cardioversion appointment with Dr. Curt Bears  The format for your next appointment:   In Person  Provider:   Will Meredith Leeds, MD {   Important Information About Sugar

## 2021-07-15 NOTE — Telephone Encounter (Signed)
Prior Authorization for [employer] sent to [facility] via web portal. Tracking Number.   5/30  No Auth Required  5/10  Case ID# [id number] was submitted on 06-04-2021

## 2021-07-15 NOTE — Telephone Encounter (Signed)
Dr. Curt Bears nurse Venida Jarvis came to me  that pt has stated to her that she has not yet received the PIN # for the [place] Study which was set up 05/29/21. I was out of the office on this date and was set up by [employer] who had assured the pt that our office would call her with a PIN #. I unfortunately had not been notified of any PIN for the pt. While RN was here I sent a secure chat to Gershon Cull, CMA:   hey lady, so Tonya Nicholson has here next to me. This pt was set up with Itamar 54/23, kelly set her up, but the pt has not been given a PIN #  yet. I have not seen anything yet come over for a approval. Pt is here in the office now for an EKG. Can you please see if she has been approved. I was out of the  office the day she was set up. Order was placed 5/4 when she saw [place]   Reply from [place]: 5/30  No Auth Required  5/10  Case ID# [id number] was submitted on 06-04-2021   I then informed RN and she asked me if I would please come talk to the pt. I said be happy to. I s/w the pt who was here for a nurse visit. I apologized to the pt for the delay in our practice getting a pin # to her. Pt did also have a few questions about the sleep study that I was able to answer for the pt. Pt thanked me for the help today. Pt states she will do the sleep study sometime between tonight and the weekend.

## 2021-07-16 ENCOUNTER — Other Ambulatory Visit: Payer: Self-pay | Admitting: *Deleted

## 2021-07-16 ENCOUNTER — Other Ambulatory Visit (HOSPITAL_COMMUNITY): Payer: Self-pay

## 2021-07-16 ENCOUNTER — Other Ambulatory Visit (INDEPENDENT_AMBULATORY_CARE_PROVIDER_SITE_OTHER): Payer: Self-pay | Admitting: Adult Health

## 2021-07-16 ENCOUNTER — Encounter (INDEPENDENT_AMBULATORY_CARE_PROVIDER_SITE_OTHER): Payer: Self-pay | Admitting: Adult Health

## 2021-07-16 LAB — HEMOGLOBIN A1C
Est. average glucose Bld gHb Est-mCnc: 111 mg/dL
Hgb A1c MFr Bld: 5.5 % (ref 4.8–5.6)

## 2021-07-16 LAB — VITAMIN D 25 HYDROXY (VIT D DEFICIENCY, FRACTURES): Vit D, 25-Hydroxy: 77.7 ng/mL (ref 30.0–100.0)

## 2021-07-16 NOTE — Progress Notes (Signed)
Chief Complaint:   OBESITY Tonya Nicholson is here to discuss her progress with her obesity treatment plan along with follow-up of her obesity related diagnoses. Tonya Nicholson is on the Category 2 Plan and states she is following her eating plan approximately 75% of the time. Tonya Nicholson states she is doing gym exercises, treadmill for 30 minutes 4 times per week.  Today's visit was #: 75 Starting weight: 263 lbs Starting date: 02/24/2018 Today's weight: 233 lbs Today's date: 07/15/21 Total lbs lost to date: 30 lbs Total lbs lost since last in-office visit: -2  Interim History:  When eating "off plan"-25% of the time she will not consume all prescribed food. Ultimate goal to weigh 195 pounds, current 233 pounds.  Subjective:   1. Vitamin D deficiency She is on ergocalciferol 50,000 IU once weekly-tolerating well.  2. Prediabetes She is on Wegovy 1 mg once weekly. She denies mass in neck, dysphagia, dyspepsia, persistent hoarseness, abd pain, or N/V/Constipation. 06/25/21 CMP: GFR 75, BG 106  3. At risk for constipation Related to taking Wegovy 1 mg.  Assessment/Plan:   1. Vitamin D deficiency Check vitamin D level, then MyChart patient with level and refill Ergocalciferol as appropriate. - VITAMIN D 25 Hydroxy (Vit-D Deficiency, Fractures)  2. Prediabetes Check A1c. - Hemoglobin A1c Continue GLP-1 therapy.  3. At risk for constipation Tonya Nicholson was given approximately 15 minutes of counseling today regarding prevention of constipation. She was encouraged to increase water and fiber intake.    4. Obesity with current BMI 36.6  Refill Wegovy 1 mg once weekly, dispense 9 mL, no refills.  Tonya Nicholson is currently in the action stage of change. As such, her goal is to continue with weight loss efforts. She has agreed to the Category 2 Plan.   Exercise goals: as is.  Behavioral modification strategies: increasing lean protein intake, decreasing simple carbohydrates, meal planning and cooking  strategies, keeping healthy foods in the home, and planning for success.  Tonya Nicholson has agreed to follow-up with our clinic in 4 weeks. She was informed of the importance of frequent follow-up visits to maximize her success with intensive lifestyle modifications for her multiple health conditions.   Tonya Nicholson was informed we would discuss her lab results at her next visit unless there is a critical issue that needs to be addressed sooner. Tonya Nicholson agreed to keep her next visit at the agreed upon time to discuss these results.  Objective:   Blood pressure 107/73, pulse 66, temperature 97.7 F (36.5 C), height '5\' 7"'$  (1.702 m), weight 233 lb (105.7 kg), last menstrual period 04/09/2016, SpO2 97 %. Body mass index is 36.49 kg/m.  General: Cooperative, alert, well developed, in no acute distress. HEENT: Conjunctivae and lids unremarkable. Cardiovascular: Regular rhythm.  Lungs: Normal work of breathing. Neurologic: No focal deficits.   Lab Results  Component Value Date   CREATININE 0.83 07/11/2021   BUN 17 07/11/2021   NA 141 07/11/2021   K 4.3 07/11/2021   CL 104 07/11/2021   CO2 25 07/11/2021   Lab Results  Component Value Date   ALT 105 (H) 06/20/2021   AST 42 (H) 06/20/2021   ALKPHOS 65 06/20/2021   BILITOT 0.2 (L) 06/20/2021   Lab Results  Component Value Date   HGBA1C 5.5 07/15/2021   HGBA1C 5.2 06/18/2021   HGBA1C 5.3 04/10/2021   HGBA1C 6.0 (H) 09/26/2020   HGBA1C 6.0 (H) 04/25/2020   Lab Results  Component Value Date   INSULIN 12.8 04/10/2021   INSULIN 11.0  09/26/2020   INSULIN 9.4 04/25/2020   INSULIN 14.5 06/29/2019   INSULIN 8.2 01/31/2019   Lab Results  Component Value Date   TSH 1.410 02/24/2018   Lab Results  Component Value Date   CHOL 166 04/25/2020   HDL 54 04/25/2020   LDLCALC 98 04/25/2020   TRIG 71 04/25/2020   CHOLHDL 3.1 04/25/2020   Lab Results  Component Value Date   VD25OH 77.7 07/15/2021   VD25OH 58.6 04/10/2021   VD25OH 37.6 09/26/2020    Lab Results  Component Value Date   WBC 5.6 07/11/2021   HGB 13.8 07/11/2021   HCT 41.0 07/11/2021   MCV 90 07/11/2021   PLT 284 07/11/2021   No results found for: "IRON", "TIBC", "FERRITIN"  Attestation Statements:   Reviewed by clinician on day of visit: allergies, medications, problem list, medical history, surgical history, family history, social history, and previous encounter notes.  I, Georgianne Fick, FNP, am acting as Location manager for Mina Marble, NP.  I have reviewed the above documentation for accuracy and completeness, and I agree with the above. - Braden Deloach d. Yoshie Kosel, NP-C

## 2021-07-16 NOTE — Patient Outreach (Signed)
[place] [facility]) Care Management  07/16/2021  Tonya Nicholson 13-Sep-1960 [id number]  Third telephone outreach to continue [place] Management services, no answer, left message and requested a return call. Advise that if NP did not get a response, will close case. Advised she may at any time in the future reach out for assistance.  [name] called NP back right away. She says she is doing well. Has gone back to work and is working hard. She does not feel she has any further care management need at this time.  [name]. Myrtie Neither, MSN, [facility] Gerontological Nurse Practitioner [facility] Care Management [phone]

## 2021-07-23 ENCOUNTER — Encounter (HOSPITAL_COMMUNITY): Payer: Self-pay | Admitting: Cardiology

## 2021-07-23 NOTE — Progress Notes (Signed)
Attempted to obtain medical history via telephone, unable to reach at this time. HIPAA compliant voicemail message left requesting return call to pre surgical testing department. 

## 2021-07-25 ENCOUNTER — Encounter (INDEPENDENT_AMBULATORY_CARE_PROVIDER_SITE_OTHER): Payer: 59 | Admitting: Cardiology

## 2021-07-25 DIAGNOSIS — G4734 Idiopathic sleep related nonobstructive alveolar hypoventilation: Secondary | ICD-10-CM | POA: Diagnosis not present

## 2021-07-25 DIAGNOSIS — G4733 Obstructive sleep apnea (adult) (pediatric): Secondary | ICD-10-CM | POA: Diagnosis not present

## 2021-07-29 NOTE — Procedures (Signed)
   SLEEP STUDY REPORT Patient Information Study Date: 07/25/21 Patient Name: Tonya Nicholson Patient ID: [id number] Birth Date: 05/15/2060 Age: 60 Gender: Female BMI: 38.4 (W=244 lb, H=5' 7'') Referring Physician: Allegra Lai, MD  TEST DESCRIPTION: Home sleep apnea testing was completed using the WatchPat, a Type 1 device, utilizing peripheral arterial tonometry (PAT), chest movement, actigraphy, pulse oximetry, pulse rate, body position and snore. AHI was calculated with apnea and hypopnea using valid sleep time as the denominator. RDI includes apneas, hypopneas, and RERAs. The data acquired and the scoring of sleep and all associated events were performed in accordance with the recommended standards and specifications as outlined in the AASM Manual for the Scoring of Sleep and Associated Events 2.2.0 (2015).  FINDINGS: 1. Mild Obstructive Sleep Apnea with AHI 9.1/hr. 2. No Central Sleep Apnea with pAHIc 0/hr. 3. Oxygen desaturations as low as 73%. 4. Severe snoring was present. O2 sats were < 88% for 14.5 min. 5. Total sleep time was 8 hrs and 3 min. 6. 18.5% of total sleep time was spent in REM sleep. 7. Normal sleep onset latency at 16 min. 8. Prolonged REM sleep onset latency at 126 min. 9.Total awakenings were 4.  DIAGNOSIS: Mild Obstructive Sleep Apnea (G47.33) Nocturnal Hypoxemia  RECOMMENDATIONS: 1. Clinical correlation of these findings is necessary. The decision to treat obstructive sleep apnea (OSA) is usually based on the presence of apnea symptoms or the presence of associated medical conditions such as Hypertension, Congestive Heart Failure, Atrial Fibrillation or Obesity. The most common symptoms of OSA are snoring, gasping for breath while sleeping, daytime sleepiness and fatigue.  2. Initiating apnea therapy is recommended given the presence of symptoms and/or associated conditions. Recommend proceeding with one of the following:   a. Auto-CPAP therapy with a  pressure range of 5-20cm H2O.   b. An oral appliance (OA) that can be obtained from certain dentists with expertise in sleep medicine. These are primarily of use in non-obese patients with mild and moderate disease.   c. An ENT consultation which may be useful to look for specific causes of obstruction and possible treatment options.   d. If patient is intolerant to PAP therapy, consider referral to ENT for evaluation for hypoglossal nerve stimulator.  3. Close follow-up is necessary to ensure success with CPAP or oral appliance therapy for maximum benefit .  4. A follow-up oximetry study on CPAP is recommended to assess the adequacy of therapy and determine the need for supplemental oxygen or the potential need for Bi-level therapy. An arterial blood gas to determine the adequacy of baseline ventilation and oxygenation should also be considered.  5. Healthy sleep recommendations include: adequate nightly sleep (normal 7-9 hrs/night), avoidance of caffeine after noon and alcohol near bedtime, and maintaining a sleep environment that is cool, dark and quiet.  6. Weight loss for overweight patients is recommended. Even modest amounts of weight loss can significantly improve the severity of sleep apnea.  7. Snoring recommendations include: weight loss where appropriate, side sleeping, and avoidance of alcohol before bed.  8. Operation of motor vehicle should be avoided when sleepy.  Signature: Electronically Signed: 07/29/21 Fransico Him, MD; [facility]; [place], American Board of Sleep Medicine Report prepared by: Fransico Him

## 2021-08-01 ENCOUNTER — Ambulatory Visit (HOSPITAL_COMMUNITY): Payer: 59 | Admitting: General Practice

## 2021-08-01 ENCOUNTER — Other Ambulatory Visit: Payer: Self-pay

## 2021-08-01 ENCOUNTER — Ambulatory Visit (HOSPITAL_BASED_OUTPATIENT_CLINIC_OR_DEPARTMENT_OTHER): Payer: 59 | Admitting: General Practice

## 2021-08-01 ENCOUNTER — Ambulatory Visit (HOSPITAL_COMMUNITY)
Admission: RE | Admit: 2021-08-01 | Discharge: 2021-08-01 | Disposition: A | Discharge status: Home / Self Care | Payer: 59 | Attending: Cardiology | Admitting: Cardiology

## 2021-08-01 ENCOUNTER — Encounter (HOSPITAL_COMMUNITY)
Admission: RE | Disposition: A | Discharge status: Home / Self Care | Payer: 59 | Source: Home / Self Care | Attending: Cardiology

## 2021-08-01 ENCOUNTER — Encounter (HOSPITAL_COMMUNITY): Payer: Self-pay | Admitting: Cardiology

## 2021-08-01 ENCOUNTER — Ambulatory Visit: Payer: 59

## 2021-08-01 DIAGNOSIS — Z79899 Other long term (current) drug therapy: Secondary | ICD-10-CM | POA: Insufficient documentation

## 2021-08-01 DIAGNOSIS — Z6837 Body mass index (BMI) 37.0-37.9, adult: Secondary | ICD-10-CM | POA: Diagnosis not present

## 2021-08-01 DIAGNOSIS — I4819 Other persistent atrial fibrillation: Secondary | ICD-10-CM | POA: Insufficient documentation

## 2021-08-01 DIAGNOSIS — Z7901 Long term (current) use of anticoagulants: Secondary | ICD-10-CM | POA: Insufficient documentation

## 2021-08-01 DIAGNOSIS — E669 Obesity, unspecified: Secondary | ICD-10-CM | POA: Diagnosis not present

## 2021-08-01 DIAGNOSIS — I10 Essential (primary) hypertension: Secondary | ICD-10-CM | POA: Diagnosis not present

## 2021-08-01 DIAGNOSIS — K219 Gastro-esophageal reflux disease without esophagitis: Secondary | ICD-10-CM | POA: Diagnosis not present

## 2021-08-01 DIAGNOSIS — I482 Chronic atrial fibrillation, unspecified: Secondary | ICD-10-CM | POA: Diagnosis not present

## 2021-08-01 DIAGNOSIS — I4891 Unspecified atrial fibrillation: Secondary | ICD-10-CM | POA: Diagnosis not present

## 2021-08-01 DIAGNOSIS — R0683 Snoring: Secondary | ICD-10-CM

## 2021-08-01 DIAGNOSIS — R400 Somnolence: Secondary | ICD-10-CM

## 2021-08-01 HISTORY — PX: CARDIOVERSION: SHX1299

## 2021-08-01 SURGERY — CARDIOVERSION
Anesthesia: General

## 2021-08-01 MED ORDER — SODIUM CHLORIDE 0.9 % IV SOLN: INTRAVENOUS | Status: DC | PRN

## 2021-08-01 MED ORDER — PROPOFOL 10 MG/ML IV BOLUS
INTRAVENOUS | Status: DC | PRN
  Administered 2021-08-01: 10 mg via INTRAVENOUS
  Administered 2021-08-01: 50 mg via INTRAVENOUS

## 2021-08-01 NOTE — Interval H&P Note (Signed)
History and Physical Interval Note:  08/01/2021 1:32 PM  Tonya Nicholson  has presented today for surgery, with the diagnosis of Afib.  The various methods of treatment have been discussed with the patient and family. After consideration of risks, benefits and other options for treatment, the patient has consented to  Procedure(s): CARDIOVERSION (N/A) as a surgical intervention.  The patient's history has been reviewed, patient examined, no change in status, stable for surgery.  I have reviewed the patient's chart and labs.  Questions were answered to the patient's satisfaction.     [name] [employer]

## 2021-08-01 NOTE — Anesthesia Procedure Notes (Signed)
Procedure Name: General with mask airway Date/Time: 08/01/2021 1:46 PM  Performed by: Dorann Lodge, CRNAPre-anesthesia Checklist: Patient identified, Emergency Drugs available, Suction available and Patient being monitored Patient Re-evaluated:Patient Re-evaluated prior to induction Oxygen Delivery Method: Ambu bag Preoxygenation: Pre-oxygenation with 100% oxygen Induction Type: IV induction Dental Injury: Teeth and Oropharynx as per pre-operative assessment

## 2021-08-01 NOTE — Progress Notes (Signed)
Pt aaox4, dressed and eating a snack, awaiting ride from daughter

## 2021-08-01 NOTE — Discharge Instructions (Signed)

## 2021-08-01 NOTE — Anesthesia Postprocedure Evaluation (Signed)
Anesthesia Post Note  Patient: Tonya Nicholson  Procedure(s) Performed: CARDIOVERSION     Patient location during evaluation: Endoscopy Anesthesia Type: General Level of consciousness: awake and alert Pain management: pain level controlled Vital Signs Assessment: post-procedure vital signs reviewed and stable Respiratory status: spontaneous breathing, nonlabored ventilation and respiratory function stable Cardiovascular status: blood pressure returned to baseline and stable Postop Assessment: no apparent nausea or vomiting Anesthetic complications: no   No notable events documented.  Last Vitals:  Vitals:   08/01/21 1400 08/01/21 1410  BP: 105/67 105/68  Pulse: 68 66  Resp: 16 12  Temp:    SpO2: 99% 97%    Last Pain:  Vitals:   08/01/21 1410  TempSrc:   PainSc: 0-No pain                 Darnesha Diloreto

## 2021-08-01 NOTE — Procedures (Signed)
Electrical Cardioversion Procedure Note [name] [id number] 26-Jun-1960  Procedure: Electrical Cardioversion Indications:  Atrial Fibrillation  Procedure Details Consent: Risks of procedure as well as the alternatives and risks of each were explained to the (patient/caregiver).  Consent for procedure obtained. Time Out: Verified patient identification, verified procedure, site/side was marked, verified correct patient position, special equipment/implants available, medications/allergies/relevent history reviewed, required imaging and test results available.  Performed  Patient placed on cardiac monitor, pulse oximetry, supplemental oxygen as necessary.  Sedation given:  propofol Pacer pads placed anterior and posterior chest.  Cardioverted 1 time(s).  Cardioverted at [place].  Evaluation Findings: Post procedure EKG shows: NSR Complications: None Patient did tolerate procedure well.   Tonya Nicholson 08/01/2021, 1:54 PM

## 2021-08-01 NOTE — Anesthesia Preprocedure Evaluation (Signed)
Anesthesia Evaluation  Patient identified by MRN, date of birth, ID band Patient awake    Reviewed: Allergy & Precautions, NPO status , Patient's Chart, lab work & pertinent test results  History of Anesthesia Complications Negative for: history of anesthetic complications  Airway Mallampati: III  TM Distance: >3 FB Neck ROM: Full    Dental  (+) Teeth Intact, Dental Advisory Given   Pulmonary neg pulmonary ROS,  Covid-19 Nucleic Acid Test Results Lab Results      Component                Value               Date                      SARSCOV2NAA              NEGATIVE            10/05/2019              breath sounds clear to auscultation       Cardiovascular hypertension, Pt. on medications and Pt. on home beta blockers + dysrhythmias Atrial Fibrillation  Rhythm:Irregular  1. The left ventricle has normal systolic function of 56-31%. The cavity  size was normal. There is no increased left ventricular wall thickness.  Left ventricular diastology could not be evaluated secondary to atrial  fibrillation.  2. The right ventricle has normal systolic function. The cavity was  normal. There is no increase in right ventricular wall thickness.  3. Left atrial size was mildly dilated.  4. Normal RVSP  5. The inferior vena cava was dilated in size with <50% respiratory  variability.    Neuro/Psych PSYCHIATRIC DISORDERS Depression negative neurological ROS     GI/Hepatic Neg liver ROS, GERD  ,  Endo/Other  negative endocrine ROS  Renal/GU negative Renal ROSLab Results      Component                Value               Date                      CREATININE               0.80                10/03/2019           Lab Results      Component                Value               Date                      K                        4.5                 10/03/2019                Musculoskeletal negative musculoskeletal ROS (+)    Abdominal   Peds  Hematology negative hematology ROS (+) eliquis for afib    Anesthesia Other Findings   Reproductive/Obstetrics  Anesthesia Physical Anesthesia Plan  ASA: 3  Anesthesia Plan: General   Post-op Pain Management:    Induction: Intravenous  PONV Risk Score and Plan: 3 and Treatment may vary due to age or medical condition  Airway Management Planned: Mask  Additional Equipment: None  Intra-op Plan:   Post-operative Plan:   Informed Consent: I have reviewed the patients History and Physical, chart, labs and discussed the procedure including the risks, benefits and alternatives for the proposed anesthesia with the patient or authorized representative who has indicated his/her understanding and acceptance.     Dental advisory given  Plan Discussed with: CRNA  Anesthesia Plan Comments:         Anesthesia Quick Evaluation

## 2021-08-01 NOTE — Transfer of Care (Signed)
Immediate Anesthesia Transfer of Care Note  Patient: Tonya Nicholson  Procedure(s) Performed: CARDIOVERSION  Patient Location: Endoscopy Unit  Anesthesia Type:General  Level of Consciousness: awake and drowsy  Airway & Oxygen Therapy: Patient Spontanous Breathing  Post-op Assessment: Report given to RN and Post -op Vital signs reviewed and stable  Post vital signs: Reviewed and stable  Last Vitals:  Vitals Value Taken Time  BP 116/72 (86)   Temp    Pulse 69   Resp 14   SpO2 100     Last Pain:  Vitals:   08/01/21 1320  TempSrc: Temporal  PainSc: 0-No pain         Complications: No notable events documented.

## 2021-08-03 ENCOUNTER — Encounter (HOSPITAL_COMMUNITY): Payer: Self-pay | Admitting: Cardiology

## 2021-08-05 ENCOUNTER — Telehealth: Payer: Self-pay | Admitting: *Deleted

## 2021-08-05 NOTE — Telephone Encounter (Signed)
The patient has been notified of the result and verbalized understanding.  All questions (if any) were answered. Marolyn Hammock, [place] 08/05/2021 4:30 PM    Patient wants an appointment to discuss her sleep study results and other alternatives.

## 2021-08-05 NOTE — Telephone Encounter (Signed)
-----   Message from Lauralee Evener, [place] sent at 07/30/2021  8:37 AM EDT -----  ----- Message ----- From: Sueanne Margarita, MD Sent: 07/29/2021  11:25 AM EDT To: Cv Div Sleep Studies  Please let patient know that they have sleep apnea.  Recommend therapeutic CPAP titration for treatment of patient's sleep disordered breathing.  If unable to perform an in lab titration then initiate ResMed auto CPAP from 4 to 15cm H2O with heated humidity and mask of choice and overnight pulse ox on CPAP.

## 2021-08-06 NOTE — Telephone Encounter (Signed)
Spoke with the patient and schedule her an appointment to see Dr. Radford Pax to discuss her sleep study results.

## 2021-08-07 ENCOUNTER — Other Ambulatory Visit (HOSPITAL_COMMUNITY): Payer: Self-pay

## 2021-08-07 MED ORDER — AMLODIPINE BESY-BENAZEPRIL HCL 5-40 MG PO CAPS
1.0000 | ORAL_CAPSULE | Freq: Every day | ORAL | 0 refills | Status: DC
  Filled 2021-08-07 – 2021-09-19 (×2): qty 90, 90d supply, fill #0

## 2021-08-08 ENCOUNTER — Other Ambulatory Visit (HOSPITAL_COMMUNITY): Payer: Self-pay

## 2021-08-11 ENCOUNTER — Other Ambulatory Visit (HOSPITAL_COMMUNITY): Payer: Self-pay

## 2021-08-11 ENCOUNTER — Encounter (INDEPENDENT_AMBULATORY_CARE_PROVIDER_SITE_OTHER): Payer: Self-pay

## 2021-08-11 ENCOUNTER — Telehealth (INDEPENDENT_AMBULATORY_CARE_PROVIDER_SITE_OTHER): Payer: Self-pay | Admitting: Adult Health

## 2021-08-11 NOTE — Telephone Encounter (Signed)
Tonya Nicholson - Prior authorization approved for [name]. Effective: 08/09/2021 to 08/09/2022. Patient sent approval message via mychart.

## 2021-08-15 ENCOUNTER — Other Ambulatory Visit (HOSPITAL_COMMUNITY): Payer: Self-pay

## 2021-08-19 ENCOUNTER — Other Ambulatory Visit (HOSPITAL_COMMUNITY): Payer: Self-pay

## 2021-08-20 ENCOUNTER — Encounter (INDEPENDENT_AMBULATORY_CARE_PROVIDER_SITE_OTHER): Payer: Self-pay | Admitting: Adult Health

## 2021-08-20 ENCOUNTER — Ambulatory Visit (INDEPENDENT_AMBULATORY_CARE_PROVIDER_SITE_OTHER): Payer: 59 | Admitting: Adult Health

## 2021-08-20 ENCOUNTER — Other Ambulatory Visit (HOSPITAL_COMMUNITY): Payer: Self-pay

## 2021-08-20 VITALS — BP 109/68 | HR 65 | Temp 98.2°F | Ht 67.0 in | Wt 232.0 lb

## 2021-08-20 DIAGNOSIS — I480 Paroxysmal atrial fibrillation: Secondary | ICD-10-CM

## 2021-08-20 DIAGNOSIS — E669 Obesity, unspecified: Secondary | ICD-10-CM

## 2021-08-20 DIAGNOSIS — R7303 Prediabetes: Secondary | ICD-10-CM

## 2021-08-20 DIAGNOSIS — Z6836 Body mass index (BMI) 36.0-36.9, adult: Secondary | ICD-10-CM | POA: Diagnosis not present

## 2021-08-20 DIAGNOSIS — E559 Vitamin D deficiency, unspecified: Secondary | ICD-10-CM | POA: Diagnosis not present

## 2021-08-20 MED ORDER — WEGOVY 1.7 MG/0.75ML ~~LOC~~ SOAJ
1.7000 mg | SUBCUTANEOUS | 0 refills | Status: DC
  Filled 2021-08-20: qty 3, 28d supply, fill #0
  Filled 2021-09-08: qty 3, 28d supply, fill #1
  Filled 2021-10-14: qty 3, 28d supply, fill #2

## 2021-08-21 ENCOUNTER — Ambulatory Visit (INDEPENDENT_AMBULATORY_CARE_PROVIDER_SITE_OTHER): Payer: 59 | Admitting: Adult Health

## 2021-08-28 DIAGNOSIS — Z01419 Encounter for gynecological examination (general) (routine) without abnormal findings: Secondary | ICD-10-CM | POA: Diagnosis not present

## 2021-08-28 DIAGNOSIS — Z124 Encounter for screening for malignant neoplasm of cervix: Secondary | ICD-10-CM | POA: Diagnosis not present

## 2021-08-28 DIAGNOSIS — Z6838 Body mass index (BMI) 38.0-38.9, adult: Secondary | ICD-10-CM | POA: Diagnosis not present

## 2021-08-28 DIAGNOSIS — Z1231 Encounter for screening mammogram for malignant neoplasm of breast: Secondary | ICD-10-CM | POA: Diagnosis not present

## 2021-08-28 NOTE — Progress Notes (Signed)
Chief Complaint:   OBESITY Tonya Nicholson is here to discuss her progress with her obesity treatment plan along with follow-up of her obesity related diagnoses. Tonya Nicholson is on the Category 2 Plan and states she is following her eating plan approximately 75% of the time. Tonya Nicholson states she is going to the gym-exercise, treadmill for 30 to 45 minutes 4 times per week.  Today's visit was #: 52 Starting weight: 263 lbs Starting date: 02/24/2018 Today's weight: 68 Today's date: 08/20/21 Total lbs lost to date: 31 Total lbs lost since last in-office visit: -1  Interim History:  Prior Authorization for [facility] approved 6/23-7/24. Ozempic 1 mg/Wegovy 1 mg since December 2022.  Injects on Sundays.  08/01/2021-successful cardioversion.  Subjective:   1. Prediabetes Prior Authorization for [facility] approved 6/23-7/24. Ozempic 1 mg/Wegovy 1 mg since December 2022.  Injects on Sundays. 07/15/21 A1c 5.5 Discussed lab results with patient today.  2. PAF (paroxysmal atrial fibrillation) ([place]) 08/01/2021 cardioversion office visit-she denies acute cardiac sx's at present.  3. Vitamin D deficiency 07/15/2020 vitamin D level-77.1-at goal. She has yet to start OTC vitamin D3 2000 IU daily. Discussed lab results with patient today.  Assessment/Plan:   1. Prediabetes Continue GLP-1 therapy.  2. PAF (paroxysmal atrial fibrillation) ([place]) Follow-up with cardiology/EP as directed.  3. Vitamin D deficiency Start OTC vitamin D3 2,000 IU daily.  4. Obesity, current BMI 36.4 Refill and increase dose of Wegovy. - Semaglutide-Weight Management (WEGOVY) 1.7 MG/0.75ML SOAJ; Inject 1.7 mg into the skin once a week.  Dispense: 9 mL; Refill: 0  Tonya Nicholson is currently in the action stage of change. As such, her goal is to continue with weight loss efforts. She has agreed to the Category 2 Plan.   Exercise goals: as is  Behavioral modification strategies: increasing lean protein intake, decreasing simple carbohydrates,  meal planning and cooking strategies, keeping healthy foods in the home, and planning for success.  Tonya Nicholson has agreed to follow-up with our clinic in 4 weeks. She was informed of the importance of frequent follow-up visits to maximize her success with intensive lifestyle modifications for her multiple health conditions.   Objective:   Blood pressure 109/68, pulse 65, temperature 98.2 F (36.8 C), height '5\' 7"'$  (1.702 m), weight 232 lb (105.2 kg), last menstrual period 04/09/2016, SpO2 98 %. Body mass index is 36.34 kg/m.  General: Cooperative, alert, well developed, in no acute distress. HEENT: Conjunctivae and lids unremarkable. Cardiovascular: Regular rhythm.  Lungs: Normal work of breathing. Neurologic: No focal deficits.   Lab Results  Component Value Date   CREATININE 0.83 07/11/2021   BUN 17 07/11/2021   NA 141 07/11/2021   K 4.3 07/11/2021   CL 104 07/11/2021   CO2 25 07/11/2021   Lab Results  Component Value Date   ALT 105 (H) 06/20/2021   AST 42 (H) 06/20/2021   ALKPHOS 65 06/20/2021   BILITOT 0.2 (L) 06/20/2021   Lab Results  Component Value Date   HGBA1C 5.5 07/15/2021   HGBA1C 5.2 06/18/2021   HGBA1C 5.3 04/10/2021   HGBA1C 6.0 (H) 09/26/2020   HGBA1C 6.0 (H) 04/25/2020   Lab Results  Component Value Date   INSULIN 12.8 04/10/2021   INSULIN 11.0 09/26/2020   INSULIN 9.4 04/25/2020   INSULIN 14.5 06/29/2019   INSULIN 8.2 01/31/2019   Lab Results  Component Value Date   TSH 1.410 02/24/2018   Lab Results  Component Value Date   CHOL 166 04/25/2020   HDL 54 04/25/2020  [place] 98 04/25/2020   TRIG 71 04/25/2020   CHOLHDL 3.1 04/25/2020   Lab Results  Component Value Date   VD25OH 77.7 07/15/2021   VD25OH 58.6 04/10/2021   VD25OH 37.6 09/26/2020   Lab Results  Component Value Date   WBC 5.6 07/11/2021   HGB 13.8 07/11/2021   HCT 41.0 07/11/2021   MCV 90 07/11/2021   PLT 284 07/11/2021   No results found for: "IRON", "TIBC",  "FERRITIN"  Attestation Statements:   Reviewed by clinician on day of visit: allergies, medications, problem list, medical history, surgical history, family history, social history, and previous encounter notes.  I, Georgianne Fick, FNP, am acting as Location manager for Mina Marble, NP.  I have reviewed the above documentation for accuracy and completeness, and I agree with the above. -  Tonya Nicholson d. Tonya Chou, NP-C

## 2021-08-29 ENCOUNTER — Ambulatory Visit: Payer: Commercial Managed Care - PPO | Admitting: Cardiology

## 2021-09-02 DIAGNOSIS — I480 Paroxysmal atrial fibrillation: Secondary | ICD-10-CM | POA: Insufficient documentation

## 2021-09-03 ENCOUNTER — Encounter (INDEPENDENT_AMBULATORY_CARE_PROVIDER_SITE_OTHER): Payer: Self-pay

## 2021-09-04 ENCOUNTER — Ambulatory Visit: Payer: 59 | Admitting: Physician Assistant

## 2021-09-05 ENCOUNTER — Ambulatory Visit: Payer: 59 | Admitting: Physician Assistant

## 2021-09-08 ENCOUNTER — Other Ambulatory Visit (HOSPITAL_COMMUNITY): Payer: Self-pay

## 2021-09-11 ENCOUNTER — Other Ambulatory Visit (HOSPITAL_COMMUNITY): Payer: Self-pay

## 2021-09-17 NOTE — Progress Notes (Unsigned)
Cardiology Office Note Date:  09/17/2021  Patient ID:  Tonya Nicholson, Tonya Nicholson 21-Mar-1960, MRN [id number] PCP:  Linda Hedges, DO  Electrophysiologist: Dr. Curt Bears   Chief Complaint:  post DCCV  History of Present Illness: Tonya Nicholson is a 61 y.o. female with history of HTN, Obesity, Afib, GERD.  She was planned to have DCCV recently with Dr. Curt Bears though developed acute cholecystitis and cancelled. She was admitted with fairly rapid onset belly symptoms > pain found with acute cholecystitis, started on abx, her Eliquis held for surgery. We saw her, held her flecainide will off a/c given she was still in AF. Planned to see her 3 weeks back on a/c to resume flecainide and plan DCCV Discharged 06/20/21  I saw her 07/11/21 She is doing well. Largely unaware of her AFib No CP, palpitations She feels like she is doing very well post-op, healed up and feeling well Back on her Eliquis,no missed doses, this evening will be 3 weeks of uninterrupted [place]. No bleeding or signs of bleeding Flecainide resumed and planned for DCCV  DCCV 08/01/2021 successful  OSA eval is in progress, see Dr. Radford Pax 09/18/21  On Wegovy with weight/wellness  *** flecainide EKG, nodal blocker *** symptoms *** eliquis, bleeding   AFib/AAD hx Diagnosed 2020 Flecainide started looks like Sep 2021  Past Medical History:  Diagnosis Date   Dyspnea    GERD (gastroesophageal reflux disease)    Hypertension    Joint pain    Knee pain    Lower extremity edema     Past Surgical History:  Procedure Laterality Date   CARDIOVERSION N/A 03/21/2018   Procedure: CARDIOVERSION;  Surgeon: Sueanne Margarita, MD;  Location: [facility];  Service: Cardiovascular;  Laterality: N/A;   CARDIOVERSION N/A 03/31/2018   Procedure: CARDIOVERSION;  Surgeon: Constance Haw, MD;  Location: [place];  Service: Cardiovascular;  Laterality: N/A;   CARDIOVERSION N/A 10/06/2019   Procedure: CARDIOVERSION;  Surgeon: Constance Haw, MD;  Location: [place];  Service: Cardiovascular;  Laterality: N/A;   CARDIOVERSION N/A 08/01/2021   Procedure: CARDIOVERSION;  Surgeon: Constance Haw, MD;  Location: [facility];  Service: Cardiovascular;  Laterality: N/A;   CHOLECYSTECTOMY N/A 06/19/2021   Procedure: LAPAROSCOPIC CHOLECYSTECTOMY;  Surgeon: Clovis Riley, MD;  Location: [place];  Service: General;  Laterality: N/A;    Current Outpatient Medications  Medication Sig Dispense Refill   amLODipine-benazepril (LOTREL) 5-40 MG capsule Take 1 capsule by mouth daily. 90 capsule 0   apixaban (ELIQUIS) 5 MG TABS tablet Take 1 tablet (5 mg total) by mouth 2 (two) times daily. 180 tablet 1   flecainide (TAMBOCOR) 100 MG tablet Take 1 tablet (100 mg total) by mouth 2 (two) times daily. 180 tablet 3   metoprolol tartrate (LOPRESSOR) 25 MG tablet TAKE 1 TABLET (25 MG TOTAL) BY MOUTH 2 (TWO) TIMES DAILY. 180 tablet 1   Semaglutide-Weight Management (WEGOVY) 1.7 MG/0.75ML SOAJ Inject 1.7 mg into the skin once a week. 9 mL 0   traMADol (ULTRAM) 50 MG tablet Take 1 tablet (50 mg total) by mouth every 8 (eight) hours as needed for moderate pain. 10 tablet 0   No current facility-administered medications for this visit.    Allergies:   Patient has no known allergies.   Social History:  The patient  reports that she has never smoked. She has never used smokeless tobacco. She reports that she does not currently use alcohol after a past usage of about 9.0 standard  drinks of alcohol per week. She reports that she does not use drugs.   Family History:  The patient's family history includes Atrial fibrillation in her mother; Bladder Cancer in her father; Diabetes in her brother, father, and mother; Healthy in her sister; Heart attack in her brother; Heart disease in her father; Hypertension in her father and mother.  ROS:  Please see the history of present illness.    All other systems are reviewed and otherwise negative.    PHYSICAL EXAM:  VS:  LMP 04/09/2016 (Approximate)  BMI: There is no height or weight on file to calculate BMI. Well nourished, well developed, in no acute distress HEENT: normocephalic, atraumatic Neck: no JVD, carotid bruits or masses Cardiac:  ***; no significant murmurs, no rubs, or gallops Lungs:  *** CTA b/l, no wheezing, rhonchi or rales Abd: soft, nontender MS: no deformity or atrophy Ext: *** no edema Skin: warm and dry, no rash Neuro:  No gross deficits appreciated Psych: euthymic mood, full affect   EKG:  Done today and reviewed by myself shows  ***  TTE 03/04/2018  1. The left ventricle has normal systolic function of 04-59%. The cavity  size was normal. There is no increased left ventricular wall thickness.  Left ventricular diastology could not be evaluated secondary to atrial  fibrillation.   2. The right ventricle has normal systolic function. The cavity was  normal. There there are no ECGs that show atrial fibrillation no increase in right ventricular wall thickness.   3. Left atrial size was mildly dilated.   4. Normal RVSP   5. The inferior vena cava was dilated in size with <50% respiratory  variability.   Recent Labs: 06/20/2021: ALT 105; Magnesium 2.3 07/11/2021: BUN 17; Creatinine, Ser 0.83; Hemoglobin 13.8; Platelets 284; Potassium 4.3; Sodium 141  No results found for requested labs within last 365 days.   CrCl cannot be calculated (Patient's most recent lab result is older than the maximum 21 days allowed.).   Wt Readings from Last 3 Encounters:  08/20/21 232 lb (105.2 kg)  08/01/21 233 lb (105.7 kg)  07/15/21 233 lb (105.7 kg)     Other studies reviewed: Additional studies/records reviewed today include: summarized above  ASSESSMENT AND PLAN:  Persistent afib CHA2DS2Vasc is one, on  Eliquis, appropriately dosed ***  2. OSA Sees Dr. Radford Pax tomorrow ***   Disposition: F/u with [place] pending EKG and DCCV.  Current medicines are reviewed  at length with the patient today.  The patient did not have any concerns regarding medicines.  Venetia Night, PA-C 09/17/2021 7:00 PM     Tonya Nicholson [id number] [phone] (office)  [phone] (fax)

## 2021-09-18 ENCOUNTER — Encounter: Payer: Self-pay | Admitting: Physician Assistant

## 2021-09-18 ENCOUNTER — Telehealth: Payer: Self-pay | Admitting: *Deleted

## 2021-09-18 ENCOUNTER — Ambulatory Visit (INDEPENDENT_AMBULATORY_CARE_PROVIDER_SITE_OTHER): Payer: 59 | Admitting: Physician Assistant

## 2021-09-18 ENCOUNTER — Encounter (INDEPENDENT_AMBULATORY_CARE_PROVIDER_SITE_OTHER): Payer: Self-pay | Admitting: Adult Health

## 2021-09-18 ENCOUNTER — Ambulatory Visit (INDEPENDENT_AMBULATORY_CARE_PROVIDER_SITE_OTHER): Payer: 59 | Admitting: Adult Health

## 2021-09-18 ENCOUNTER — Ambulatory Visit (INDEPENDENT_AMBULATORY_CARE_PROVIDER_SITE_OTHER): Payer: 59 | Admitting: Cardiology

## 2021-09-18 ENCOUNTER — Encounter: Payer: Self-pay | Admitting: Cardiology

## 2021-09-18 VITALS — BP 110/76 | HR 71 | Ht 67.0 in | Wt 234.0 lb

## 2021-09-18 VITALS — BP 110/76 | HR 68 | Ht 67.0 in | Wt 234.0 lb

## 2021-09-18 VITALS — BP 113/74 | HR 66 | Temp 98.0°F | Ht 67.0 in | Wt 229.8 lb

## 2021-09-18 DIAGNOSIS — Z79899 Other long term (current) drug therapy: Secondary | ICD-10-CM | POA: Diagnosis not present

## 2021-09-18 DIAGNOSIS — Z5181 Encounter for therapeutic drug level monitoring: Secondary | ICD-10-CM | POA: Diagnosis not present

## 2021-09-18 DIAGNOSIS — Z6836 Body mass index (BMI) 36.0-36.9, adult: Secondary | ICD-10-CM | POA: Diagnosis not present

## 2021-09-18 DIAGNOSIS — G4733 Obstructive sleep apnea (adult) (pediatric): Secondary | ICD-10-CM | POA: Diagnosis not present

## 2021-09-18 DIAGNOSIS — T462X5A Adverse effect of other antidysrhythmic drugs, initial encounter: Secondary | ICD-10-CM

## 2021-09-18 DIAGNOSIS — I10 Essential (primary) hypertension: Secondary | ICD-10-CM | POA: Diagnosis not present

## 2021-09-18 DIAGNOSIS — E669 Obesity, unspecified: Secondary | ICD-10-CM

## 2021-09-18 NOTE — Telephone Encounter (Signed)
Order placed to [employer] via community message. auto CPAP from 4 to 15 cm H2O with heated humidity mask of choice  Nasal pillow/cushion mask   In person office visit with the medical supply

## 2021-09-18 NOTE — Telephone Encounter (Signed)
-----   Message from Jeanann Lewandowsky, [place] sent at 09/18/2021  1:17 PM EDT ----- -I think she would benefit from a trial of CPAP therapy and recommended auto CPAP from 4 to 15 cm H2O with heated humidity mask of choice -I will get a download in 6 weeks and check in [place] on CPAP to make sure she does not need supplemental oxygen  Nasal pillow/cushion mask  In person office visit with the medical supply

## 2021-09-18 NOTE — Addendum Note (Signed)
Addended by: Claude Manges on: 09/18/2021 01:42 PM   Modules accepted: Orders

## 2021-09-18 NOTE — Progress Notes (Signed)
Sleep Medicine CONSULT Note    Date:  09/18/2021   ID:  Tonya Nicholson, DOB Aug 17, 1960, MRN [id number]  PCP:  Linda Hedges, DO  Cardiologist:  Fransico Him, MD   Chief Complaint  Patient presents with   Sleep Apnea    History of Present Illness:  Tonya Nicholson is a 61 y.o. female who is being seen today for the evaluation of obstructive sleep apnea at the request of Reggy Eye, MD.  This is a 61 year old female with a history of GERD, PAF and hypertension who is followed by EP.  She was seen by Dr. Curt Bears and complained of problems with excessive daytime sleepiness as well as snoring while sleeping.  She underwent home sleep study showing mild obstructive sleep apnea with an AHI of 9.1/h and nocturnal hypoxemia with O2 sats as low as 73% with less than 88% O2 sats for 14.5 minutes during sleep.  She is now here to discuss the results of her study.  Past Medical History:  Diagnosis Date   Dyspnea    GERD (gastroesophageal reflux disease)    Hypertension    Joint pain    Knee pain    Lower extremity edema     Past Surgical History:  Procedure Laterality Date   CARDIOVERSION N/A 03/21/2018   Procedure: CARDIOVERSION;  Surgeon: Sueanne Margarita, MD;  [facility] ENDOSCOPY;  Service: Cardiovascular;  Laterality: N/A;   CARDIOVERSION N/A 03/31/2018   Procedure: CARDIOVERSION;  Surgeon: Constance Haw, MD;  Location: [place];  Service: Cardiovascular;  Laterality: N/A;   CARDIOVERSION N/A 10/06/2019   Procedure: CARDIOVERSION;  Surgeon: Constance Haw, MD;  Location: [place];  Service: Cardiovascular;  Laterality: N/A;   CARDIOVERSION N/A 08/01/2021   Procedure: CARDIOVERSION;  Surgeon: Constance Haw, MD;  Location: [place];  Service: Cardiovascular;  Laterality: N/A;   CHOLECYSTECTOMY N/A 06/19/2021   Procedure: LAPAROSCOPIC CHOLECYSTECTOMY;  Surgeon: Clovis Riley, MD;  Location: [place];  Service: General;  Laterality: N/A;    Current  Medications: Current Meds  Medication Sig   amLODipine-benazepril (LOTREL) 5-40 MG capsule Take 1 capsule by mouth daily.   apixaban (ELIQUIS) 5 MG TABS tablet Take 1 tablet (5 mg total) by mouth 2 (two) times daily.   flecainide (TAMBOCOR) 100 MG tablet Take 1 tablet (100 mg total) by mouth 2 (two) times daily.   metoprolol tartrate (LOPRESSOR) 25 MG tablet TAKE 1 TABLET (25 MG TOTAL) BY MOUTH 2 (TWO) TIMES DAILY.   Semaglutide-Weight Management (WEGOVY) 1.7 MG/0.75ML SOAJ Inject 1.7 mg into the skin once a week.    Allergies:   Patient has no known allergies.   Social History   Socioeconomic History   Marital status: Divorced    Spouse name: Not on file   Number of children: Not on file   Years of education: Not on file   Highest education level: Not on file  Occupational History   Occupation: Art therapist  Tobacco Use   Smoking status: Never   Smokeless tobacco: Never  Vaping Use   Vaping Use: Never used  Substance and Sexual Activity   Alcohol use: Not Currently    Alcohol/week: 9.0 standard drinks of alcohol    Types: 2 Shots of liquor, 7 Standard drinks or equivalent per week   Drug use: No   Sexual activity: Not Currently    Birth control/protection: Post-menopausal  Other Topics Concern   Not on file  Social History Narrative   Not on file  Social Determinants of Health   Financial Resource Strain: Not on file  Food Insecurity: No Food Insecurity (06/20/2021)   Hunger Vital Sign    Worried About Running Out of Food in the Last Year: Never true    Ran Out of Food in the Last Year: Never true  Transportation Needs: No Transportation Needs (06/20/2021)   PRAPARE - Hydrologist (Medical): No    Lack of Transportation (Non-Medical): No  Physical Activity: Not on file  Stress: Not on file  Social Connections: Not on file     Family History:  The patient's family history includes Atrial fibrillation in her mother; Bladder Cancer  in her father; Diabetes in her brother, father, and mother; Healthy in her sister; Heart attack in her brother; Heart disease in her father; Hypertension in her father and mother.   ROS:   Please see the history of present illness.    ROS All other systems reviewed and are negative.      No data to display             PHYSICAL EXAM:   VS:  BP 110/76   Pulse 71   Ht '5\' 7"'$  (1.702 m)   Wt 234 lb (106.1 kg)   LMP 04/09/2016 (Approximate)   SpO2 98%   BMI 36.65 kg/m    GEN: Well nourished, well developed, in no acute distress  HEENT: normal  Neck: no JVD, carotid bruits, or masses Cardiac: RRR; no murmurs, rubs, or gallops,no edema.  Intact distal pulses bilaterally.  Respiratory:  clear to auscultation bilaterally, normal work of breathing GI: soft, nontender, nondistended, + BS MS: no deformity or atrophy  Skin: warm and dry, no rash Neuro:  Alert and Oriented x 3, Strength and sensation are intact Psych: euthymic mood, full affect  Wt Readings from Last 3 Encounters:  09/18/21 234 lb (106.1 kg)  08/20/21 232 lb (105.2 kg)  08/01/21 233 lb (105.7 kg)      Studies/Labs Reviewed:   Home sleep study  Recent Labs: 06/20/2021: ALT 105; Magnesium 2.3 07/11/2021: BUN 17; Creatinine, Ser 0.83; Hemoglobin 13.8; Platelets 284; Potassium 4.3; Sodium 141    CHA2DS2-VASc Score = 2   This indicates a 2.2% annual risk of stroke. The patient's score is based upon: CHF History: 0 HTN History: 1 Diabetes History: 0 Stroke History: 0 Vascular Disease History: 0 Age Score: 0 Gender Score: 1            Additional studies/ records that were reviewed today include:  None    ASSESSMENT:    1. OSA (obstructive sleep apnea)      PLAN:  In order of problems listed above:  OSA  -Patient has complained of excessive daytime sleepiness and snoring -She was found to have mild obstructive sleep apnea with an AHI of 9.1/h with nocturnal hypoxemia -I think she would  benefit from a trial of CPAP therapy and recommended auto CPAP from 4 to 15 cm H2O with heated humidity mask of choice -I will get a download in 6 weeks and check in [place] on CPAP to make sure she does not need supplemental oxygen  Time Spent: 20 minutes total time of encounter, including 15 minutes spent in face-to-face patient care on the date of this encounter. This time includes coordination of care and counseling regarding above mentioned problem list. Remainder of non-face-to-face time involved reviewing chart documents/testing relevant to the patient encounter and documentation in the medical record. I  have independently reviewed documentation from referring provider  Medication Adjustments/Labs and Tests Ordered: Current medicines are reviewed at length with the patient today.  Concerns regarding medicines are outlined above.  Medication changes, Labs and Tests ordered today are listed in the Patient Instructions below.  There are no Patient Instructions on file for this visit.   Signed, Fransico Him, MD  09/18/2021 1:06 PM    [name] [address], [place], [place]  [address] Phone: [phone]; Fax: [phone]

## 2021-09-18 NOTE — Patient Instructions (Signed)
Medication Instructions:  Your physician recommends that you continue on your current medications as directed. Please refer to the Current Medication list given to you today.   *If you need a refill on your cardiac medications before your next appointment, please call your pharmacy*   Lab Work: [place]   If you have labs (blood work) drawn today and your tests are completely normal, you will receive your results only by: [place] (if you have MyChart) OR A paper copy in the mail If you have any lab test that is abnormal or we need to change your treatment, we will call you to review the results.   Testing/Procedures: NONE ORDERED  TODAY    Follow-Up: At [facility], you and your health needs are our priority.  As part of our continuing mission to provide you with exceptional heart care, we have created designated Provider Care Teams.  These Care Teams include your primary Cardiologist (physician) and Advanced Practice Providers (APPs -  Physician Assistants and Nurse Practitioners) who all work together to provide you with the care you need, when you need it.  We recommend signing up for the patient portal called "MyChart".  Sign up information is provided on this After Visit Summary.  MyChart is used to connect with patients for Virtual Visits (Telemedicine).  Patients are able to view lab/test results, encounter notes, upcoming appointments, etc.  Non-urgent messages can be sent to your provider as well.   To learn more about what you can do with MyChart, go to [email or web address].    Your next appointment:   6 month(s)  The format for your next appointment:   In Person  Provider:   Allegra Lai, MD   Other Instructions   Important Information About Sugar

## 2021-09-19 ENCOUNTER — Other Ambulatory Visit (HOSPITAL_COMMUNITY): Payer: Self-pay

## 2021-09-20 NOTE — Progress Notes (Unsigned)
Chief Complaint:   OBESITY Tonya Nicholson is here to discuss her progress with her obesity treatment plan along with follow-up of her obesity related diagnoses. Tonya Nicholson is on the Category 2 Plan and states she is following her eating plan approximately 50% of the time. Tonya Nicholson states she is in the gym 45-60 minutes 3-4 times per week.  Today's visit was #: 66 Starting weight: 263 lbs Starting date: 02/24/2018 Today's weight: 229 lbs Today's date: 09/18/2021 Total lbs lost to date: 34 lbs Total lbs lost since last in-office visit: 3 lbs  Interim History: Breakfast:  protein shake Lunch:  fruit, lean cuisine, meat., salad Gym Dinner:  skip or bite of yogurt  Subjective:   1. Essential hypertension ***  Assessment/Plan:   1. Essential hypertension ***  2. Obesity with current BMI 36.0 Add in protein at each meal.  Darshay is currently in the action stage of change. As such, her goal is to continue with weight loss efforts. She has agreed to the Category 2 Plan.   Exercise goals:  As is.   Behavioral modification strategies: increasing lean protein intake, decreasing simple carbohydrates, meal planning and cooking strategies, and keeping healthy foods in the home.  Tonya Nicholson has agreed to follow-up with our clinic in 4 weeks. She was informed of the importance of frequent follow-up visits to maximize her success with intensive lifestyle modifications for her multiple health conditions.   Objective:   Blood pressure 113/74, pulse 66, temperature 98 F (36.7 C), height '5\' 7"'$  (1.702 m), weight 229 lb 12.8 oz (104.2 kg), last menstrual period 04/09/2016, SpO2 98 %. Body mass index is 35.99 kg/m.  General: Cooperative, alert, well developed, in no acute distress. HEENT: Conjunctivae and lids unremarkable. Cardiovascular: Regular rhythm.  Lungs: Normal work of breathing. Neurologic: No focal deficits.   Lab Results  Component Value Date   CREATININE 0.83 07/11/2021   BUN 17  07/11/2021   NA 141 07/11/2021   K 4.3 07/11/2021   CL 104 07/11/2021   CO2 25 07/11/2021   Lab Results  Component Value Date   ALT 105 (H) 06/20/2021   AST 42 (H) 06/20/2021   ALKPHOS 65 06/20/2021   BILITOT 0.2 (L) 06/20/2021   Lab Results  Component Value Date   HGBA1C 5.5 07/15/2021   HGBA1C 5.2 06/18/2021   HGBA1C 5.3 04/10/2021   HGBA1C 6.0 (H) 09/26/2020   HGBA1C 6.0 (H) 04/25/2020   Lab Results  Component Value Date   INSULIN 12.8 04/10/2021   INSULIN 11.0 09/26/2020   INSULIN 9.4 04/25/2020   INSULIN 14.5 06/29/2019   INSULIN 8.2 01/31/2019   Lab Results  Component Value Date   TSH 1.410 02/24/2018   Lab Results  Component Value Date   CHOL 166 04/25/2020   HDL 54 04/25/2020   LDLCALC 98 04/25/2020   TRIG 71 04/25/2020   CHOLHDL 3.1 04/25/2020   Lab Results  Component Value Date   VD25OH 77.7 07/15/2021   VD25OH 58.6 04/10/2021   VD25OH 37.6 09/26/2020   Lab Results  Component Value Date   WBC 5.6 07/11/2021   HGB 13.8 07/11/2021   HCT 41.0 07/11/2021   MCV 90 07/11/2021   PLT 284 07/11/2021   No results found for: "IRON", "TIBC", "FERRITIN"  Attestation Statements:   Reviewed by clinician on day of visit: allergies, medications, problem list, medical history, surgical history, family history, social history, and previous encounter notes.  I, Davy Pique, RMA, am acting as Location manager for [employer],  NP.  I have reviewed the above documentation for accuracy and completeness, and I agree with the above. -  ***

## 2021-10-02 DIAGNOSIS — G4733 Obstructive sleep apnea (adult) (pediatric): Secondary | ICD-10-CM | POA: Diagnosis not present

## 2021-10-15 ENCOUNTER — Other Ambulatory Visit (HOSPITAL_COMMUNITY): Payer: Self-pay

## 2021-10-15 ENCOUNTER — Telehealth: Payer: Self-pay | Admitting: Internal Medicine

## 2021-10-15 NOTE — Telephone Encounter (Signed)
Good Afternoon Dr. [name],  Inbound call from patient requesting medical clearance, patient is currently taking eliquis. She had procedure scheduled for 5/26 that she needed to cancel. She has called to reschedule that appointment but given the fact she is taking a blood thinner I scheduled her for an office visit with APP. Patient is requesting to have her colonoscopy done 11/10 as that is the only day she has available.   She is now requesting to have a medical clearance to bypass the office visit and schedule colonoscopy. I advised her that I do not have a November calender out yet.  Please advise, thank you!

## 2021-10-16 ENCOUNTER — Encounter (INDEPENDENT_AMBULATORY_CARE_PROVIDER_SITE_OTHER): Payer: Self-pay | Admitting: Adult Health

## 2021-10-16 ENCOUNTER — Ambulatory Visit (INDEPENDENT_AMBULATORY_CARE_PROVIDER_SITE_OTHER): Payer: 59 | Admitting: Adult Health

## 2021-10-16 VITALS — BP 118/79 | HR 58 | Temp 98.1°F | Ht 67.0 in | Wt 233.0 lb

## 2021-10-16 DIAGNOSIS — R7303 Prediabetes: Secondary | ICD-10-CM | POA: Diagnosis not present

## 2021-10-16 DIAGNOSIS — Z6836 Body mass index (BMI) 36.0-36.9, adult: Secondary | ICD-10-CM | POA: Diagnosis not present

## 2021-10-16 DIAGNOSIS — E669 Obesity, unspecified: Secondary | ICD-10-CM

## 2021-10-20 NOTE — Progress Notes (Unsigned)
Chief Complaint:   OBESITY Tonya Nicholson is here to discuss her progress with her obesity treatment plan along with follow-up of her obesity related diagnoses. [name] is on the Category 2 Plan and states she is following her eating plan approximately 75% of the time. [name] states she has been in the gym but also on vacation 30-45 minutes 3-4 times per week.  Today's visit was #: 68 Starting weight: 263 lbs Starting date: 02/24/2018 Today's weight: 233 lbs Today's date: 10/16/2021 Total lbs lost to date: 30 Total lbs lost since last in-office visit: +4 lbs  Interim History: She recently traveled to [place], [place] to see extended family for 6 days. She has been tolerating Wegovy 1.7 mg once weekly injection.   Subjective:   1. Prediabetes A1c ***   Assessment/Plan:   1. Prediabetes Continue GLP-1 therapy.   2. Obesity with current BMI 36.6 Tonya Nicholson is currently in the action stage of change. As such, her goal is to continue with weight loss efforts. She has agreed to the Category 2 Plan.   Exercise goals:  As is.    Behavioral modification strategies: increasing lean protein intake, decreasing simple carbohydrates, meal planning and cooking strategies, keeping healthy foods in the home, travel eating strategies, and planning for success.  [name] has agreed to follow-up with our clinic in 3 weeks. She was informed of the importance of frequent follow-up visits to maximize her success with intensive lifestyle modifications for her multiple health conditions.   Objective:   Blood pressure 118/79, pulse (!) 58, temperature 98.1 F (36.7 C), height '5\' 7"'$  (1.702 m), weight 233 lb (105.7 kg), last menstrual period 04/09/2016, SpO2 98 %. Body mass index is 36.49 kg/m.  General: Cooperative, alert, well developed, in no acute distress. HEENT: Conjunctivae and lids unremarkable. Cardiovascular: Regular rhythm.  Lungs: Normal work of breathing. Neurologic: No focal deficits.   Lab  Results  Component Value Date   CREATININE 0.83 07/11/2021   BUN 17 07/11/2021   NA 141 07/11/2021   K 4.3 07/11/2021   CL 104 07/11/2021   CO2 25 07/11/2021   Lab Results  Component Value Date   ALT 105 (H) 06/20/2021   AST 42 (H) 06/20/2021   ALKPHOS 65 06/20/2021   BILITOT 0.2 (L) 06/20/2021   Lab Results  Component Value Date   HGBA1C 5.5 07/15/2021   HGBA1C 5.2 06/18/2021   HGBA1C 5.3 04/10/2021   HGBA1C 6.0 (H) 09/26/2020   HGBA1C 6.0 (H) 04/25/2020   Lab Results  Component Value Date   INSULIN 12.8 04/10/2021   INSULIN 11.0 09/26/2020   INSULIN 9.4 04/25/2020   INSULIN 14.5 06/29/2019   INSULIN 8.2 01/31/2019   Lab Results  Component Value Date   TSH 1.410 02/24/2018   Lab Results  Component Value Date   CHOL 166 04/25/2020   HDL 54 04/25/2020   LDLCALC 98 04/25/2020   TRIG 71 04/25/2020   CHOLHDL 3.1 04/25/2020   Lab Results  Component Value Date   VD25OH 77.7 07/15/2021   VD25OH 58.6 04/10/2021   VD25OH 37.6 09/26/2020   Lab Results  Component Value Date   WBC 5.6 07/11/2021   HGB 13.8 07/11/2021   HCT 41.0 07/11/2021   MCV 90 07/11/2021   PLT 284 07/11/2021   No results found for: "IRON", "TIBC", "FERRITIN"  Attestation Statements:   Reviewed by clinician on day of visit: allergies, medications, problem list, medical history, surgical history, family history, social history, and previous encounter notes.  I, Davy Pique, RMA, am acting as Location manager for Mina Marble, NP.  I have reviewed the above documentation for accuracy and completeness, and I agree with the above. -  ***

## 2021-11-01 DIAGNOSIS — G4733 Obstructive sleep apnea (adult) (pediatric): Secondary | ICD-10-CM | POA: Diagnosis not present

## 2021-11-03 NOTE — Telephone Encounter (Signed)
An appointment in the [place] has been booked for her on 12/05/21 as requested. Her appointment will be at 9:00 am. Only the appointment has been scheduled.

## 2021-11-06 ENCOUNTER — Encounter (INDEPENDENT_AMBULATORY_CARE_PROVIDER_SITE_OTHER): Payer: Self-pay | Admitting: Adult Health

## 2021-11-06 ENCOUNTER — Ambulatory Visit (INDEPENDENT_AMBULATORY_CARE_PROVIDER_SITE_OTHER): Payer: 59 | Admitting: Adult Health

## 2021-11-06 ENCOUNTER — Other Ambulatory Visit (HOSPITAL_COMMUNITY): Payer: Self-pay

## 2021-11-06 VITALS — BP 100/62 | HR 69 | Temp 97.7°F | Ht 67.0 in | Wt 225.0 lb

## 2021-11-06 DIAGNOSIS — Z6836 Body mass index (BMI) 36.0-36.9, adult: Secondary | ICD-10-CM | POA: Diagnosis not present

## 2021-11-06 DIAGNOSIS — E669 Obesity, unspecified: Secondary | ICD-10-CM

## 2021-11-06 DIAGNOSIS — I480 Paroxysmal atrial fibrillation: Secondary | ICD-10-CM

## 2021-11-06 MED ORDER — WEGOVY 1.7 MG/0.75ML ~~LOC~~ SOAJ
1.7000 mg | SUBCUTANEOUS | 0 refills | Status: DC
  Filled 2021-11-06 – 2021-11-17 (×2): qty 9, 84d supply, fill #0

## 2021-11-14 ENCOUNTER — Other Ambulatory Visit (HOSPITAL_COMMUNITY): Payer: Self-pay

## 2021-11-17 ENCOUNTER — Other Ambulatory Visit (INDEPENDENT_AMBULATORY_CARE_PROVIDER_SITE_OTHER): Payer: Self-pay | Admitting: Cardiology

## 2021-11-17 ENCOUNTER — Other Ambulatory Visit (HOSPITAL_COMMUNITY): Payer: Self-pay

## 2021-11-17 DIAGNOSIS — I10 Essential (primary) hypertension: Secondary | ICD-10-CM

## 2021-11-17 NOTE — Progress Notes (Signed)
Chief Complaint:   OBESITY Tonya Nicholson is here to discuss her progress with her obesity treatment plan along with follow-up of her obesity related diagnoses. Tonya Nicholson is on the Category 2 Plan and states she is following her eating plan approximately 70% of the time. Tonya Nicholson states she is doing gym workouts 30-45 minutes 4 times per week.  Today's visit was #: 70 Starting weight: 263 lbs Starting date: 02/24/2018 Today's weight: 225 lbs Today's date: 11/06/2021 Total lbs lost to date: 38 lbs Total lbs lost since last in-office visit: 8 lbs  Interim History:   Tonya Nicholson is leaving tomorrow to travel to [place], then onto an 8 day cruise.  She will be travelling with her daughter, son-in-law and his parents.  Discussed travel strategies at length.  She is on Wegovy 1.'7mg'$  weekly injection- denies mass in neck, dysphagia, dyspepsia, persistent hoarseness, or N/V/Constipation.  Subjective:   1. PAF (paroxysmal atrial fibrillation) ([place]) Followed by Dr. Baird Kay- last OV 05/29/2021 Last Cardioversion was 08/01/21. She denies palpitations.  Currently on Lopressor '25mg'$  BID, Eliquis '5mg'$  QD, Tambocor '100mg'$  BID, Lotrel 5-'40mg'$  QD  Assessment/Plan:   1. PAF (paroxysmal atrial fibrillation) ([facility]) Continue Lopressor '25mg'$  BID, Eliquis '5mg'$  QD, Tambocor '100mg'$  BID, Lotrel 5-'40mg'$  QD  2. Obesity, current BMI 36.4 Refill - Semaglutide-Weight Management (WEGOVY) 1.7 MG/0.75ML SOAJ; Inject 1.7 mg into the skin once a week.  Dispense: 9 mL; Refill: 0  Tonya Nicholson is currently in the action stage of change. As such, her goal is to continue with weight loss efforts. She has agreed to the Category 2 Plan.   Exercise goals:  As is.   Behavioral modification strategies: increasing lean protein intake, decreasing simple carbohydrates, meal planning and cooking strategies, keeping healthy foods in the home, travel eating strategies, and planning for success.  Tonya Nicholson has agreed to follow-up with our clinic in 4 weeks.  She was informed of the importance of frequent follow-up visits to maximize her success with intensive lifestyle modifications for her multiple health conditions.  Objective:   Blood pressure 100/62, pulse 69, temperature 97.7 F (36.5 C), height '5\' 7"'$  (1.702 m), weight 225 lb (102.1 kg), last menstrual period 04/09/2016, SpO2 97 %. Body mass index is 35.24 kg/m.  General: Cooperative, alert, well developed, in no acute distress. HEENT: Conjunctivae and lids unremarkable. Cardiovascular: Regular rhythm.  Lungs: Normal work of breathing. Neurologic: No focal deficits.   Lab Results  Component Value Date   CREATININE 0.83 07/11/2021   BUN 17 07/11/2021   NA 141 07/11/2021   K 4.3 07/11/2021   CL 104 07/11/2021   CO2 25 07/11/2021   Lab Results  Component Value Date   ALT 105 (H) 06/20/2021   AST 42 (H) 06/20/2021   ALKPHOS 65 06/20/2021   BILITOT 0.2 (L) 06/20/2021   Lab Results  Component Value Date   HGBA1C 5.5 07/15/2021   HGBA1C 5.2 06/18/2021   HGBA1C 5.3 04/10/2021   HGBA1C 6.0 (H) 09/26/2020   HGBA1C 6.0 (H) 04/25/2020   Lab Results  Component Value Date   INSULIN 12.8 04/10/2021   INSULIN 11.0 09/26/2020   INSULIN 9.4 04/25/2020   INSULIN 14.5 06/29/2019   INSULIN 8.2 01/31/2019   Lab Results  Component Value Date   TSH 1.410 02/24/2018   Lab Results  Component Value Date   CHOL 166 04/25/2020   HDL 54 04/25/2020   LDLCALC 98 04/25/2020   TRIG 71 04/25/2020   CHOLHDL 3.1 04/25/2020   Lab Results  Component  Value Date   VD25OH 77.7 07/15/2021   VD25OH 58.6 04/10/2021   VD25OH 37.6 09/26/2020   Lab Results  Component Value Date   WBC 5.6 07/11/2021   HGB 13.8 07/11/2021   HCT 41.0 07/11/2021   MCV 90 07/11/2021   PLT 284 07/11/2021   No results found for: "IRON", "TIBC", "FERRITIN"  Attestation Statements:   Reviewed by clinician on day of visit: allergies, medications, problem list, medical history, surgical history, family history,  social history, and previous encounter notes.  I, Davy Pique, RMA, am acting as Location manager for Mina Marble, NP.  I have reviewed the above documentation for accuracy and completeness, and I agree with the above. -  Hortence Charter d. Parks Czajkowski, NP-C

## 2021-11-18 ENCOUNTER — Other Ambulatory Visit (HOSPITAL_COMMUNITY): Payer: Self-pay

## 2021-11-18 MED ORDER — METOPROLOL TARTRATE 25 MG PO TABS
25.0000 mg | ORAL_TABLET | Freq: Two times a day (BID) | ORAL | 3 refills | Status: DC
  Filled 2021-11-18 (×2): qty 180, 90d supply, fill #0
  Filled 2022-02-20: qty 180, 90d supply, fill #1
  Filled 2022-05-19 – 2022-05-20 (×2): qty 180, 90d supply, fill #2
  Filled 2022-08-25: qty 180, 90d supply, fill #3

## 2021-11-20 ENCOUNTER — Ambulatory Visit (INDEPENDENT_AMBULATORY_CARE_PROVIDER_SITE_OTHER): Payer: 59 | Admitting: Adult Health

## 2021-11-20 ENCOUNTER — Ambulatory Visit (INDEPENDENT_AMBULATORY_CARE_PROVIDER_SITE_OTHER): Payer: 59 | Admitting: Gastroenterology

## 2021-11-20 ENCOUNTER — Other Ambulatory Visit (HOSPITAL_COMMUNITY): Payer: Self-pay

## 2021-11-20 ENCOUNTER — Encounter: Payer: Self-pay | Admitting: Gastroenterology

## 2021-11-20 ENCOUNTER — Ambulatory Visit: Payer: 59 | Attending: Cardiology | Admitting: Cardiology

## 2021-11-20 ENCOUNTER — Telehealth: Payer: Self-pay

## 2021-11-20 ENCOUNTER — Encounter: Payer: Self-pay | Admitting: Cardiology

## 2021-11-20 ENCOUNTER — Encounter (INDEPENDENT_AMBULATORY_CARE_PROVIDER_SITE_OTHER): Payer: Self-pay | Admitting: Adult Health

## 2021-11-20 VITALS — BP 134/72 | HR 56 | Ht 67.0 in | Wt 223.0 lb

## 2021-11-20 VITALS — BP 111/75 | HR 57 | Temp 97.7°F | Ht 67.0 in | Wt 223.0 lb

## 2021-11-20 VITALS — BP 124/64 | HR 61 | Ht 67.0 in | Wt 228.2 lb

## 2021-11-20 DIAGNOSIS — I4819 Other persistent atrial fibrillation: Secondary | ICD-10-CM

## 2021-11-20 DIAGNOSIS — I10 Essential (primary) hypertension: Secondary | ICD-10-CM | POA: Diagnosis not present

## 2021-11-20 DIAGNOSIS — R0602 Shortness of breath: Secondary | ICD-10-CM

## 2021-11-20 DIAGNOSIS — R7303 Prediabetes: Secondary | ICD-10-CM

## 2021-11-20 DIAGNOSIS — Z1211 Encounter for screening for malignant neoplasm of colon: Secondary | ICD-10-CM | POA: Diagnosis not present

## 2021-11-20 DIAGNOSIS — D6869 Other thrombophilia: Secondary | ICD-10-CM | POA: Diagnosis not present

## 2021-11-20 DIAGNOSIS — Z6835 Body mass index (BMI) 35.0-35.9, adult: Secondary | ICD-10-CM

## 2021-11-20 DIAGNOSIS — Z7901 Long term (current) use of anticoagulants: Secondary | ICD-10-CM | POA: Diagnosis not present

## 2021-11-20 DIAGNOSIS — E669 Obesity, unspecified: Secondary | ICD-10-CM | POA: Diagnosis not present

## 2021-11-20 DIAGNOSIS — E66812 Obesity, class 2: Secondary | ICD-10-CM

## 2021-11-20 DIAGNOSIS — Z13820 Encounter for screening for osteoporosis: Secondary | ICD-10-CM | POA: Diagnosis not present

## 2021-11-20 MED ORDER — NA SULFATE-K SULFATE-MG SULF 17.5-3.13-1.6 GM/177ML PO SOLN
1.0000 | Freq: Once | ORAL | 0 refills | Status: AC
  Filled 2021-11-20: qty 354, 1d supply, fill #0

## 2021-11-20 NOTE — Telephone Encounter (Signed)
   Patient Name: Tonya Nicholson  DOB: 1961-01-05 MRN: [id number]  Primary Cardiologist: Will Meredith Leeds, MD  Chart reviewed as part of pre-operative protocol coverage. Pharmacy clearance only. Per office protocols and pharmacist review , Tonya Nicholson may hold Eliquis 1 day prior to planned procedure.   I will route this recommendation to the requesting party via Epic fax function and remove from pre-op pool.  Please call with questions.  Loel Dubonnet, NP 11/20/2021, 3:43 PM

## 2021-11-20 NOTE — Progress Notes (Signed)
I agree with the assessment and plan as outlined by Ms. Myrtice Lauth. I typically recommend holding Eliquis 2 days before colonoscopy.

## 2021-11-20 NOTE — Telephone Encounter (Signed)
Informed patient she can hold Eliquis 1 day prior to her procedure. Patient verbalized understanding.

## 2021-11-20 NOTE — Telephone Encounter (Signed)
Patient on Eliquis for atrial fibrillation. Will route to pharmacy team for input.    CHA2DS2-VASc Score = 2   This indicates a 2.2% annual risk of stroke. The patient's score is based upon: CHF History: 0 HTN History: 1 Diabetes History: 0 Stroke History: 0 Vascular Disease History: 0 Age Score: 0 Gender Score: 1      Platelet count: 07/11/2021: Platelets 284   Creatinine clearance: 88m/min (adjusted for weight)  07/11/2021: Creatinine, Ser 0.83; Hemoglobin 13.8     CLoel Dubonnet NP  11/20/21  12:29 PM

## 2021-11-20 NOTE — Patient Instructions (Signed)
You have been scheduled for a colonoscopy. Please follow written instructions given to you at your visit today.  Please pick up your prep supplies at the pharmacy within the next 1-3 days. If you use inhalers (even only as needed), please bring them with you on the day of your procedure.  The [facility] providers would like to encourage you to use MYCHART to communicate with providers for non-urgent requests or questions.  Due to long hold times on the telephone, sending your provider a message by MYCHART may be a faster and more efficient way to get a response.  Please allow 48 business hours for a response.  Please remember that this is for non-urgent requests.   Due to recent changes in healthcare laws, you may see the results of your imaging and laboratory studies on MyChart before your provider has had a chance to review them.  We understand that in some cases there may be results that are confusing or concerning to you. Not all laboratory results come back in the same time frame and the provider may be waiting for multiple results in order to interpret others.  Please give us 48 hours in order for your provider to thoroughly review all the results before contacting the office for clarification of your results.    

## 2021-11-20 NOTE — Progress Notes (Signed)
Electrophysiology Office Note   Date:  11/20/2021   ID:  SEBRENA ENGH, DOB 07-May-1960, MRN [id number]  PCP:  Linda Hedges, DO  Cardiologist:   Primary Electrophysiologist:  Xian Alves Meredith Leeds, MD    No chief complaint on file.     History of Present Illness: Tonya Nicholson is a 61 y.o. female who is being seen today for the evaluation of atrial fibrillation at the request of [name], [name], DO. Presenting today for electrophysiology evaluation.    She has a history significant for obesity and hypertension as well as sleep apnea.  She went to her primary physician's office and was found to be in atrial fibrillation with symptoms of fatigue and shortness of breath.  She had a cardioversion but went back into atrial fibrillation and has been started on flecainide.  Today, denies symptoms of palpitations, chest pain, shortness of breath, orthopnea, PND, lower extremity edema, claudication, dizziness, presyncope, syncope, bleeding, or neurologic sequela. The patient is tolerating medications without difficulties.  Since her cardioversion she has done well.  She has been traveling.  When she was traveling she did not wear her CPAP, but otherwise she remained in sinus rhythm.  She has quite a bit more energy.    Past Medical History:  Diagnosis Date   Dyspnea    GERD (gastroesophageal reflux disease)    Hypertension    Joint pain    Knee pain    Lower extremity edema    Past Surgical History:  Procedure Laterality Date   CARDIOVERSION N/A 03/21/2018   Procedure: CARDIOVERSION;  Surgeon: Sueanne Margarita, MD;  Location: [facility] ENDOSCOPY;  Service: Cardiovascular;  Laterality: N/A;   CARDIOVERSION N/A 03/31/2018   Procedure: CARDIOVERSION;  Surgeon: Constance Haw, MD;  Location: [place];  Service: Cardiovascular;  Laterality: N/A;   CARDIOVERSION N/A 10/06/2019   Procedure: CARDIOVERSION;  Surgeon: Constance Haw, MD;  Location: [place];  Service: Cardiovascular;   Laterality: N/A;   CARDIOVERSION N/A 08/01/2021   Procedure: CARDIOVERSION;  Surgeon: Constance Haw, MD;  Location: [place];  Service: Cardiovascular;  Laterality: N/A;   CHOLECYSTECTOMY N/A 06/19/2021   Procedure: LAPAROSCOPIC CHOLECYSTECTOMY;  Surgeon: Clovis Riley, MD;  Location: [place];  Service: General;  Laterality: N/A;     Current Outpatient Medications  Medication Sig Dispense Refill   amLODipine-benazepril (LOTREL) 5-40 MG capsule Take 1 capsule by mouth daily. 90 capsule 0   apixaban (ELIQUIS) 5 MG TABS tablet Take 1 tablet (5 mg total) by mouth 2 (two) times daily. 180 tablet 1   augmented betamethasone dipropionate (DIPROLENE-AF) 0.05 % cream APPLY TOPICALLY TO AFFECTED AREA TWICE DAILY AS NEEDED. NOT TO FACE, GROIN, AND UNDERARMS.     flecainide (TAMBOCOR) 100 MG tablet Take 1 tablet (100 mg total) by mouth 2 (two) times daily. 180 tablet 3   metoprolol tartrate (LOPRESSOR) 25 MG tablet Take 1 tablet (25 mg total) by mouth 2 (two) times daily. 180 tablet 3   Semaglutide-Weight Management (WEGOVY) 1.7 MG/0.75ML SOAJ Inject 1.7 mg into the skin once a week. 9 mL 0   traMADol (ULTRAM) 50 MG tablet Take 1 tablet (50 mg total) by mouth every 8 (eight) hours as needed for moderate pain. 10 tablet 0   No current facility-administered medications for this visit.    Allergies:   Patient has no known allergies.   Social History:  The patient  reports that she has never smoked. She has never used smokeless tobacco. She reports that  she does not currently use alcohol after a past usage of about 9.0 standard drinks of alcohol per week. She reports that she does not use drugs.   Family History:  The patient's family history includes Atrial fibrillation in her mother; Bladder Cancer in her father; Diabetes in her brother, father, and mother; Healthy in her sister; Heart attack in her brother; Heart disease in her father; Hypertension in her father and mother.   ROS:  Please see  the history of present illness.   Otherwise, review of systems is positive for none.   All other systems are reviewed and negative.   PHYSICAL EXAM: VS:  BP 134/72   Pulse (!) 56   Ht '5\' 7"'$  (1.702 m)   Wt 223 lb (101.2 kg)   LMP 04/09/2016 (Approximate)   SpO2 97%   BMI 34.93 kg/m  , BMI Body mass index is 34.93 kg/m. GEN: Well nourished, well developed, in no acute distress  HEENT: normal  Neck: no JVD, carotid bruits, or masses Cardiac: RRR; no murmurs, rubs, or gallops,no edema  Respiratory:  clear to auscultation bilaterally, normal work of breathing GI: soft, nontender, nondistended, + BS MS: no deformity or atrophy  Skin: warm and dry Neuro:  Strength and sensation are intact Psych: euthymic mood, full affect  EKG:  EKG is ordered today. Personal review of the ekg ordered shows sinus rhythm  Recent Labs: 06/20/2021: ALT 105; Magnesium 2.3 07/11/2021: BUN 17; Creatinine, Ser 0.83; Hemoglobin 13.8; Platelets 284; Potassium 4.3; Sodium 141    Lipid Panel     Component Value Date/Time   CHOL 166 04/25/2020 1001   TRIG 71 04/25/2020 1001   HDL 54 04/25/2020 1001   CHOLHDL 3.1 04/25/2020 1001   LDLCALC 98 04/25/2020 1001     Wt Readings from Last 3 Encounters:  11/20/21 223 lb (101.2 kg)  11/20/21 223 lb (101.2 kg)  11/06/21 225 lb (102.1 kg)      Other studies Reviewed: Additional studies/ records that were reviewed today include: TTE 03/04/2018  1. The left ventricle has normal systolic function of 30-86%. The cavity  size was normal. There is no increased left ventricular wall thickness.  Left ventricular diastology could not be evaluated secondary to atrial  fibrillation.   2. The right ventricle has normal systolic function. The cavity was  normal. There there are no ECGs that show atrial fibrillation no increase in right ventricular wall thickness.   3. Left atrial size was mildly dilated.   4. Normal RVSP   5. The inferior vena cava was dilated in size  with <50% respiratory  variability.   ASSESSMENT AND PLAN:  1.  Persistent atrial fibrillation: Currently on Eliquis 5 mg twice daily, flecainide 100 mg twice daily, metoprolol 25 mg daily.  CHA2DS2-VASc of 2.  High risk medication monitoring for flecainide.  She has had 2 cardioversions in the past.  She fortunately remains in sinus rhythm.  Overall quite happy with her control.  No changes.  2.  Hypertension: Currently well controlled  3.  Secondary hypercoagulable state: Currently on Eliquis for atrial fibrillation as above  4.  Obstructive sleep apnea: CPAP compliance encouraged   Current medicines are reviewed at length with the patient today.   The patient does not have concerns regarding her medicines.  The following changes were made today: None  Labs/ tests ordered today include:  Orders Placed This Encounter  Procedures   EKG 12-Lead     Disposition:   FU 6 months  Signed, Hyman Crossan Meredith Leeds, MD  11/20/2021 10:00 AM     [facility] [place] [address] [phone] (office) [phone] (fax)

## 2021-11-20 NOTE — Telephone Encounter (Signed)
Patient with diagnosis of afib on Eliquis for anticoagulation.    Procedure:  colonoscopy  Date of procedure: 12/05/21   CHA2DS2-VASc Score = 2   This indicates a 2.2% annual risk of stroke. The patient's score is based upon: CHF History: 0 HTN History: 1 Diabetes History: 0 Stroke History: 0 Vascular Disease History: 0 Age Score: 0 Gender Score: 1      CrCl 88 ml/min Platelet count 284  Per office protocol, patient can hold Eliquis for 1 day prior to procedure.    **This guidance is not considered finalized until pre-operative APP has relayed final recommendations.**

## 2021-11-20 NOTE — Progress Notes (Signed)
11/20/2021 Tonya Nicholson [id number] 08-25-1960   HISTORY OF PRESENT ILLNESS: This is a 60 year old female who was seen earlier this year to schedule a screening colonoscopy, but had to cancel because she had to have emergent gallbladder surgery.  She is now back again to reschedule and actually tentatively on for 11/10.  She is on Eliquis for atrial fibrillation.  She had a cardioversion in July and remains in sinus rhythm.  Dr. Curt Bears is her cardiologist and she just saw him this morning.  She's never had a colonoscopy in the past.  She denies any GI complaints.   Past Medical History:  Diagnosis Date   Dyspnea    GERD (gastroesophageal reflux disease)    Hypertension    Joint pain    Knee pain    Lower extremity edema    Past Surgical History:  Procedure Laterality Date   CARDIOVERSION N/A 03/21/2018   Procedure: CARDIOVERSION;  Surgeon: Sueanne Margarita, MD;  Location[facility] ENDOSCOPY;  Service: Cardiovascular;  Laterality: N/A;   CARDIOVERSION N/A 03/31/2018   Procedure: CARDIOVERSION;  Surgeon: Constance Haw, MD;  Location: [employer];  Service: Cardiovascular;  Laterality: N/A;   CARDIOVERSION N/A 10/06/2019   Procedure: CARDIOVERSION;  Surgeon: Constance Haw, MD;  Location: [place];  Service: Cardiovascular;  Laterality: N/A;   CARDIOVERSION N/A 08/01/2021   Procedure: CARDIOVERSION;  Surgeon: Constance Haw, MD;  Location: [place];  Service: Cardiovascular;  Laterality: N/A;   CHOLECYSTECTOMY N/A 06/19/2021   Procedure: LAPAROSCOPIC CHOLECYSTECTOMY;  Surgeon: Clovis Riley, MD;  Location: [place];  Service: General;  Laterality: N/A;    reports that she has never smoked. She has never used smokeless tobacco. She reports that she does not currently use alcohol after a past usage of about 9.0 standard drinks of alcohol per week. She reports that she does not use drugs. family history includes Atrial fibrillation in her mother; Bladder Cancer in  her father; Diabetes in her brother, father, and mother; Healthy in her sister; Heart attack in her brother; Heart disease in her father; Hypertension in her father and mother. No Known Allergies    Outpatient Encounter Medications as of 11/20/2021  Medication Sig   amLODipine-benazepril (LOTREL) 5-40 MG capsule Take 1 capsule by mouth daily.   apixaban (ELIQUIS) 5 MG TABS tablet Take 1 tablet (5 mg total) by mouth 2 (two) times daily.   augmented betamethasone dipropionate (DIPROLENE-AF) 0.05 % cream APPLY TOPICALLY TO AFFECTED AREA TWICE DAILY AS NEEDED. NOT TO FACE, GROIN, AND UNDERARMS.   flecainide (TAMBOCOR) 100 MG tablet Take 1 tablet (100 mg total) by mouth 2 (two) times daily.   metoprolol tartrate (LOPRESSOR) 25 MG tablet Take 1 tablet (25 mg total) by mouth 2 (two) times daily.   Semaglutide-Weight Management (WEGOVY) 1.7 MG/0.75ML SOAJ Inject 1.7 mg into the skin once a week.   traMADol (ULTRAM) 50 MG tablet Take 1 tablet (50 mg total) by mouth every 8 (eight) hours as needed for moderate pain.   No facility-administered encounter medications on file as of 11/20/2021.     REVIEW OF SYSTEMS  : All other systems reviewed and negative except where noted in the History of Present Illness.   PHYSICAL EXAM: BP 124/64   Pulse 61   Ht '5\' 7"'$  (1.702 m)   Wt 228 lb 4 oz (103.5 kg)   LMP 04/09/2016 (Approximate)   BMI 35.75 kg/m  General: Well developed white female in no acute distress Head: Normocephalic  and atraumatic Eyes:  Sclerae anicteric, conjunctiva pink. Ears: Normal auditory acuity Lungs: Clear throughout to auscultation; no W/R/R. Heart: Regular rate and rhythm; no M/R/G. Abdomen: Soft, non-distended.  BS present.  Non-tender. Rectal:  Will be done at the time of colonoscopy. Musculoskeletal: Symmetrical with no gross deformities  Skin: No lesions on visible extremities Extremities: No edema  Neurological: Alert oriented x 4, grossly non-focal Psychological:   Alert and cooperative. Normal mood and affect  ASSESSMENT AND PLAN: *CRC screening:  Never had colonoscopy in the past.  Scheduled with Dr. [name] for 11/10. *Chronic anticoagulation with Eliquis for history of atrial fibrillation:  Will hold Eliquis for one day prior to endoscopic procedures - will instruct when and how to resume after procedure. Benefits and risks of procedure explained including risks of bleeding, perforation, infection, missed lesions, reactions to medications and possible need for hospitalization and surgery for complications. Additional rare but real risk of stroke or other vascular clotting events off of Eliquis also explained and need to seek urgent help if any signs of these problems occur. Will communicate by phone or EMR with patient's prescribing provider to confirm that holding Eliquis is reasonable in this case.    CC:  Linda Hedges, DO

## 2021-11-20 NOTE — Telephone Encounter (Signed)
[facility] Pre-operative Risk Assessment     Request for surgical clearance:     Endoscopy Procedure  What type of surgery is being performed?     colonoscopy  When is this surgery scheduled?     12/05/21  What type of clearance is required ?   Pharmacy  Are there any medications that need to be held prior to surgery and how long? Eliquis 24 hours  Practice name and name of physician performing surgery?      [facility]  What is your office phone and fax number?      Phone- [phone]  [phone]  Anesthesia type (None, local, MAC, general) ?       MAC

## 2021-11-22 NOTE — Progress Notes (Unsigned)
Chief Complaint:   OBESITY Tonya Nicholson is here to discuss her progress with her obesity treatment plan along with follow-up of her obesity related diagnoses. Tonya Nicholson is on the Category 2 Plan and states she is following her eating plan approximately 75% of the time. Tonya Nicholson states she is at the gym 30-45 minutes 3-4 times per week.  Today's visit was #: 58 Starting weight: 263 lbs Starting date: 02/24/2018 Today's weight: 223 lbs Today's date: 11/20/2021 Total lbs lost to date: 40 lbs Total lbs lost since last in-office visit: 2 lbs  Interim History: During her 8 day cruise she mainly consumed fresh fruit and lean protein.  She visited the ship gym and walked frequently.  Bioimpedance results:  muscle mass +1.2 lbs, adipose mass -2.8 lbs.  Subjective:   1. Prediabetes A1c ***  2. SOB (shortness of breath) on exertion *** 12/26/2018, RMR 1491.  Assessment/Plan:   1. Prediabetes Continue Category 2 meal plan and weekly ***  2. SOB (shortness of breath) on exertion Recheck IC at next office visit.   3. Obesity, current BMI 35.0 Check fasting labs and IC at next office visit. Pt is aware to arrive 30 minutes prior to appointment time.   Tonya Nicholson is currently in the action stage of change. As such, her goal is to continue with weight loss efforts. She has agreed to the Category 2 Plan.   Exercise goals:  As is.   Behavioral modification strategies: increasing lean protein intake, decreasing simple carbohydrates, meal planning and cooking strategies, keeping healthy foods in the home, and planning for success.  Tonya Nicholson has agreed to follow-up with our clinic in 8 weeks. She was informed of the importance of frequent follow-up visits to maximize her success with intensive lifestyle modifications for her multiple health conditions.   Objective:   Blood pressure 111/75, pulse (!) 57, temperature 97.7 F (36.5 C), height '5\' 7"'$  (1.702 m), weight 223 lb (101.2 kg), last menstrual period  04/09/2016, SpO2 97 %. Body mass index is 34.93 kg/m.  General: Cooperative, alert, well developed, in no acute distress. HEENT: Conjunctivae and lids unremarkable. Cardiovascular: Regular rhythm.  Lungs: Normal work of breathing. Neurologic: No focal deficits.   Lab Results  Component Value Date   CREATININE 0.83 07/11/2021   BUN 17 07/11/2021   NA 141 07/11/2021   K 4.3 07/11/2021   CL 104 07/11/2021   CO2 25 07/11/2021   Lab Results  Component Value Date   ALT 105 (H) 06/20/2021   AST 42 (H) 06/20/2021   ALKPHOS 65 06/20/2021   BILITOT 0.2 (L) 06/20/2021   Lab Results  Component Value Date   HGBA1C 5.5 07/15/2021   HGBA1C 5.2 06/18/2021   HGBA1C 5.3 04/10/2021   HGBA1C 6.0 (H) 09/26/2020   HGBA1C 6.0 (H) 04/25/2020   Lab Results  Component Value Date   INSULIN 12.8 04/10/2021   INSULIN 11.0 09/26/2020   INSULIN 9.4 04/25/2020   INSULIN 14.5 06/29/2019   INSULIN 8.2 01/31/2019   Lab Results  Component Value Date   TSH 1.410 02/24/2018   Lab Results  Component Value Date   CHOL 166 04/25/2020   HDL 54 04/25/2020   LDLCALC 98 04/25/2020   TRIG 71 04/25/2020   CHOLHDL 3.1 04/25/2020   Lab Results  Component Value Date   VD25OH 77.7 07/15/2021   VD25OH 58.6 04/10/2021   VD25OH 37.6 09/26/2020   Lab Results  Component Value Date   WBC 5.6 07/11/2021   HGB 13.8 07/11/2021  HCT 41.0 07/11/2021   MCV 90 07/11/2021   PLT 284 07/11/2021   No results found for: "IRON", "TIBC", "FERRITIN"  Attestation Statements:   Reviewed by clinician on day of visit: allergies, medications, problem list, medical history, surgical history, family history, social history, and previous encounter notes.  I, Davy Pique, RMA, am acting as Location manager for Mina Marble, NP.  I have reviewed the above documentation for accuracy and completeness, and I agree with the above. -  ***

## 2021-11-24 NOTE — Telephone Encounter (Signed)
Tonya Creamer, MD Physician Signed    11/20/2021   Copy   I agree with the assessment and plan as outlined by Ms. Myrtice Lauth. I typically recommend holding Eliquis 2 days before colonoscopy.

## 2021-11-24 NOTE — Telephone Encounter (Signed)
Dr. [name] would like patient to hold Eliquis 2 days for the procedure. Is this ok to hold? Pre-op already approved 1 day hold.

## 2021-11-24 NOTE — Telephone Encounter (Signed)
Yes, ok to hold 2 days

## 2021-11-24 NOTE — Telephone Encounter (Signed)
   Name: [name]  DOB: Feb 06, 1960  MRN: [id number]  Primary Cardiologist: Constance Haw, MD  Chart reviewed as part of pre-operative protocol coverage. Because of [name]'s past medical history and time since last visit, she will require a follow-up telephone visit in order to better assess preoperative cardiovascular risk.  Pre-op covering staff: - Please schedule appointment and call patient to inform them. If patient already had an upcoming appointment within acceptable timeframe, please add "pre-op clearance" to the appointment notes so provider is aware. - Please contact requesting surgeon's office via preferred method (i.e, phone, fax) to inform them of need for appointment prior to surgery.  According to clinical pharmD's recommendations as mentioned below, okay to hold Eliquis 2 days prior to procedure.   Finis Bud, NP  11/24/2021, 4:59 PM

## 2021-11-24 NOTE — Telephone Encounter (Signed)
I s/w the pt and she tells me that she just saw Dr. Curt Bears 11/20/21 and that he cleared her. I do not see the ov notes stating the pt has been cleared. Nitzia, works in our cath lab. I assured Claritza that I will d/w Dr. Curt Bears and pre op provider tomorrow. I stated that it sounds like it is just a quick fix that Dr. Curt Bears needs to amend his ov note stating he has cleared her and give the ok as well to holding Eliquis x 2 days. I assured Kelie, that I will talk to her tomorrow and get this taken care for her. Kieanna thanked me for the help. I assured Aeva that I will call her back tomorrow.

## 2021-11-25 NOTE — Telephone Encounter (Signed)
Pt has been made aware she has been cleared and ok to hold Eliquis x 2 days prior to procedure. Clearance notes have been sent to requesting office. Pt thanked me for the help.

## 2021-11-25 NOTE — Telephone Encounter (Signed)
   Patient Name: Tonya Nicholson  DOB: 1960-07-25 MRN: [id number]  Primary Cardiologist: Will Meredith Leeds, MD  Chart reviewed as part of pre-operative protocol coverage. Pre-op clearance already addressed by colleagues in earlier phone notes. To summarize recommendations:  -Intermediate risk for low risk procedure. Ok to hold anticoagulation. No further testing needed.  -Dr. Curt Bears  Will route this bundled recommendation to requesting provider via Epic fax function and remove from pre-op pool. Please call with questions.  Elgie Collard, PA-C 11/25/2021, 8:31 AM

## 2021-11-26 DIAGNOSIS — R0602 Shortness of breath: Secondary | ICD-10-CM | POA: Insufficient documentation

## 2021-11-27 ENCOUNTER — Other Ambulatory Visit (HOSPITAL_COMMUNITY): Payer: Self-pay

## 2021-11-27 MED ORDER — AMOXICILLIN 500 MG PO CAPS
500.0000 mg | ORAL_CAPSULE | Freq: Three times a day (TID) | ORAL | 0 refills | Status: DC
  Filled 2021-11-27: qty 21, 7d supply, fill #0

## 2021-11-28 ENCOUNTER — Encounter: Payer: Self-pay | Admitting: Internal Medicine

## 2021-12-02 DIAGNOSIS — G4733 Obstructive sleep apnea (adult) (pediatric): Secondary | ICD-10-CM | POA: Diagnosis not present

## 2021-12-05 ENCOUNTER — Encounter: Payer: Self-pay | Admitting: Internal Medicine

## 2021-12-05 ENCOUNTER — Encounter: Payer: 59 | Admitting: Internal Medicine

## 2021-12-05 ENCOUNTER — Ambulatory Visit (AMBULATORY_SURGERY_CENTER): Payer: 59 | Admitting: Internal Medicine

## 2021-12-05 VITALS — BP 145/77 | HR 65 | Temp 97.2°F | Resp 16 | Ht 67.0 in | Wt 228.0 lb

## 2021-12-05 DIAGNOSIS — D123 Benign neoplasm of transverse colon: Secondary | ICD-10-CM

## 2021-12-05 DIAGNOSIS — Z1211 Encounter for screening for malignant neoplasm of colon: Secondary | ICD-10-CM

## 2021-12-05 DIAGNOSIS — D122 Benign neoplasm of ascending colon: Secondary | ICD-10-CM | POA: Diagnosis not present

## 2021-12-05 HISTORY — PX: COLONOSCOPY: SHX174

## 2021-12-05 MED ORDER — SODIUM CHLORIDE 0.9 % IV SOLN: 500.0000 mL | Freq: Once | INTRAVENOUS | Status: DC

## 2021-12-05 NOTE — Progress Notes (Signed)
Called to room to assist during endoscopic procedure.  Patient ID and intended procedure confirmed with present staff. Received instructions for my participation in the procedure from the performing physician.  

## 2021-12-05 NOTE — Progress Notes (Signed)
GASTROENTEROLOGY PROCEDURE H&P NOTE   Primary Care Physician: Linda Hedges, DO    Reason for Procedure:   Colon cancer screening  Plan:    Colonoscopy  Patient is appropriate for endoscopic procedure(s) in the ambulatory ([facility]) setting.  The nature of the procedure, as well as the risks, benefits, and alternatives were carefully and thoroughly reviewed with the patient. Ample time for discussion and questions allowed. The patient understood, was satisfied, and agreed to proceed.     HPI: Tonya Nicholson is a 61 y.o. female who presents for colonoscopy for evaluation of colon cancer screening .  Patient was most recently seen in the Gastroenterology Clinic on 11/20/21.  No interval change in medical history since that appointment. Please refer to that note for full details regarding GI history and clinical presentation.   Past Medical History:  Diagnosis Date   Atrial fibrillation (HCC)    Dyspnea    GERD (gastroesophageal reflux disease)    Hypertension    Joint pain    Knee pain    Lower extremity edema    Sleep apnea    uses cpap    Past Surgical History:  Procedure Laterality Date   CARDIOVERSION N/A 03/21/2018   Procedure: CARDIOVERSION;  Surgeon: Sueanne Margarita, MD;  Location: [place];  Service: Cardiovascular;  Laterality: N/A;   CARDIOVERSION N/A 03/31/2018   Procedure: CARDIOVERSION;  Surgeon: Constance Haw, MD;  Location: [place];  Service: Cardiovascular;  Laterality: N/A;   CARDIOVERSION N/A 10/06/2019   Procedure: CARDIOVERSION;  Surgeon: Constance Haw, MD;  Location: [place];  Service: Cardiovascular;  Laterality: N/A;   CARDIOVERSION N/A 08/01/2021   Procedure: CARDIOVERSION;  Surgeon: Constance Haw, MD;  Location: [facility] ENDOSCOPY;  Service: Cardiovascular;  Laterality: N/A;   CHOLECYSTECTOMY N/A 06/19/2021   Procedure: LAPAROSCOPIC CHOLECYSTECTOMY;  Surgeon: Clovis Riley, MD;  Location: [facility] OR;  Service: General;   Laterality: N/A;   COLONOSCOPY  12/05/2021    Prior to Admission medications   Medication Sig Start Date End Date Taking? Authorizing Provider  amLODipine-benazepril (LOTREL) 5-40 MG capsule Take 1 capsule by mouth daily. 08/07/21  Yes   flecainide (TAMBOCOR) 100 MG tablet Take 1 tablet (100 mg total) by mouth 2 (two) times daily. 07/11/21  Yes Baldwin Jamaica, PA-C  metoprolol tartrate (LOPRESSOR) 25 MG tablet Take 1 tablet (25 mg total) by mouth 2 (two) times daily. 11/18/21  Yes Baldwin Jamaica, PA-C  amoxicillin (AMOXIL) 500 MG capsule Take 1 capsule (500 mg total) by mouth 3 (three) times daily. Patient not taking: Reported on 12/05/2021 11/27/21     apixaban (ELIQUIS) 5 MG TABS tablet Take 1 tablet (5 mg total) by mouth 2 (two) times daily. 06/26/21   Camnitz, Ocie Doyne, MD  augmented betamethasone dipropionate (DIPROLENE-AF) 0.05 % cream APPLY TOPICALLY TO AFFECTED AREA TWICE DAILY AS NEEDED. NOT TO FACE, GROIN, AND UNDERARMS. Patient not taking: Reported on 12/05/2021    [provider]  Semaglutide-Weight Management (WEGOVY) 1.7 MG/0.75ML SOAJ Inject 1.7 mg into the skin once a week. 11/06/21   Mina Marble D, NP    Current Outpatient Medications  Medication Sig Dispense Refill   amLODipine-benazepril (LOTREL) 5-40 MG capsule Take 1 capsule by mouth daily. 90 capsule 0   flecainide (TAMBOCOR) 100 MG tablet Take 1 tablet (100 mg total) by mouth 2 (two) times daily. 180 tablet 3   metoprolol tartrate (LOPRESSOR) 25 MG tablet Take 1 tablet (25 mg total) by mouth 2 (two)  times daily. 180 tablet 3   amoxicillin (AMOXIL) 500 MG capsule Take 1 capsule (500 mg total) by mouth 3 (three) times daily. (Patient not taking: Reported on 12/05/2021) 21 capsule 0   apixaban (ELIQUIS) 5 MG TABS tablet Take 1 tablet (5 mg total) by mouth 2 (two) times daily. 180 tablet 1   augmented betamethasone dipropionate (DIPROLENE-AF) 0.05 % cream APPLY TOPICALLY TO AFFECTED AREA TWICE DAILY AS  NEEDED. NOT TO FACE, GROIN, AND UNDERARMS. (Patient not taking: Reported on 12/05/2021)     Semaglutide-Weight Management (WEGOVY) 1.7 MG/0.75ML SOAJ Inject 1.7 mg into the skin once a week. 9 mL 0   Current Facility-Administered Medications  Medication Dose Route Frequency Provider Last Rate Last Admin   0.9 %  sodium chloride infusion  500 mL Intravenous Once Sharyn Creamer, MD        Allergies as of 12/05/2021   (No Known Allergies)    Family History  Problem Relation Age of Onset   Diabetes Mother    Hypertension Mother    Atrial fibrillation Mother    Diabetes Father    Hypertension Father    Heart disease Father        multiple PCI & CABGx3   Bladder Cancer Father    Healthy Sister    Diabetes Brother    Heart attack Brother        died in 04/14/2016   Colon polyps Neg Hx    Stomach cancer Neg Hx    Esophageal cancer Neg Hx     Social History   Socioeconomic History   Marital status: Divorced    Spouse name: Not on file   Number of children: Not on file   Years of education: Not on file   Highest education level: Not on file  Occupational History   Occupation: Art therapist  Tobacco Use   Smoking status: Never   Smokeless tobacco: Never  Vaping Use   Vaping Use: Never used  Substance and Sexual Activity   Alcohol use: Not Currently    Comment: 2 drinks per week   Drug use: No   Sexual activity: Not Currently    Birth control/protection: Post-menopausal  Other Topics Concern   Not on file  Social History Narrative   Not on file   Social Determinants of Health   Financial Resource Strain: Not on file  Food Insecurity: No Food Insecurity (06/20/2021)   Hunger Vital Sign    Worried About Running Out of Food in the Last Year: Never true    Ran Out of Food in the Last Year: Never true  Transportation Needs: No Transportation Needs (06/20/2021)   PRAPARE - Hydrologist (Medical): No    Lack of Transportation (Non-Medical): No   Physical Activity: Not on file  Stress: Not on file  Social Connections: Not on file  Intimate Partner Violence: Not on file    Physical Exam: Vital signs in last 24 hours: BP 107/63   Pulse 64   Temp (!) 97.2 F (36.2 C)   Ht '5\' 7"'$  (1.702 m)   Wt 228 lb (103.4 kg)   LMP 04/09/2016 (Approximate)   SpO2 98%   BMI 35.71 kg/m  GEN: NAD EYE: Sclerae anicteric ENT: MMM CV: Non-tachycardic Pulm: No increased WOB GI: Soft NEURO:  Alert & Oriented   Christia Reading, MD [facility]   12/05/2021 8:37 AM

## 2021-12-05 NOTE — Progress Notes (Signed)
Sedate, gd SR, tolerated procedure well, VSS, report to RN 

## 2021-12-05 NOTE — Op Note (Addendum)
[place] Patient Name: Tonya Nicholson Procedure Date: 12/05/2021 9:09 AM MRN: [id number] Endoscopist: Adline Mango Bovey , , [id number] Age: 61 Referring MD:  Date of Birth: May 11, 1960 Gender: Female Account #: [id number] Procedure:                Colonoscopy Indications:              Screening for colorectal malignant neoplasm, This                            is the patient's first colonoscopy Medicines:                Monitored Anesthesia Care Procedure:                Pre-Anesthesia Assessment:                           - Prior to the procedure, a History and Physical                            was performed, and patient medications and                            allergies were reviewed. The patient's tolerance of                            previous anesthesia was also reviewed. The risks                            and benefits of the procedure and the sedation                            options and risks were discussed with the patient.                            All questions were answered, and informed consent                            was obtained. Prior Anticoagulants: The patient has                            taken Eliquis (apixaban), last dose was 2 days                            prior to procedure. ASA Grade Assessment: II - A                            patient with mild systemic disease. After reviewing                            the risks and benefits, the patient was deemed in                            satisfactory condition to undergo the procedure.  After obtaining informed consent, the colonoscope                            was passed under direct vision. Throughout the                            procedure, the patient's blood pressure, pulse, and                            oxygen saturations were monitored continuously. The                            CF [id number] #[id number] was introduced through the anus                             and advanced to the the terminal ileum. The                            colonoscopy was performed without difficulty. The                            patient tolerated the procedure well. The quality                            of the bowel preparation was good. The terminal                            ileum, ileocecal valve, appendiceal orifice, and                            rectum were photographed. Scope In: 9:20:37 AM Scope Out: 9:38:10 AM Scope Withdrawal Time: 0 hours 11 minutes 50 seconds  Total Procedure Duration: 0 hours 16 minutes 7 seconds  Findings:                 The terminal ileum appeared normal.                           Three sessile polyps were found in the transverse                            colon and ascending colon. The polyps were 3 to 8                            mm in size. These polyps were removed with a cold                            snare. Resection and retrieval were complete.                           Multiple diverticula were found in the sigmoid                            colon.  Non-bleeding internal hemorrhoids were found during                            retroflexion. Complications:            No immediate complications. Estimated Blood Loss:     Estimated blood loss was minimal. Impression:               - The examined portion of the ileum was normal.                           - Three 3 to 8 mm polyps in the transverse colon                            and in the ascending colon, removed with a cold                            snare. Resected and retrieved.                           - Diverticulosis in the sigmoid colon.                           - Non-bleeding internal hemorrhoids. Recommendation:           - Discharge patient to home (with escort).                           - Await pathology results.                           - Okay to restart your Eliquis tomorrow.                           - The findings and  recommendations were discussed                            with the patient. Dr Georgian Co "[name]" [name],  12/05/2021 9:39:54 AM

## 2021-12-05 NOTE — Patient Instructions (Addendum)
-  Restart Eliquis ( Apixaban) 12/06/2021  -Hand out on polyps, hemorrhoids, and diverticulosis provided. -Discharge patient to home (with escort). - Await pathology results. - The findings and recommendations were discussed with the patient.  YOU HAD AN ENDOSCOPIC PROCEDURE TODAY AT [facility]:   Refer to the procedure report that was given to you for any specific questions about what was found during the examination.  If the procedure report does not answer your questions, please call your gastroenterologist to clarify.  If you requested that your care partner not be given the details of your procedure findings, then the procedure report has been included in a sealed envelope for you to review at your convenience later.  YOU SHOULD EXPECT: Some feelings of bloating in the abdomen. Passage of more gas than usual.  Walking can help get rid of the air that was put into your GI tract during the procedure and reduce the bloating. If you had a lower endoscopy (such as a colonoscopy or flexible sigmoidoscopy) you may notice spotting of blood in your stool or on the toilet paper. If you underwent a bowel prep for your procedure, you may not have a normal bowel movement for a few days.  Please Note:  You might notice some irritation and congestion in your nose or some drainage.  This is from the oxygen used during your procedure.  There is no need for concern and it should clear up in a day or so.  SYMPTOMS TO REPORT IMMEDIATELY:  Following lower endoscopy (colonoscopy or flexible sigmoidoscopy):  Excessive amounts of blood in the stool  Significant tenderness or worsening of abdominal pains  Swelling of the abdomen that is new, acute  Fever of 100F or higher  For urgent or emergent issues, a gastroenterologist can be reached at any hour by calling [phone]. Do not use MyChart messaging for urgent concerns.    DIET:  We do recommend a small meal at first, but then you may  proceed to your regular diet.  Drink plenty of fluids but you should avoid alcoholic beverages for 24 hours.  ACTIVITY:  You should plan to take it easy for the rest of today and you should NOT DRIVE or use heavy machinery until tomorrow (because of the sedation medicines used during the test).    FOLLOW UP: Our staff will call the number listed on your records the next business day following your procedure.  We will call around 7:15- 8:00 am to check on you and address any questions or concerns that you may have regarding the information given to you following your procedure. If we do not reach you, we will leave a message.     If any biopsies were taken you will be contacted by phone or by letter within the next 1-3 weeks.  Please call us at [phone] if you have not heard about the biopsies in 3 weeks.    SIGNATURES/CONFIDENTIALITY: You and/or your care partner have signed paperwork which will be entered into your electronic medical record.  These signatures attest to the fact that that the information above on your After Visit Summary has been reviewed and is understood.  Full responsibility of the confidentiality of this discharge information lies with you and/or your care-partner.

## 2021-12-08 ENCOUNTER — Telehealth: Payer: Self-pay

## 2021-12-08 NOTE — Telephone Encounter (Signed)
No answer, left message to call if having any issue or concerns, B.Anthonymichael Munday RN. 

## 2021-12-10 ENCOUNTER — Encounter: Payer: Self-pay | Admitting: Internal Medicine

## 2021-12-15 ENCOUNTER — Other Ambulatory Visit (HOSPITAL_COMMUNITY): Payer: Self-pay

## 2021-12-16 ENCOUNTER — Other Ambulatory Visit (HOSPITAL_COMMUNITY): Payer: Self-pay

## 2021-12-16 ENCOUNTER — Other Ambulatory Visit: Payer: Self-pay | Admitting: Physician Assistant

## 2021-12-16 MED ORDER — AMLODIPINE BESY-BENAZEPRIL HCL 5-40 MG PO CAPS
1.0000 | ORAL_CAPSULE | Freq: Every day | ORAL | 3 refills | Status: DC
  Filled 2021-12-16: qty 90, 90d supply, fill #0
  Filled 2022-03-18: qty 90, 90d supply, fill #1
  Filled 2022-06-16: qty 90, 90d supply, fill #2
  Filled 2022-08-25 – 2022-09-10 (×2): qty 90, 90d supply, fill #3

## 2022-01-01 ENCOUNTER — Ambulatory Visit (INDEPENDENT_AMBULATORY_CARE_PROVIDER_SITE_OTHER): Payer: 59 | Admitting: Adult Health

## 2022-01-01 ENCOUNTER — Other Ambulatory Visit (HOSPITAL_COMMUNITY): Payer: Self-pay

## 2022-01-01 DIAGNOSIS — G4733 Obstructive sleep apnea (adult) (pediatric): Secondary | ICD-10-CM | POA: Diagnosis not present

## 2022-01-01 MED ORDER — HYDROCODONE-ACETAMINOPHEN 5-325 MG PO TABS
1.0000 | ORAL_TABLET | Freq: Four times a day (QID) | ORAL | 0 refills | Status: DC | PRN
  Filled 2022-01-01: qty 15, 4d supply, fill #0

## 2022-01-08 ENCOUNTER — Ambulatory Visit (INDEPENDENT_AMBULATORY_CARE_PROVIDER_SITE_OTHER): Payer: 59 | Admitting: Family Medicine

## 2022-01-16 ENCOUNTER — Other Ambulatory Visit (HOSPITAL_COMMUNITY): Payer: Self-pay

## 2022-01-16 ENCOUNTER — Other Ambulatory Visit: Payer: Self-pay | Admitting: Cardiology

## 2022-01-16 MED ORDER — APIXABAN 5 MG PO TABS
5.0000 mg | ORAL_TABLET | Freq: Two times a day (BID) | ORAL | 1 refills | Status: DC
  Filled 2022-01-16: qty 180, 90d supply, fill #0
  Filled 2022-04-16 (×2): qty 180, 90d supply, fill #1

## 2022-01-16 NOTE — Telephone Encounter (Signed)
Prescription refill request for Eliquis received. Indication:afib Last office visit:10/23 Scr:0.8 Age: 61 Weight:103.4  kg  Prescription refilled

## 2022-01-20 ENCOUNTER — Other Ambulatory Visit (HOSPITAL_COMMUNITY): Payer: Self-pay

## 2022-01-29 ENCOUNTER — Ambulatory Visit (INDEPENDENT_AMBULATORY_CARE_PROVIDER_SITE_OTHER): Payer: 59 | Admitting: Adult Health

## 2022-02-05 ENCOUNTER — Ambulatory Visit (INDEPENDENT_AMBULATORY_CARE_PROVIDER_SITE_OTHER): Payer: 59 | Admitting: Adult Health

## 2022-02-05 ENCOUNTER — Encounter (INDEPENDENT_AMBULATORY_CARE_PROVIDER_SITE_OTHER): Payer: Self-pay | Admitting: Adult Health

## 2022-02-05 ENCOUNTER — Other Ambulatory Visit (HOSPITAL_COMMUNITY): Payer: Self-pay

## 2022-02-05 VITALS — BP 107/71 | HR 56 | Temp 97.8°F | Ht 67.0 in | Wt 223.0 lb

## 2022-02-05 DIAGNOSIS — I10 Essential (primary) hypertension: Secondary | ICD-10-CM | POA: Diagnosis not present

## 2022-02-05 DIAGNOSIS — R7303 Prediabetes: Secondary | ICD-10-CM

## 2022-02-05 DIAGNOSIS — Z6839 Body mass index (BMI) 39.0-39.9, adult: Secondary | ICD-10-CM

## 2022-02-05 DIAGNOSIS — Z6835 Body mass index (BMI) 35.0-35.9, adult: Secondary | ICD-10-CM

## 2022-02-05 DIAGNOSIS — R0602 Shortness of breath: Secondary | ICD-10-CM

## 2022-02-05 DIAGNOSIS — E559 Vitamin D deficiency, unspecified: Secondary | ICD-10-CM | POA: Diagnosis not present

## 2022-02-05 DIAGNOSIS — E669 Obesity, unspecified: Secondary | ICD-10-CM

## 2022-02-05 MED ORDER — WEGOVY 1.7 MG/0.75ML ~~LOC~~ SOAJ
1.7000 mg | SUBCUTANEOUS | 0 refills | Status: DC
  Filled 2022-02-05: qty 3, 28d supply, fill #0
  Filled 2022-02-09 – 2022-02-26 (×2): qty 9, 84d supply, fill #0
  Filled 2022-03-27 – 2022-03-30 (×3): qty 3, 28d supply, fill #0
  Filled 2022-03-30: qty 9, 84d supply, fill #0

## 2022-02-06 LAB — COMPREHENSIVE METABOLIC PANEL
ALT: 13 IU/L (ref 0–32)
AST: 18 IU/L (ref 0–40)
Albumin/Globulin Ratio: 1.5 (ref 1.2–2.2)
Albumin: 4.3 g/dL (ref 3.9–4.9)
Alkaline Phosphatase: 97 IU/L (ref 44–121)
BUN/Creatinine Ratio: 19 (ref 12–28)
BUN: 16 mg/dL (ref 8–27)
Bilirubin Total: 0.5 mg/dL (ref 0.0–1.2)
CO2: 23 mmol/L (ref 20–29)
Calcium: 9.8 mg/dL (ref 8.7–10.3)
Chloride: 103 mmol/L (ref 96–106)
Creatinine, Ser: 0.86 mg/dL (ref 0.57–1.00)
Globulin, Total: 2.9 g/dL (ref 1.5–4.5)
Glucose: 85 mg/dL (ref 70–99)
Potassium: 4.8 mmol/L (ref 3.5–5.2)
Sodium: 141 mmol/L (ref 134–144)
Total Protein: 7.2 g/dL (ref 6.0–8.5)
eGFR: 77 mL/min/{1.73_m2} (ref >59)

## 2022-02-06 LAB — HEMOGLOBIN A1C
Est. average glucose Bld gHb Est-mCnc: 100 mg/dL
Hgb A1c MFr Bld: 5.1 % (ref 4.8–5.6)

## 2022-02-06 LAB — VITAMIN B12: Vitamin B-12: 370 pg/mL (ref 232–1245)

## 2022-02-06 LAB — INSULIN, RANDOM: INSULIN: 4.9 u[IU]/mL (ref 2.6–24.9)

## 2022-02-06 LAB — VITAMIN D 25 HYDROXY (VIT D DEFICIENCY, FRACTURES): Vit D, 25-Hydroxy: 48.6 ng/mL (ref 30.0–100.0)

## 2022-02-09 ENCOUNTER — Other Ambulatory Visit (HOSPITAL_COMMUNITY): Payer: Self-pay
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
## 2022-02-26 ENCOUNTER — Other Ambulatory Visit (HOSPITAL_COMMUNITY): Payer: Self-pay

## 2022-02-26 ENCOUNTER — Ambulatory Visit (INDEPENDENT_AMBULATORY_CARE_PROVIDER_SITE_OTHER): Payer: 59 | Admitting: Adult Health

## 2022-02-26 ENCOUNTER — Encounter (INDEPENDENT_AMBULATORY_CARE_PROVIDER_SITE_OTHER): Payer: Self-pay | Admitting: Adult Health

## 2022-02-26 VITALS — BP 111/73 | HR 56 | Temp 97.7°F | Ht 67.0 in | Wt 228.0 lb

## 2022-02-26 DIAGNOSIS — R7303 Prediabetes: Secondary | ICD-10-CM

## 2022-02-26 DIAGNOSIS — Z6835 Body mass index (BMI) 35.0-35.9, adult: Secondary | ICD-10-CM

## 2022-02-26 DIAGNOSIS — E669 Obesity, unspecified: Secondary | ICD-10-CM

## 2022-03-11 NOTE — Progress Notes (Signed)
Chief Complaint:   OBESITY Tonya Nicholson is here to discuss her progress with her obesity treatment plan along with follow-up of her obesity related diagnoses. Tonya Nicholson is on the Category 1 Plan and states she is following her eating plan approximately 70% of the time. Tonya Nicholson states she is going to the gym 30 minutes 2 times per week.  Today's visit was #: 68 Starting weight: 263 lbs Starting date: 02/24/2018 Today's weight: 228 lbs Today's date: 02/26/2022 Total lbs lost to date: 35 lbs Total lbs lost since last in-office visit: +5 lbs  Interim History:  Tonya Nicholson medical insurance changed to [employer].  She has the "Save" [phone] therapy appears to be covered under her new plan.  She is currently on weekly Wegovy 1.35m injections- she denies mass in neck, dysphagia, dyspepsia, persistent hoarseness, abdominal pan, or N/V/C She reports having 2 months of Wegovy 131mat home.  [place] in [place] this weekend.  Subjective:   1. Prediabetes Dicussed Labs Lab Results  Component Value Date   HGBA1C 5.1 02/05/2022   HGBA1C 5.5 07/15/2021   HGBA1C 5.2 06/18/2021    02/05/22 CMP- electrolytes and Kidney fx stable. 02/05/22 B12 level- 370 02/05/22- Insuline Level- 4.9  She is currently on weekly Wegovy 1.86m42mdenies medication SE.  Assessment/Plan:   1. Prediabetes Continue on GLP-1 therapy as directed.   2. Obesity, current BMI 35.7 Tonya Nicholson currently in the action stage of change. As such, her goal is to continue with weight loss efforts. She has agreed to the Category 1 Plan.   Exercise goals: For substantial health benefits, adults should do at least 150 minutes (2 hours and 30 minutes) a week of moderate-intensity, or 75 minutes (1 hour and 15 minutes) a week of vigorous-intensity aerobic physical activity, or an equivalent combination of moderate- and vigorous-intensity aerobic activity. Aerobic activity should be performed in episodes of at least 10 minutes,  and preferably, it should be spread throughout the week.  Behavioral modification strategies: increasing lean protein intake, decreasing simple carbohydrates, meal planning and cooking strategies, keeping healthy foods in the home, and planning for success.  Tonya Nicholson agreed to follow-up with our clinic in 4 weeks. She was informed of the importance of frequent follow-up visits to maximize her success with intensive lifestyle modifications for her multiple health conditions.   Objective:   Blood pressure 111/73, pulse (!) 56, temperature 97.7 F (36.5 C), height 5' 7"$  (1.702 m), weight 228 lb (103.4 kg), last menstrual period 04/09/2016, SpO2 99 %. Body mass index is 35.71 kg/m.  General: Cooperative, alert, well developed, in no acute distress. HEENT: Conjunctivae and lids unremarkable. Cardiovascular: Regular rhythm.  Lungs: Normal work of breathing. Neurologic: No focal deficits.   Lab Results  Component Value Date   CREATININE 0.86 02/05/2022   BUN 16 02/05/2022   NA 141 02/05/2022   K 4.8 02/05/2022   CL 103 02/05/2022   CO2 23 02/05/2022   Lab Results  Component Value Date   ALT 13 02/05/2022   AST 18 02/05/2022   ALKPHOS 97 02/05/2022   BILITOT 0.5 02/05/2022   Lab Results  Component Value Date   HGBA1C 5.1 02/05/2022   HGBA1C 5.5 07/15/2021   HGBA1C 5.2 06/18/2021   HGBA1C 5.3 04/10/2021   HGBA1C 6.0 (H) 09/26/2020   Lab Results  Component Value Date   INSULIN 4.9 02/05/2022   INSULIN 12.8 04/10/2021   INSULIN 11.0 09/26/2020   INSULIN 9.4 04/25/2020  INSULIN 14.5 06/29/2019   Lab Results  Component Value Date   TSH 1.410 02/24/2018   Lab Results  Component Value Date   CHOL 166 04/25/2020   HDL 54 04/25/2020   LDLCALC 98 04/25/2020   TRIG 71 04/25/2020   CHOLHDL 3.1 04/25/2020   Lab Results  Component Value Date   VD25OH 48.6 02/05/2022   VD25OH 77.7 07/15/2021   VD25OH 58.6 04/10/2021   Lab Results  Component Value Date   WBC 5.6  07/11/2021   HGB 13.8 07/11/2021   HCT 41.0 07/11/2021   MCV 90 07/11/2021   PLT 284 07/11/2021   No results found for: "IRON", "TIBC", "FERRITIN"  Attestation Statements:   Reviewed by clinician on day of visit: allergies, medications, problem list, medical history, surgical history, family history, social history, and previous encounter notes.  I, Davy Pique, RMA, am acting as Location manager for Mina Marble, NP.  I have reviewed the above documentation for accuracy and completeness, and I agree with the above. -  Erice Ahles d. Benson Porcaro, NP-C

## 2022-03-15 DIAGNOSIS — G4733 Obstructive sleep apnea (adult) (pediatric): Secondary | ICD-10-CM | POA: Diagnosis not present

## 2022-03-19 ENCOUNTER — Other Ambulatory Visit (HOSPITAL_COMMUNITY): Payer: Self-pay

## 2022-03-26 ENCOUNTER — Ambulatory Visit (INDEPENDENT_AMBULATORY_CARE_PROVIDER_SITE_OTHER): Payer: 59 | Admitting: Adult Health

## 2022-03-26 ENCOUNTER — Encounter (INDEPENDENT_AMBULATORY_CARE_PROVIDER_SITE_OTHER): Payer: Self-pay | Admitting: Adult Health

## 2022-03-26 VITALS — BP 123/79 | HR 54 | Temp 97.6°F | Ht 67.0 in | Wt 229.0 lb

## 2022-03-26 DIAGNOSIS — Z6835 Body mass index (BMI) 35.0-35.9, adult: Secondary | ICD-10-CM | POA: Diagnosis not present

## 2022-03-26 DIAGNOSIS — R7303 Prediabetes: Secondary | ICD-10-CM | POA: Diagnosis not present

## 2022-03-26 DIAGNOSIS — E559 Vitamin D deficiency, unspecified: Secondary | ICD-10-CM

## 2022-03-26 DIAGNOSIS — E669 Obesity, unspecified: Secondary | ICD-10-CM

## 2022-03-26 NOTE — Progress Notes (Signed)
Chief Complaint:   OBESITY Tonya Nicholson is here to discuss her progress with her obesity treatment plan along with follow-up of her obesity related diagnoses. Tonya Nicholson is on the Category 1 Plan and states she is following her eating plan approximately 50% of the time.  Tonya Nicholson states she is exercising at gym 30 minutes 2 times per week.  Today's visit was #: 65 Starting weight: 263 lbs Starting date: 02/24/2018 Today's weight: 229 lbs Today's date: 03/26/2022 Total lbs lost to date: -34 lbs Total lbs lost since last in-office visit: + 1 lb  Interim History:  We discussed [place] new policy on GLP-1 coverage for obesity tx. She is currently on Wegovy 1.'7mg'$  injects every 7-10 days. Denies mass in neck, dysphagia, dyspepsia, persistent hoarseness, abdominal pain, or N/V/C   She has 6-8 weeks of Wegovy '1mg'$  at home.  She will speak with Pharmacy on whether or not she will RF last Weogvy 1.'7mg'$  Rx  We discussed risks/benefits of Metformin therapy- Handout Provided  Subjective:   1. Prediabetes Discussed Labs Lab Results  Component Value Date   HGBA1C 5.1 02/05/2022   HGBA1C 5.5 07/15/2021   HGBA1C 5.2 06/18/2021   Insulin level 4.9- excellent She is currently on Wegovy 1.'7mg'$  injection Denies mass in neck, dysphagia, dyspepsia, persistent hoarseness, abdominal pain, or N/V/C   2. Vitamin D deficiency Discussed Labs  Latest Reference Range & Units 02/05/22 11:12  Vitamin D, 25-Hydroxy 30.0 - 100.0 ng/mL 48.6  Vitamin B12 232 - 1,245 pg/mL 370  She recently started taking OTC Vit D3 5,000 IU daily instead of once weekly.   Assessment/Plan:   1. Prediabetes Increase plan compliance to at least 80% Continue Wegovy as directed  2. Vitamin D deficiency Continue daily OTC Vit D3 5,000 IU  3. Obesity, current BMI 35.86  Tonya Nicholson is currently in the action stage of change. As such, her goal is to continue with weight loss efforts. She has agreed to the Category 1 Plan.    Handouts: Metformin Information Sheet  Exercise goals: For substantial health benefits, adults should do at least 150 minutes (2 hours and 30 minutes) a week of moderate-intensity, or 75 minutes (1 hour and 15 minutes) a week of vigorous-intensity aerobic physical activity, or an equivalent combination of moderate- and vigorous-intensity aerobic activity. Aerobic activity should be performed in episodes of at least 10 minutes, and preferably, it should be spread throughout the week.  Behavioral modification strategies: increasing lean protein intake, decreasing simple carbohydrates, increasing vegetables, increasing water intake, no skipping meals, meal planning and cooking strategies, keeping healthy foods in the home, better snacking choices, and planning for success.  Tonya Nicholson has agreed to follow-up with our clinic in 3 weeks. She was informed of the importance of frequent follow-up visits to maximize her success with intensive lifestyle modifications for her multiple health conditions.   Objective:   Blood pressure 123/79, pulse (!) 54, temperature 97.6 F (36.4 C), height '5\' 7"'$  (1.702 m), weight 229 lb (103.9 kg), last menstrual period 04/09/2016, SpO2 100 %. Body mass index is 35.87 kg/m.  General: Cooperative, alert, well developed, in no acute distress. HEENT: Conjunctivae and lids unremarkable. Cardiovascular: Regular rhythm.  Lungs: Normal work of breathing. Neurologic: No focal deficits.   Lab Results  Component Value Date   CREATININE 0.86 02/05/2022   BUN 16 02/05/2022   NA 141 02/05/2022   K 4.8 02/05/2022   CL 103 02/05/2022   CO2 23 02/05/2022   Lab Results  Component  Value Date   ALT 13 02/05/2022   AST 18 02/05/2022   ALKPHOS 97 02/05/2022   BILITOT 0.5 02/05/2022   Lab Results  Component Value Date   HGBA1C 5.1 02/05/2022   HGBA1C 5.5 07/15/2021   HGBA1C 5.2 06/18/2021   HGBA1C 5.3 04/10/2021   HGBA1C 6.0 (H) 09/26/2020   Lab Results  Component Value  Date   INSULIN 4.9 02/05/2022   INSULIN 12.8 04/10/2021   INSULIN 11.0 09/26/2020   INSULIN 9.4 04/25/2020   INSULIN 14.5 06/29/2019   Lab Results  Component Value Date   TSH 1.410 02/24/2018   Lab Results  Component Value Date   CHOL 166 04/25/2020   HDL 54 04/25/2020   LDLCALC 98 04/25/2020   TRIG 71 04/25/2020   CHOLHDL 3.1 04/25/2020   Lab Results  Component Value Date   VD25OH 48.6 02/05/2022   VD25OH 77.7 07/15/2021   VD25OH 58.6 04/10/2021   Lab Results  Component Value Date   WBC 5.6 07/11/2021   HGB 13.8 07/11/2021   HCT 41.0 07/11/2021   MCV 90 07/11/2021   PLT 284 07/11/2021   No results found for: "IRON", "TIBC", "FERRITIN"  Attestation Statements:   Reviewed by clinician on day of visit: allergies, medications, problem list, medical history, surgical history, family history, social history, and previous encounter notes.  I have reviewed the above documentation for accuracy and completeness, and I agree with the above. -  Cristie Mckinney d. Sherilynn Dieu, NP-C

## 2022-03-27 ENCOUNTER — Encounter (INDEPENDENT_AMBULATORY_CARE_PROVIDER_SITE_OTHER): Payer: Self-pay | Admitting: Adult Health

## 2022-03-27 ENCOUNTER — Other Ambulatory Visit (HOSPITAL_COMMUNITY): Payer: Self-pay

## 2022-03-30 ENCOUNTER — Other Ambulatory Visit (HOSPITAL_COMMUNITY): Payer: Self-pay

## 2022-03-31 ENCOUNTER — Other Ambulatory Visit: Payer: Self-pay

## 2022-04-13 DIAGNOSIS — G4733 Obstructive sleep apnea (adult) (pediatric): Secondary | ICD-10-CM | POA: Diagnosis not present

## 2022-04-16 ENCOUNTER — Encounter (INDEPENDENT_AMBULATORY_CARE_PROVIDER_SITE_OTHER): Payer: Self-pay | Admitting: Adult Health

## 2022-04-16 ENCOUNTER — Ambulatory Visit (INDEPENDENT_AMBULATORY_CARE_PROVIDER_SITE_OTHER): Payer: 59 | Admitting: Adult Health

## 2022-04-16 ENCOUNTER — Other Ambulatory Visit (HOSPITAL_COMMUNITY): Payer: Self-pay

## 2022-04-16 VITALS — BP 94/62 | HR 66 | Temp 98.2°F | Ht 67.0 in | Wt 230.0 lb

## 2022-04-16 DIAGNOSIS — E669 Obesity, unspecified: Secondary | ICD-10-CM | POA: Diagnosis not present

## 2022-04-16 DIAGNOSIS — R7303 Prediabetes: Secondary | ICD-10-CM

## 2022-04-16 DIAGNOSIS — Z6835 Body mass index (BMI) 35.0-35.9, adult: Secondary | ICD-10-CM | POA: Diagnosis not present

## 2022-04-16 NOTE — Progress Notes (Signed)
WEIGHT SUMMARY AND BIOMETRICS  Vitals Temp: 98.2 F (36.8 C) BP: 94/62 Pulse Rate: 66 SpO2: 97 %   Anthropometric Measurements Height: 5\' 7"  (1.702 m) Weight: 230 lb (104.3 kg) BMI (Calculated): 36.01 Weight at Last Visit: 229lb Weight Lost Since Last Visit: 0 Weight Gained Since Last Visit: 1lb Starting Weight: 263lb Total Weight Loss (lbs): 33 lb (15 kg)   Body Composition  Body Fat %: 47.9 % Fat Mass (lbs): 110.2 lbs Muscle Mass (lbs): 114 lbs Total Body Water (lbs): 86.4 lbs Visceral Fat Rating : 14   Other Clinical Data Fasting: Yes Labs: No Today's Visit #: 63 Starting Date: 02/24/18    Chief Complaint:   OBESITY Tonya Nicholson is here to discuss her progress with her obesity treatment plan. She is on the the Category 1 Plan and states she is following her eating plan approximately 50 % of the time.  She states she is exercising treadmill 30 minutes 2 times per week.   Interim History:  She has been travelling back and forth to [place] to visit her 28 year old mother. She will eat as best she can when she travels.  She has also been keeping her "granddog".  [place] will not continue to cover [facility] therapy. She is currently on Wegovy 1mg  once every 14 days- Denies mass in neck, dysphagia, dyspepsia, persistent hoarseness, abdominal pain, or N/V/C  She has 6 pens remaining at home.  Subjective:   1. Prediabetes Lab Results  Component Value Date   HGBA1C 5.1 02/05/2022   HGBA1C 5.5 07/15/2021   HGBA1C 5.2 06/18/2021   [facility] will not continue to cover [facility] therapy. She is currently on Wegovy 1mg  once every 14 days- Denies mass in neck, dysphagia, dyspepsia, persistent hoarseness, abdominal pain, or N/V/C  She has 6 pens remaining at home.  Assessment/Plan:   1. Prediabetes Continue Cat 1 meal plan and regular exercise. Consider Metformin therapy in future once GLP-1 supply has been exhausted.  2. Obesity,  current BMI 35.86  Indiyah is currently in the action stage of change. As such, her goal is to continue with weight loss efforts. She has agreed to the Category 1 Plan.   Handout: Eating Out Guide  Exercise goals: For substantial health benefits, adults should do at least 150 minutes (2 hours and 30 minutes) a week of moderate-intensity, or 75 minutes (1 hour and 15 minutes) a week of vigorous-intensity aerobic physical activity, or an equivalent combination of moderate- and vigorous-intensity aerobic activity. Aerobic activity should be performed in episodes of at least 10 minutes, and preferably, it should be spread throughout the week.  Behavioral modification strategies: increasing lean protein intake, decreasing simple carbohydrates, increasing vegetables, increasing water intake, and planning for success.  Luta has agreed to follow-up with our clinic in 4 weeks. She was informed of the importance of frequent follow-up visits to maximize her success with intensive lifestyle modifications for her multiple health conditions.   Objective:   Blood pressure 94/62, pulse 66, temperature 98.2 F (36.8 C), height 5\' 7"  (1.702 m), weight 230 lb (104.3 kg), last menstrual period 04/09/2016, SpO2 97 %. Body mass index is 36.02 kg/m.  General: Cooperative, alert, well developed, in no acute distress. HEENT: Conjunctivae and lids unremarkable. Cardiovascular: Regular rhythm.  Lungs: Normal work of breathing. Neurologic: No focal deficits.   Lab Results  Component Value Date   CREATININE 0.86 02/05/2022   BUN 16 02/05/2022   NA 141 02/05/2022  K 4.8 02/05/2022   CL 103 02/05/2022   CO2 23 02/05/2022   Lab Results  Component Value Date   ALT 13 02/05/2022   AST 18 02/05/2022   ALKPHOS 97 02/05/2022   BILITOT 0.5 02/05/2022   Lab Results  Component Value Date   HGBA1C 5.1 02/05/2022   HGBA1C 5.5 07/15/2021   HGBA1C 5.2 06/18/2021   HGBA1C 5.3 04/10/2021   HGBA1C 6.0 (H) 09/26/2020    Lab Results  Component Value Date   INSULIN 4.9 02/05/2022   INSULIN 12.8 04/10/2021   INSULIN 11.0 09/26/2020   INSULIN 9.4 04/25/2020   INSULIN 14.5 06/29/2019   Lab Results  Component Value Date   TSH 1.410 02/24/2018   Lab Results  Component Value Date   CHOL 166 04/25/2020   HDL 54 04/25/2020   LDLCALC 98 04/25/2020   TRIG 71 04/25/2020   CHOLHDL 3.1 04/25/2020   Lab Results  Component Value Date   VD25OH 48.6 02/05/2022   VD25OH 77.7 07/15/2021   VD25OH 58.6 04/10/2021   Lab Results  Component Value Date   WBC 5.6 07/11/2021   HGB 13.8 07/11/2021   HCT 41.0 07/11/2021   MCV 90 07/11/2021   PLT 284 07/11/2021   No results found for: "IRON", "TIBC", "FERRITIN"  Attestation Statements:   Reviewed by clinician on day of visit: allergies, medications, problem list, medical history, surgical history, family history, social history, and previous encounter notes.  Time spent on visit including pre-visit chart review and post-visit care and charting was 26 minutes.   I have reviewed the above documentation for accuracy and completeness, and I agree with the above. -  Frankee Gritz d. Kanyah Matsushima, NP-C

## 2022-04-20 ENCOUNTER — Other Ambulatory Visit: Payer: Self-pay

## 2022-04-20 ENCOUNTER — Encounter (INDEPENDENT_AMBULATORY_CARE_PROVIDER_SITE_OTHER): Payer: Self-pay | Admitting: Adult Health

## 2022-04-20 ENCOUNTER — Other Ambulatory Visit (HOSPITAL_COMMUNITY): Payer: Self-pay

## 2022-04-20 ENCOUNTER — Other Ambulatory Visit (INDEPENDENT_AMBULATORY_CARE_PROVIDER_SITE_OTHER): Payer: Self-pay | Admitting: Adult Health

## 2022-04-20 MED ORDER — WEGOVY 1.7 MG/0.75ML ~~LOC~~ SOAJ
1.7000 mg | SUBCUTANEOUS | 0 refills | Status: DC
  Filled 2022-04-20: qty 3, 28d supply, fill #0

## 2022-04-21 ENCOUNTER — Other Ambulatory Visit (HOSPITAL_COMMUNITY): Payer: Self-pay

## 2022-04-22 ENCOUNTER — Other Ambulatory Visit (HOSPITAL_COMMUNITY): Payer: Self-pay

## 2022-05-07 ENCOUNTER — Ambulatory Visit (INDEPENDENT_AMBULATORY_CARE_PROVIDER_SITE_OTHER): Payer: 59 | Admitting: Adult Health

## 2022-05-07 ENCOUNTER — Other Ambulatory Visit (HOSPITAL_COMMUNITY): Payer: Self-pay

## 2022-05-07 ENCOUNTER — Encounter (INDEPENDENT_AMBULATORY_CARE_PROVIDER_SITE_OTHER): Payer: Self-pay | Admitting: Adult Health

## 2022-05-07 VITALS — BP 115/76 | HR 57 | Temp 97.9°F | Ht 67.0 in | Wt 233.0 lb

## 2022-05-07 DIAGNOSIS — R7303 Prediabetes: Secondary | ICD-10-CM

## 2022-05-07 DIAGNOSIS — E669 Obesity, unspecified: Secondary | ICD-10-CM | POA: Diagnosis not present

## 2022-05-07 DIAGNOSIS — Z6836 Body mass index (BMI) 36.0-36.9, adult: Secondary | ICD-10-CM | POA: Diagnosis not present

## 2022-05-07 MED ORDER — WEGOVY 1.7 MG/0.75ML ~~LOC~~ SOAJ
1.7000 mg | SUBCUTANEOUS | 0 refills | Status: DC
  Filled 2022-05-07 – 2022-05-20 (×3): qty 3, 28d supply, fill #0

## 2022-05-07 NOTE — Progress Notes (Signed)
WEIGHT SUMMARY AND BIOMETRICS  Vitals Temp: 97.9 F (36.6 C) BP: 115/76 Pulse Rate: (!) 57 SpO2: 98 %   Anthropometric Measurements Height: 5\' 7"  (1.702 m) Weight: 233 lb (105.7 kg) BMI (Calculated): 36.48 Weight at Last Visit: 230lb Weight Lost Since Last Visit: 0 Weight Gained Since Last Visit: 3lb Starting Weight: 263lb Total Weight Loss (lbs): 30 lb (13.6 kg)   Body Composition  Body Fat %: 48.9 % Fat Mass (lbs): 114.4 lbs Muscle Mass (lbs): 113.4 lbs Total Body Water (lbs): 91 lbs Visceral Fat Rating : 15   Other Clinical Data Fasting: No Labs: No Today's Visit #: 25 Starting Date: 02/24/18    Chief Complaint:   OBESITY Tonya Nicholson is here to discuss her progress with her obesity treatment plan. She is on the the Category 1 Plan and states she is following her eating plan approximately 70 % of the time.  She states she is exercising walking 30 minutes 2 times per week.   Interim History:  She was able to obtain another one month supply of Wegovy 1.7mg  on 04/19/2022.  She denies SE from GLP-1 therapy and hopes to obtain one more refill before coverage ceases 05/21/2022.  She has been walking 30 mins 2 x week  Of Note-  Prior authorization approved for [facility]. Effective: 08/09/2021 to 08/09/2022. Patient sent approval message via mychart.   Subjective:   1. Prediabetes  Latest Reference Range & Units 02/05/22 11:12  Glucose 70 - 99 mg/dL 85  Hemoglobin G3T 4.8 - 5.6 % 5.1  Est. average glucose Bld gHb Est-mCnc mg/dL 517  INSULIN 2.6 - 61.6 uIU/mL 4.9   Lab Results  Component Value Date   HGBA1C 5.1 02/05/2022   HGBA1C 5.5 07/15/2021   HGBA1C 5.2 06/18/2021   She denies polyphagia and has been able to remain on Wegovy 1.7mg  with a sudden extension of GLP-1 coverage for final 4 weeks. Denies mass in neck, dysphagia, dyspepsia, persistent hoarseness, abdominal pain, or N/V/C  She estimates 70% compliance on Cat 2 Meal Plan.   Assessment/Plan:    1. Prediabetes Continue healthy eating and increase regular walking.  2. Current BMI 36.48  Tonya Nicholson is currently in the action stage of change. As such, her goal is to continue with weight loss efforts. She has agreed to the Category 2 Plan.   Exercise goals: All adults should avoid inactivity. Some physical activity is better than none, and adults who participate in any amount of physical activity gain some health benefits. For additional and more extensive health benefits, adults should increase their aerobic physical activity to 300 minutes (5 hours) a week of moderate-intensity, or 150 minutes a week of vigorous-intensity aerobic physical activity, or an equivalent combination of moderate- and vigorous-intensity activity. Additional health benefits are gained by engaging in physical activity beyond this amount.  Adults should also include muscle-strengthening activities that involve all major muscle groups on 2 or more days a week.  Behavioral modification strategies: increasing lean protein intake, decreasing simple carbohydrates, increasing vegetables, increasing water intake, no skipping meals, meal planning and cooking strategies, and planning for success.  Tonya Nicholson has agreed to follow-up with our clinic in 4 weeks. She was informed of the importance of frequent follow-up visits to maximize her success with intensive lifestyle modifications for her multiple health conditions.   Objective:   Blood pressure 115/76, pulse (!) 57, temperature 97.9 F (36.6 C), height 5\' 7"  (1.702 m), weight 233 lb (105.7 kg), last menstrual period 04/09/2016, SpO2  98 %. Body mass index is 36.49 kg/m.  General: Cooperative, alert, well developed, in no acute distress. HEENT: Conjunctivae and lids unremarkable. Cardiovascular: Regular rhythm.  Lungs: Normal work of breathing. Neurologic: No focal deficits.   Lab Results  Component Value Date   CREATININE 0.86 02/05/2022   BUN 16 02/05/2022   NA 141  02/05/2022   K 4.8 02/05/2022   CL 103 02/05/2022   CO2 23 02/05/2022   Lab Results  Component Value Date   ALT 13 02/05/2022   AST 18 02/05/2022   ALKPHOS 97 02/05/2022   BILITOT 0.5 02/05/2022   Lab Results  Component Value Date   HGBA1C 5.1 02/05/2022   HGBA1C 5.5 07/15/2021   HGBA1C 5.2 06/18/2021   HGBA1C 5.3 04/10/2021   HGBA1C 6.0 (H) 09/26/2020   Lab Results  Component Value Date   INSULIN 4.9 02/05/2022   INSULIN 12.8 04/10/2021   INSULIN 11.0 09/26/2020   INSULIN 9.4 04/25/2020   INSULIN 14.5 06/29/2019   Lab Results  Component Value Date   TSH 1.410 02/24/2018   Lab Results  Component Value Date   CHOL 166 04/25/2020   HDL 54 04/25/2020   LDLCALC 98 04/25/2020   TRIG 71 04/25/2020   CHOLHDL 3.1 04/25/2020   Lab Results  Component Value Date   VD25OH 48.6 02/05/2022   VD25OH 77.7 07/15/2021   VD25OH 58.6 04/10/2021   Lab Results  Component Value Date   WBC 5.6 07/11/2021   HGB 13.8 07/11/2021   HCT 41.0 07/11/2021   MCV 90 07/11/2021   PLT 284 07/11/2021   No results found for: "IRON", "TIBC", "FERRITIN"  Attestation Statements:   Reviewed by clinician on day of visit: allergies, medications, problem list, medical history, surgical history, family history, social history, and previous encounter notes.  I have reviewed the above documentation for accuracy and completeness, and I agree with the above. -  Kallon Caylor d. Bairon Klemann, NP-C

## 2022-05-14 DIAGNOSIS — G4733 Obstructive sleep apnea (adult) (pediatric): Secondary | ICD-10-CM | POA: Diagnosis not present

## 2022-05-20 ENCOUNTER — Other Ambulatory Visit (HOSPITAL_COMMUNITY): Payer: Self-pay

## 2022-05-21 ENCOUNTER — Other Ambulatory Visit (HOSPITAL_COMMUNITY): Payer: Self-pay

## 2022-05-22 ENCOUNTER — Other Ambulatory Visit (HOSPITAL_COMMUNITY): Payer: Self-pay

## 2022-06-04 ENCOUNTER — Ambulatory Visit (INDEPENDENT_AMBULATORY_CARE_PROVIDER_SITE_OTHER): Payer: 59 | Admitting: Adult Health

## 2022-06-25 ENCOUNTER — Ambulatory Visit (INDEPENDENT_AMBULATORY_CARE_PROVIDER_SITE_OTHER): Payer: 59 | Admitting: Adult Health

## 2022-06-25 ENCOUNTER — Encounter (INDEPENDENT_AMBULATORY_CARE_PROVIDER_SITE_OTHER): Payer: Self-pay | Admitting: Adult Health

## 2022-06-25 ENCOUNTER — Other Ambulatory Visit (HOSPITAL_COMMUNITY): Payer: Self-pay

## 2022-06-25 VITALS — BP 104/70 | HR 56 | Temp 97.6°F | Ht 67.0 in | Wt 231.0 lb

## 2022-06-25 DIAGNOSIS — R7303 Prediabetes: Secondary | ICD-10-CM | POA: Diagnosis not present

## 2022-06-25 DIAGNOSIS — F439 Reaction to severe stress, unspecified: Secondary | ICD-10-CM | POA: Diagnosis not present

## 2022-06-25 DIAGNOSIS — E669 Obesity, unspecified: Secondary | ICD-10-CM

## 2022-06-25 DIAGNOSIS — E559 Vitamin D deficiency, unspecified: Secondary | ICD-10-CM

## 2022-06-25 DIAGNOSIS — Z6836 Body mass index (BMI) 36.0-36.9, adult: Secondary | ICD-10-CM | POA: Diagnosis not present

## 2022-06-25 MED ORDER — WEGOVY 1.7 MG/0.75ML ~~LOC~~ SOAJ
1.7000 mg | SUBCUTANEOUS | 0 refills | Status: DC
  Filled 2022-06-25: qty 3, 28d supply, fill #0

## 2022-06-25 NOTE — Progress Notes (Signed)
WEIGHT SUMMARY AND BIOMETRICS  Vitals Temp: 97.6 F (36.4 C) BP: 104/70 Pulse Rate: (!) 56 SpO2: 98 %   Anthropometric Measurements Height: 5\' 7"  (1.702 m) Weight: 231 lb (104.8 kg) BMI (Calculated): 36.17 Weight at Last Visit: 233lb Weight Lost Since Last Visit: 2lb Weight Gained Since Last Visit: 0 Starting Weight: 263lb Total Weight Loss (lbs): 32 lb (14.5 kg)   Body Composition  Body Fat %: 47.6 % Fat Mass (lbs): 110.2 lbs Muscle Mass (lbs): 115 lbs Total Body Water (lbs): 88 lbs Visceral Fat Rating : 14   Other Clinical Data Fasting: no Labs: no Today's Visit #: 54 Starting Date: 02/24/18    Chief Complaint:   OBESITY Tonya Nicholson is here to discuss her progress with her obesity treatment plan. She is on the the Category 1 Plan and states she is following her eating plan approximately 50 % of the time. She states she is exercising daily walking minutes.   Interim History:  Since last OV at [facility] on 05/07/2022  Her sister- "Tonya Nicholson" age 71 Hx of COPD Suffered Respiratory Failure- required 3 days of Ventilator support She is now home stable, not on portable O2  Tonya Nicholson- has been traveling to [place] where [place] lives and to [place] where her mother lives during the last 3 weeks.  She reports low appetite with the stress r/t to her sister health crisis  Reviewed Bioimpedance Results with pt: Muscle Mass: +1.6 lbs Adipose Mass: -4.2 lbs  Subjective:   1. Vitamin D deficiency  Latest Reference Range & Units 07/15/21 16:37 02/05/22 11:12  Vitamin D, 25-Hydroxy 30.0 - 100.0 ng/mL 77.7 48.6  She reports stable energy levels  2. Stress at home Since last OV at [facility] on 05/07/2022  Her sister- "Tonya Nicholson" age 77 Hx of COPD Suffered Respiratory Failure- required 3 days of Ventilator support She is now home stable, not on portable O2  Tonya Nicholson- has been traveling to [place] where [place] lives and to [place] where her mother lives during the last 3  weeks.  She reports low appetite with the stress r/t to her sister health crisis  3. Prediabetes Lab Results  Component Value Date   HGBA1C 5.1 02/05/2022   HGBA1C 5.5 07/15/2021   HGBA1C 5.2 06/18/2021    She is currently on Wegovy 1.7mg  Q14 days Denies mass in neck, dysphagia, dyspepsia, persistent hoarseness, abdominal pain, or N/V/C  Assessment/Plan:   1. Vitamin D deficiency Check Labs at next OV  2. Stress at home Continue regular walking and surround herself supportive family/friends  3. Prediabetes Continue injectable GLP-1 therapy  4. Current BMI 36.17 Refill Semaglutide-Weight Management (WEGOVY) 1.7 MG/0.75ML SOAJ Inject 1.7 mg into the skin once a week. Dispense: 9 mL, Refills: 0 of 0 remaining   Tonya Nicholson is currently in the action stage of change. As such, her goal is to continue with weight loss efforts. She has agreed to the Category 1 Plan.   Exercise goals: For substantial health benefits, adults should do at least 150 minutes (2 hours and 30 minutes) a week of moderate-intensity, or 75 minutes (1 hour and 15 minutes) a week of vigorous-intensity aerobic physical activity, or an equivalent combination of moderate- and vigorous-intensity aerobic activity. Aerobic activity should be performed in episodes of at least 10 minutes, and preferably, it should be spread throughout the week.  Behavioral modification strategies: increasing lean protein intake, decreasing simple carbohydrates, increasing vegetables, increasing water intake, no skipping meals, meal planning and cooking  strategies, and planning for success.  Tonya Nicholson has agreed to follow-up with our clinic in 4 weeks. She was informed of the importance of frequent follow-up visits to maximize her success with intensive lifestyle modifications for her multiple health conditions.   Check Fasting Labs at Next OV  Objective:   Blood pressure 104/70, pulse (!) 56, temperature 97.6 F (36.4 C), height 5\' 7"  (1.702  m), weight 231 lb (104.8 kg), last menstrual period 04/09/2016, SpO2 98 %. Body mass index is 36.18 kg/m.  General: Cooperative, alert, well developed, in no acute distress. HEENT: Conjunctivae and lids unremarkable. Cardiovascular: Regular rhythm.  Lungs: Normal work of breathing. Neurologic: No focal deficits.   Lab Results  Component Value Date   CREATININE 0.86 02/05/2022   BUN 16 02/05/2022   NA 141 02/05/2022   K 4.8 02/05/2022   CL 103 02/05/2022   CO2 23 02/05/2022   Lab Results  Component Value Date   ALT 13 02/05/2022   AST 18 02/05/2022   ALKPHOS 97 02/05/2022   BILITOT 0.5 02/05/2022   Lab Results  Component Value Date   HGBA1C 5.1 02/05/2022   HGBA1C 5.5 07/15/2021   HGBA1C 5.2 06/18/2021   HGBA1C 5.3 04/10/2021   HGBA1C 6.0 (H) 09/26/2020   Lab Results  Component Value Date   INSULIN 4.9 02/05/2022   INSULIN 12.8 04/10/2021   INSULIN 11.0 09/26/2020   INSULIN 9.4 04/25/2020   INSULIN 14.5 06/29/2019   Lab Results  Component Value Date   TSH 1.410 02/24/2018   Lab Results  Component Value Date   CHOL 166 04/25/2020   HDL 54 04/25/2020   LDLCALC 98 04/25/2020   TRIG 71 04/25/2020   CHOLHDL 3.1 04/25/2020   Lab Results  Component Value Date   VD25OH 48.6 02/05/2022   VD25OH 77.7 07/15/2021   VD25OH 58.6 04/10/2021   Lab Results  Component Value Date   WBC 5.6 07/11/2021   HGB 13.8 07/11/2021   HCT 41.0 07/11/2021   MCV 90 07/11/2021   PLT 284 07/11/2021   No results found for: "IRON", "TIBC", "FERRITIN"  Attestation Statements:   Reviewed by clinician on day of visit: allergies, medications, problem list, medical history, surgical history, family history, social history, and previous encounter notes.  I have reviewed the above documentation for accuracy and completeness, and I agree with the above. -  Tonya Nicholson d. Tonya Rys, NP-C

## 2022-07-08 ENCOUNTER — Other Ambulatory Visit (HOSPITAL_COMMUNITY): Payer: Self-pay

## 2022-07-15 ENCOUNTER — Other Ambulatory Visit (HOSPITAL_COMMUNITY): Payer: Self-pay

## 2022-07-15 ENCOUNTER — Other Ambulatory Visit: Payer: Self-pay | Admitting: Cardiology

## 2022-07-15 DIAGNOSIS — I4819 Other persistent atrial fibrillation: Secondary | ICD-10-CM

## 2022-07-15 MED ORDER — APIXABAN 5 MG PO TABS
5.0000 mg | ORAL_TABLET | Freq: Two times a day (BID) | ORAL | 1 refills | Status: DC
  Filled 2022-07-15: qty 180, 90d supply, fill #0
  Filled 2022-10-16 (×2): qty 180, 90d supply, fill #1

## 2022-07-15 NOTE — Telephone Encounter (Signed)
Prescription refill request for Eliquis received. Indication: a fib Last office visit: 11/20/21 Scr: 0.86 02/05/22 epic Age: 62 Weight: 104kg

## 2022-07-24 ENCOUNTER — Other Ambulatory Visit (HOSPITAL_COMMUNITY): Payer: Self-pay

## 2022-08-06 ENCOUNTER — Ambulatory Visit (INDEPENDENT_AMBULATORY_CARE_PROVIDER_SITE_OTHER): Payer: 59 | Admitting: Adult Health

## 2022-08-13 ENCOUNTER — Ambulatory Visit (INDEPENDENT_AMBULATORY_CARE_PROVIDER_SITE_OTHER): Payer: 59 | Admitting: Adult Health

## 2022-08-25 ENCOUNTER — Other Ambulatory Visit (HOSPITAL_COMMUNITY): Payer: Self-pay

## 2022-08-25 ENCOUNTER — Other Ambulatory Visit: Payer: Self-pay | Admitting: Physician Assistant

## 2022-08-25 MED ORDER — FLECAINIDE ACETATE 100 MG PO TABS
100.0000 mg | ORAL_TABLET | Freq: Two times a day (BID) | ORAL | 0 refills | Status: DC
  Filled 2022-08-25: qty 180, 90d supply, fill #0

## 2022-08-27 ENCOUNTER — Other Ambulatory Visit (HOSPITAL_COMMUNITY): Payer: Self-pay

## 2022-08-27 MED ORDER — CHLORHEXIDINE GLUCONATE 0.12 % MT SOLN
15.0000 mL | OROMUCOSAL | 0 refills | Status: DC
  Filled 2022-08-27: qty 473, 15d supply, fill #0

## 2022-08-27 MED ORDER — HYDROCODONE-ACETAMINOPHEN 5-325 MG PO TABS
1.0000 | ORAL_TABLET | Freq: Four times a day (QID) | ORAL | 0 refills | Status: DC | PRN
  Filled 2022-08-27: qty 12, 3d supply, fill #0

## 2022-08-28 ENCOUNTER — Other Ambulatory Visit (HOSPITAL_COMMUNITY): Payer: Self-pay

## 2022-09-17 ENCOUNTER — Ambulatory Visit (INDEPENDENT_AMBULATORY_CARE_PROVIDER_SITE_OTHER): Payer: 59 | Admitting: Adult Health

## 2022-09-24 DIAGNOSIS — Z01419 Encounter for gynecological examination (general) (routine) without abnormal findings: Secondary | ICD-10-CM | POA: Diagnosis not present

## 2022-09-24 DIAGNOSIS — Z23 Encounter for immunization: Secondary | ICD-10-CM | POA: Diagnosis not present

## 2022-09-24 DIAGNOSIS — Z6839 Body mass index (BMI) 39.0-39.9, adult: Secondary | ICD-10-CM | POA: Diagnosis not present

## 2022-09-24 DIAGNOSIS — Z1231 Encounter for screening mammogram for malignant neoplasm of breast: Secondary | ICD-10-CM | POA: Diagnosis not present

## 2022-10-15 ENCOUNTER — Ambulatory Visit (INDEPENDENT_AMBULATORY_CARE_PROVIDER_SITE_OTHER): Payer: 59 | Admitting: Adult Health

## 2022-10-16 ENCOUNTER — Other Ambulatory Visit (HOSPITAL_COMMUNITY): Payer: Self-pay

## 2022-10-16 ENCOUNTER — Encounter (HOSPITAL_COMMUNITY): Payer: Self-pay

## 2022-11-05 ENCOUNTER — Ambulatory Visit (INDEPENDENT_AMBULATORY_CARE_PROVIDER_SITE_OTHER): Payer: 59 | Admitting: Adult Health

## 2022-11-18 ENCOUNTER — Other Ambulatory Visit (INDEPENDENT_AMBULATORY_CARE_PROVIDER_SITE_OTHER): Payer: Self-pay | Admitting: Physician Assistant

## 2022-11-18 ENCOUNTER — Other Ambulatory Visit: Payer: Self-pay | Admitting: Cardiology

## 2022-11-18 ENCOUNTER — Other Ambulatory Visit (HOSPITAL_COMMUNITY): Payer: Self-pay

## 2022-11-18 DIAGNOSIS — I10 Essential (primary) hypertension: Secondary | ICD-10-CM

## 2022-11-18 MED ORDER — METOPROLOL TARTRATE 25 MG PO TABS
25.0000 mg | ORAL_TABLET | Freq: Two times a day (BID) | ORAL | 0 refills | Status: DC
  Filled 2022-11-18: qty 60, 30d supply, fill #0

## 2022-11-18 MED ORDER — FLECAINIDE ACETATE 100 MG PO TABS
100.0000 mg | ORAL_TABLET | Freq: Two times a day (BID) | ORAL | 0 refills | Status: DC
  Filled 2022-11-18: qty 60, 30d supply, fill #0

## 2022-11-23 ENCOUNTER — Other Ambulatory Visit (HOSPITAL_COMMUNITY): Payer: Self-pay

## 2022-11-29 IMAGING — US US ABDOMEN LIMITED
1 series · 14 of 25 positions shown · non-contrast
Comparison: None Available.

CLINICAL DATA: [id number].  Right upper quadrant abdominal pain, nausea

EXAM:
ULTRASOUND ABDOMEN LIMITED RIGHT UPPER QUADRANT

[Series 1: us abdomen limited ruq (liver/gb) · 14 of 44 slices shown]
[im 1/44]
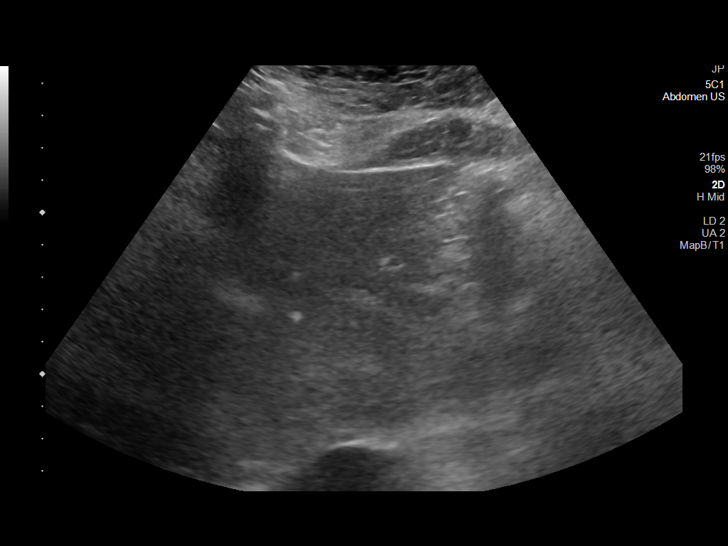
[im 4/44]
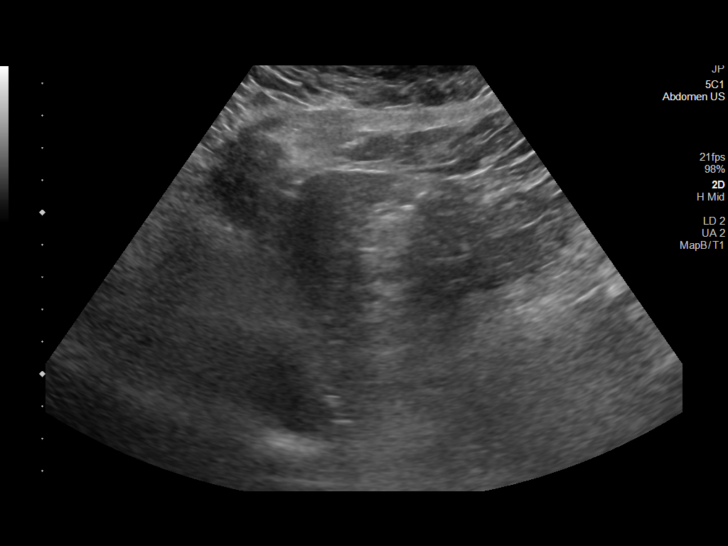
[im 8/44]
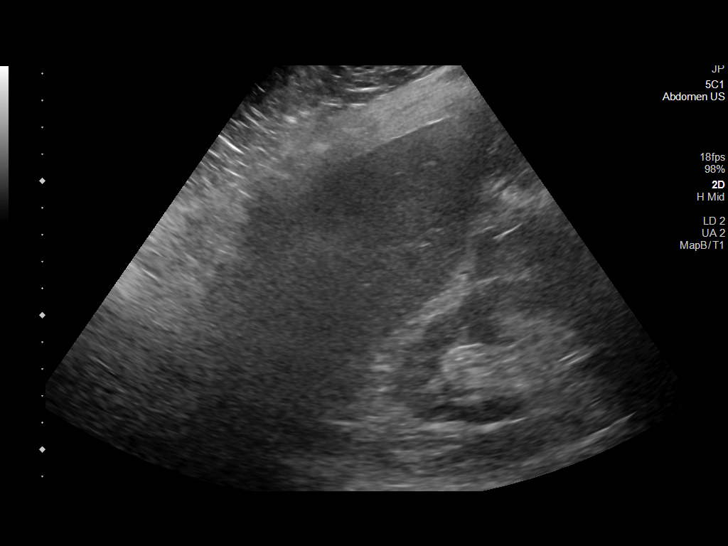
[im 11/44]
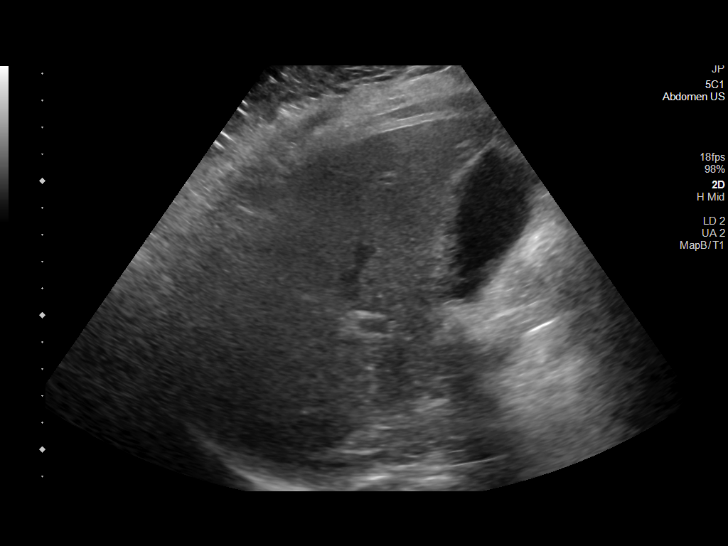
[im 15/44]
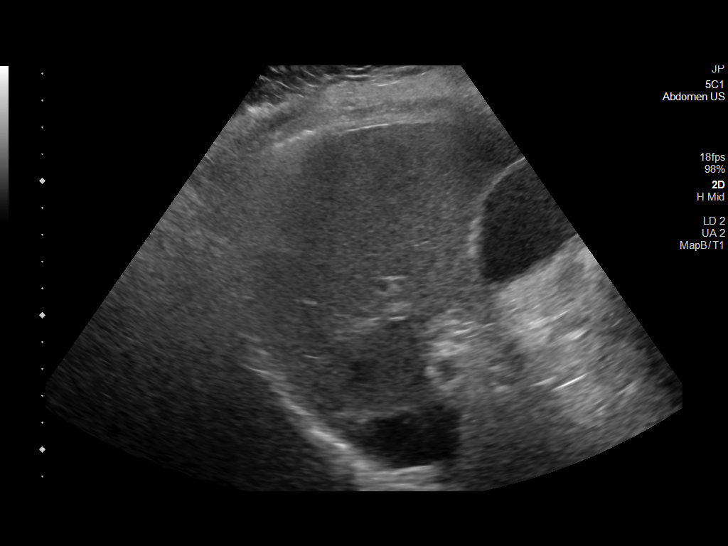
[im 17/44]
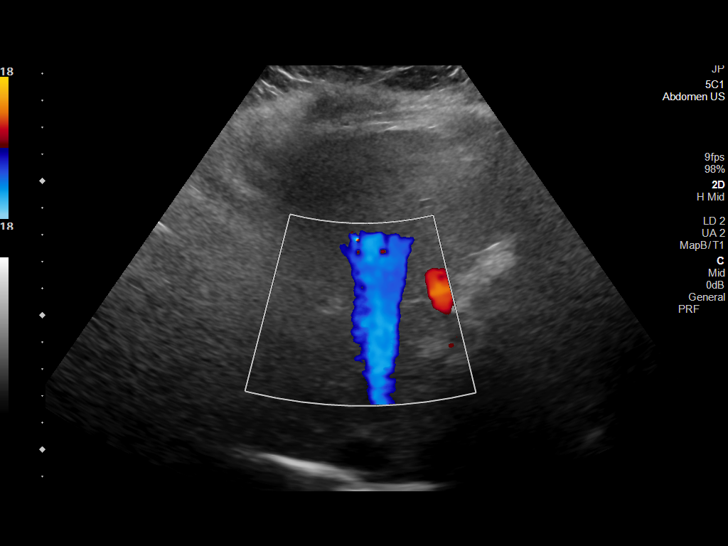
[im 20/44]
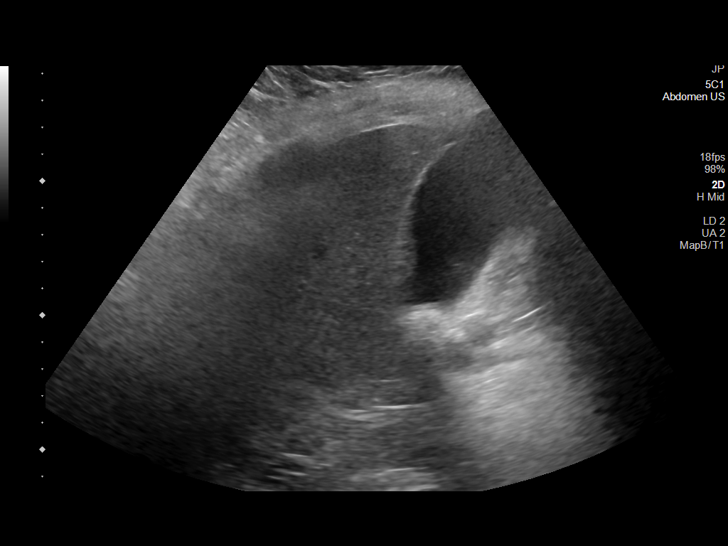
[im 24/44]
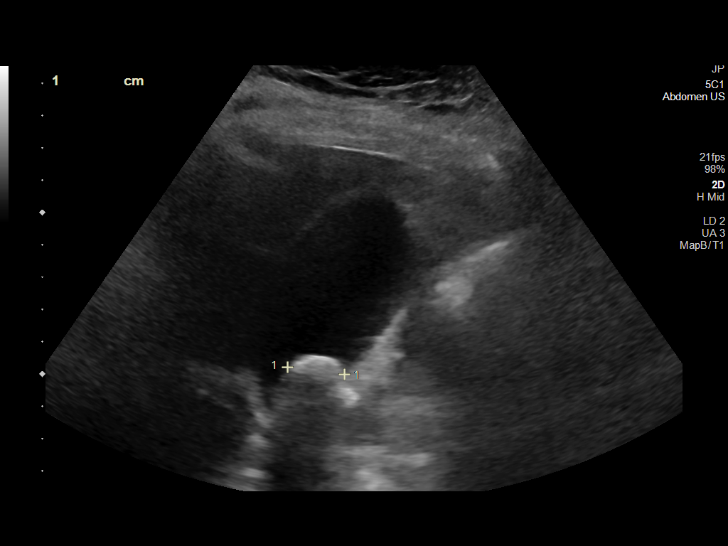
[im 27/44]
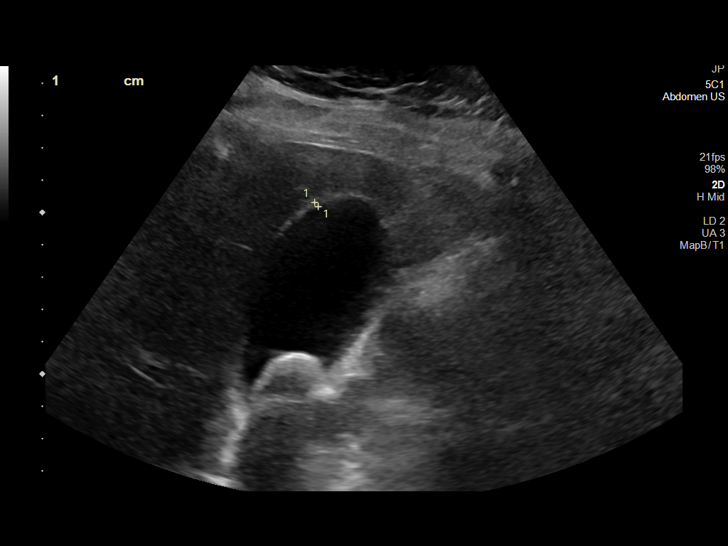
[im 29/44]
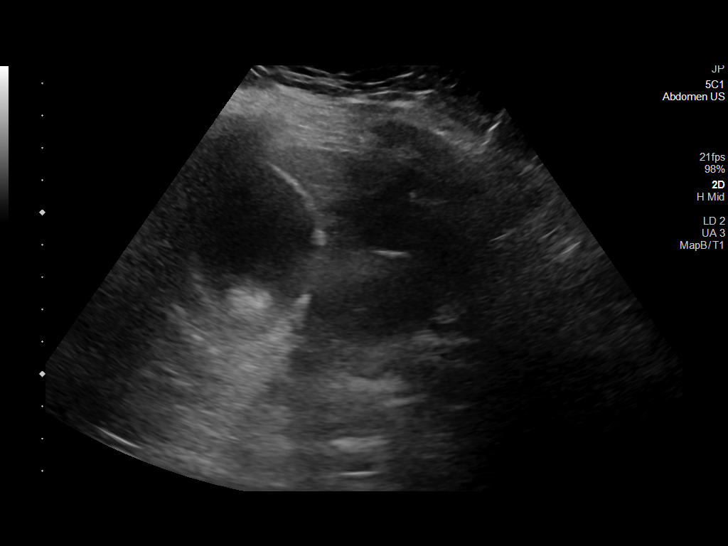
[im 33/44]
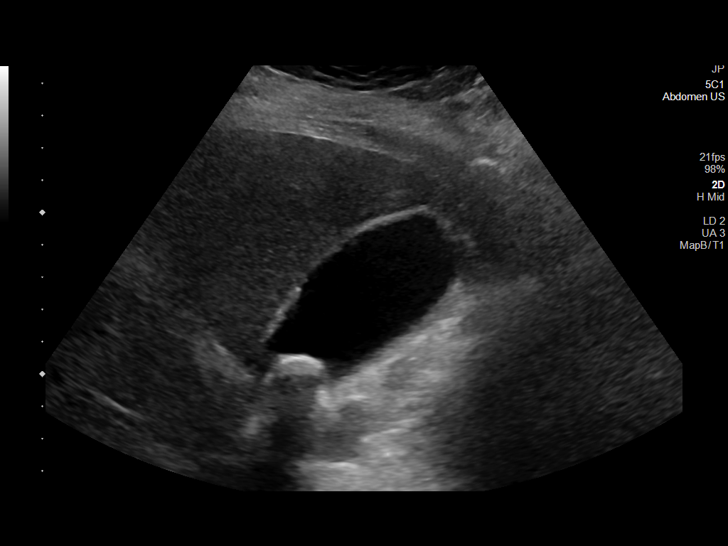
[im 36/44]
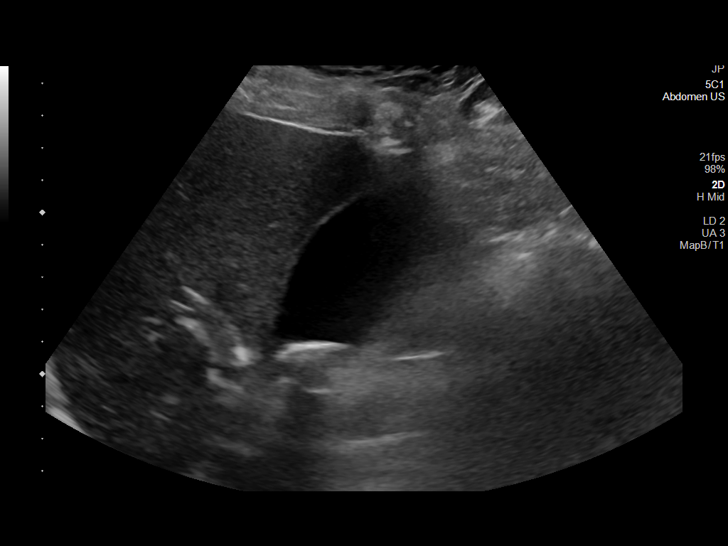
[im 40/44]
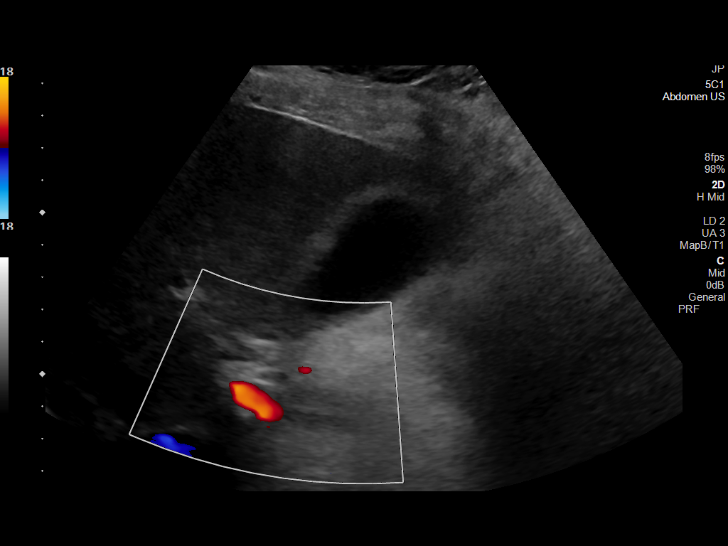
[im 44/44]
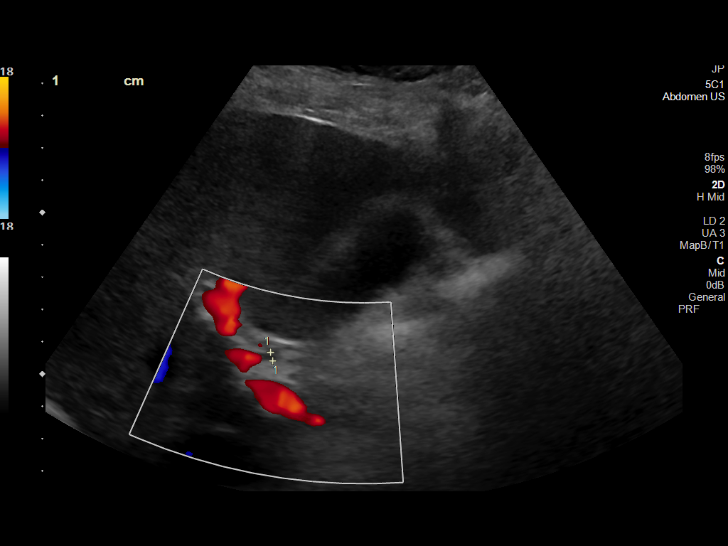

[14 of 25 positions shown; findings below may reference images not displayed]

FINDINGS: Gallbladder:

An 18 mm gallstone is seen impacted within the gallbladder neck and
the gallbladder is mildly distended. However, there is no
gallbladder wall thickening, and no pericholecystic fluid is
identified. The sonographic Murphy sign is reportedly negative.

Common bile duct:

Diameter: 4 mm in proximal diameter

Liver:

No focal lesion identified. Within normal limits in parenchymal
echogenicity. Portal vein is patent on color Doppler imaging with
normal direction of blood flow towards the liver.

Other: None.
IMPRESSION: Cholelithiasis with 18 mm gallstone impacted within the gallbladder
neck. Mild associated gallbladder distension. The findings can be
seen in early changes of acute cholecystitis and correlation with
liver enzymes may be helpful.

## 2022-12-03 ENCOUNTER — Encounter (INDEPENDENT_AMBULATORY_CARE_PROVIDER_SITE_OTHER): Payer: Self-pay | Admitting: Adult Health

## 2022-12-03 ENCOUNTER — Ambulatory Visit (INDEPENDENT_AMBULATORY_CARE_PROVIDER_SITE_OTHER): Payer: 59 | Admitting: Adult Health

## 2022-12-03 VITALS — BP 129/70 | HR 63 | Temp 98.0°F | Ht 67.0 in | Wt 244.0 lb

## 2022-12-03 DIAGNOSIS — E669 Obesity, unspecified: Secondary | ICD-10-CM | POA: Diagnosis not present

## 2022-12-03 DIAGNOSIS — Z6838 Body mass index (BMI) 38.0-38.9, adult: Secondary | ICD-10-CM

## 2022-12-03 DIAGNOSIS — I10 Essential (primary) hypertension: Secondary | ICD-10-CM | POA: Diagnosis not present

## 2022-12-03 DIAGNOSIS — R7303 Prediabetes: Secondary | ICD-10-CM

## 2022-12-03 DIAGNOSIS — I480 Paroxysmal atrial fibrillation: Secondary | ICD-10-CM | POA: Diagnosis not present

## 2022-12-03 NOTE — Progress Notes (Signed)
WEIGHT SUMMARY AND BIOMETRICS  Vitals Temp: 98 F (36.7 C) BP: 129/70 Pulse Rate: 63 SpO2: 97 %   Anthropometric Measurements Height: 5\' 7"  (1.702 m) Weight: 244 lb (110.7 kg) BMI (Calculated): 38.21 Weight at Last Visit: 231 lb Weight Lost Since Last Visit: 0 Weight Gained Since Last Visit: 13 lb Starting Weight: 263 lb Total Weight Loss (lbs): 19 lb (8.618 kg)   Body Composition  Body Fat %: 49.6 % Fat Mass (lbs): 121 lbs Muscle Mass (lbs): 116.8 lbs Total Body Water (lbs): 0 lbs Visceral Fat Rating : 15   Other Clinical Data Fasting: yes Labs: no Today's Visit #: 55 Starting Date: 02/24/18    Chief Complaint:   OBESITY Tonya Nicholson is here to discuss her progress with her obesity treatment plan. She is on the the Category 1 Plan and states she is following her eating plan approximately 0 % of the time.  She states she is not currently exercising.   Interim History:  Last OV with [facility] on 06/25/2022  She has been working 120 hours per pay period!!!  Her granddaughter "[name]" was born via C section 11/04/2022  Reviewed Bioimpedance results with pt: Muscle Mass:+1.8 lbs Adipose Mass:+10.8 lbs  Home Supply 4 doses Wegovy 1mg  4 doses Wegovy 1.7mg  7 doses Wegovy 2.4mg   Subjective:   1. PAF (paroxysmal atrial fibrillation) (HCC) amLODipine-benazepril (LOTREL) 5-40 MG capsule  apixaban (ELIQUIS) 5 MG TABS tablet  flecainide (TAMBOCOR) 100 MG tablet  metoprolol tartrate (LOPRESSOR) 25 MG tablet   11/20/2021 EKG- reviewed in system  2. Essential hypertension BP at goal at OV She is on amLODipine-benazepril (LOTREL) 5-40 MG capsule  apixaban (ELIQUIS) 5 MG TABS tablet  flecainide (TAMBOCOR) 100 MG tablet  metoprolol tartrate (LOPRESSOR) 25 MG tablet   She has not been able to attend her gym/exercise due to a VERY hectic schedule  3. Prediabetes Last dose of Wegovy 1mg  was this week 11/30/2022 Denies mass in neck, dysphagia, dyspepsia,  persistent hoarseness, abdominal pain, or N/V/C   Assessment/Plan:   1. PAF (paroxysmal atrial fibrillation) (HCC) Continue amLODipine-benazepril (LOTREL) 5-40 MG capsule  apixaban (ELIQUIS) 5 MG TABS tablet  flecainide (TAMBOCOR) 100 MG tablet  metoprolol tartrate (LOPRESSOR) 25 MG tablet   2. Essential hypertension Continue amLODipine-benazepril (LOTREL) 5-40 MG capsule  apixaban (ELIQUIS) 5 MG TABS tablet  flecainide (TAMBOCOR) 100 MG tablet  metoprolol tartrate (LOPRESSOR) 25 MG tablet   3. Prediabetes Resume Cat 1 meal, regular exercise, and Wegovy injections as directed  4. Current BMI 38.12 Slowly resume Wegovy 1mg - inject Q10days  Titration schedule provided  Nyliah is not currently in the action stage of change. As such, her goal is to get back to weightloss efforts . She has agreed to the Category 1 Plan.   Exercise goals: All adults should avoid inactivity. Some physical activity is better than none, and adults who participate in any amount of physical activity gain some health benefits. Adults should also include muscle-strengthening activities that involve all major muscle groups on 2 or more days a week.  Behavioral modification strategies: increasing lean protein intake, decreasing simple carbohydrates, increasing vegetables, increasing water intake, decreasing eating out, no skipping meals, meal planning and cooking strategies, keeping healthy foods in the home, ways to avoid boredom eating, ways to avoid night time snacking, holiday eating strategies , and planning for success.  [name] has agreed to follow-up with our clinic in 3 weeks. She was informed of the importance of frequent follow-up visits to  maximize her success with intensive lifestyle modifications for her multiple health conditions.   Objective:   Blood pressure 129/70, pulse 63, temperature 98 F (36.7 C), height 5\' 7"  (1.702 m), weight 244 lb (110.7 kg), last menstrual period 04/09/2016, SpO2  97%. Body mass index is 38.22 kg/m.  General: Cooperative, alert, well developed, in no acute distress. HEENT: Conjunctivae and lids unremarkable. Cardiovascular: Regular rhythm.  Lungs: Normal work of breathing. Neurologic: No focal deficits.   Lab Results  Component Value Date   CREATININE 0.86 02/05/2022   BUN 16 02/05/2022   NA 141 02/05/2022   K 4.8 02/05/2022   CL 103 02/05/2022   CO2 23 02/05/2022   Lab Results  Component Value Date   ALT 13 02/05/2022   AST 18 02/05/2022   ALKPHOS 97 02/05/2022   BILITOT 0.5 02/05/2022   Lab Results  Component Value Date   HGBA1C 5.1 02/05/2022   HGBA1C 5.5 07/15/2021   HGBA1C 5.2 06/18/2021   HGBA1C 5.3 04/10/2021   HGBA1C 6.0 (H) 09/26/2020   Lab Results  Component Value Date   INSULIN 4.9 02/05/2022   INSULIN 12.8 04/10/2021   INSULIN 11.0 09/26/2020   INSULIN 9.4 04/25/2020   INSULIN 14.5 06/29/2019   Lab Results  Component Value Date   TSH 1.410 02/24/2018   Lab Results  Component Value Date   CHOL 166 04/25/2020   HDL 54 04/25/2020   LDLCALC 98 04/25/2020   TRIG 71 04/25/2020   CHOLHDL 3.1 04/25/2020   Lab Results  Component Value Date   VD25OH 48.6 02/05/2022   VD25OH 77.7 07/15/2021   VD25OH 58.6 04/10/2021   Lab Results  Component Value Date   WBC 5.6 07/11/2021   HGB 13.8 07/11/2021   HCT 41.0 07/11/2021   MCV 90 07/11/2021   PLT 284 07/11/2021   No results found for: "IRON", "TIBC", "FERRITIN"  Attestation Statements:   Reviewed by clinician on day of visit: allergies, medications, problem list, medical history, surgical history, family history, social history, and previous encounter notes.  I have reviewed the above documentation for accuracy and completeness, and I agree with the above. -  Kaoir Loree d. Charlaine Utsey, NP-C

## 2022-12-17 ENCOUNTER — Other Ambulatory Visit (HOSPITAL_COMMUNITY): Payer: Self-pay

## 2022-12-17 ENCOUNTER — Other Ambulatory Visit (INDEPENDENT_AMBULATORY_CARE_PROVIDER_SITE_OTHER): Payer: Self-pay | Admitting: Physician Assistant

## 2022-12-17 DIAGNOSIS — I10 Essential (primary) hypertension: Secondary | ICD-10-CM

## 2022-12-17 MED ORDER — METOPROLOL TARTRATE 25 MG PO TABS
25.0000 mg | ORAL_TABLET | Freq: Two times a day (BID) | ORAL | 0 refills | Status: DC
  Filled 2022-12-17: qty 30, 15d supply, fill #0

## 2022-12-18 ENCOUNTER — Other Ambulatory Visit (HOSPITAL_COMMUNITY): Payer: Self-pay

## 2022-12-18 MED ORDER — AMLODIPINE BESY-BENAZEPRIL HCL 5-40 MG PO CAPS
1.0000 | ORAL_CAPSULE | Freq: Every day | ORAL | 3 refills | Status: DC
  Filled 2022-12-18: qty 90, 90d supply, fill #0
  Filled 2023-03-17: qty 90, 90d supply, fill #1
  Filled 2023-06-16: qty 90, 90d supply, fill #2
  Filled 2023-09-14: qty 90, 90d supply, fill #3

## 2022-12-18 MED ORDER — AMLODIPINE BESY-BENAZEPRIL HCL 5-40 MG PO CAPS
1.0000 | ORAL_CAPSULE | Freq: Every day | ORAL | 3 refills | Status: DC
  Filled 2022-12-18 – 2023-12-14 (×2): qty 90, 90d supply, fill #0
  Filled 2024-02-28: qty 30, 30d supply, fill #1
  Filled 2024-02-28: qty 5, 5d supply, fill #1

## 2022-12-22 ENCOUNTER — Encounter (INDEPENDENT_AMBULATORY_CARE_PROVIDER_SITE_OTHER): Payer: Self-pay | Admitting: Adult Health

## 2022-12-22 ENCOUNTER — Ambulatory Visit (INDEPENDENT_AMBULATORY_CARE_PROVIDER_SITE_OTHER): Payer: 59 | Admitting: Adult Health

## 2022-12-22 VITALS — BP 115/73 | HR 54 | Temp 97.7°F | Ht 67.0 in | Wt 249.0 lb

## 2022-12-22 DIAGNOSIS — I10 Essential (primary) hypertension: Secondary | ICD-10-CM | POA: Diagnosis not present

## 2022-12-22 DIAGNOSIS — E559 Vitamin D deficiency, unspecified: Secondary | ICD-10-CM

## 2022-12-22 DIAGNOSIS — Z0000 Encounter for general adult medical examination without abnormal findings: Secondary | ICD-10-CM

## 2022-12-22 DIAGNOSIS — R7303 Prediabetes: Secondary | ICD-10-CM | POA: Diagnosis not present

## 2022-12-22 DIAGNOSIS — E669 Obesity, unspecified: Secondary | ICD-10-CM

## 2022-12-22 DIAGNOSIS — Z6838 Body mass index (BMI) 38.0-38.9, adult: Secondary | ICD-10-CM | POA: Diagnosis not present

## 2022-12-22 NOTE — Progress Notes (Signed)
WEIGHT SUMMARY AND BIOMETRICS  Vitals Temp: 97.7 F (36.5 C) BP: 115/73 Pulse Rate: (!) 54 SpO2: 97 %   Anthropometric Measurements Height: 5\' 7"  (1.702 m) Weight: 249 lb (112.9 kg) BMI (Calculated): 38.99 Weight at Last Visit: 244lb Weight Lost Since Last Visit: 0 Weight Gained Since Last Visit: 5lb Starting Weight: 263lb Total Weight Loss (lbs): 14 lb (6.35 kg)   Body Composition  Body Fat %: 51.4 % Fat Mass (lbs): 128 lbs Muscle Mass (lbs): 115 lbs Visceral Fat Rating : 16   Other Clinical Data Fasting: no Labs: no Today's Visit #: 56 Starting Date: 02/24/18    Chief Complaint:   OBESITY Tonya Nicholson is here to discuss her progress with her obesity treatment plan. She is on the the Category 1 Plan and states she is following her eating plan approximately 50 % of the time. She states she is exercising Gym 30 minutes 2 times per week.   Interim History:  She reports more snacking the last several weeks, however she has returned to the gym- at least 2 x week. She continues to work significant overtime (OT), she is averaging 125 hours per pay period (equates to 45 hours of OT).  Her grandchild, "Tonya Nicholson" has secured admission into a reputable daycare  Last does of Wegovy 1mg  was on 12/10/2022 Denies mass in neck, dysphagia, dyspepsia, persistent hoarseness, abdominal pain, or N/V/C   Of note- unable to collect fasting labs, as she consumed breakfast prior to OV- will complete at next morning OV.  Subjective:   1. Healthcare maintenance 12/10/2022 Wegovy 1mg  injection 12/22/2022- will inject Wegovy 1mg  tonight She has been spacing injection Q 10-12 days Per pt, she has the following supple at home: 3 doses of Wegovyy 1mg  5-7 doses of Wegovy 1.7mg  5-7 doses of Wegovy 2.4mg    2. Prediabetes Lab Results  Component Value Date   HGBA1C 5.1 02/05/2022   HGBA1C 5.5 07/15/2021   HGBA1C 5.2 06/18/2021    She recently resumed Wegovy 1mg  injection- last dose  12/10/2022  3. Vitamin D deficiency  Latest Reference Range & Units 07/15/21 16:37 02/05/22 11:12  Vitamin D, 25-Hydroxy 30.0 - 100.0 ng/mL 77.7 48.6   She stopped OTC Vit D 3 supplementation > 3 months ago Unsure of dosage  4. Essential hypertension BP at goal She denies CP or dyspnea with at the gum She is currently on apixaban (ELIQUIS) 5 MG TABS tablet  flecainide (TAMBOCOR) 100 MG tablet  metoprolol tartrate (LOPRESSOR) 25 MG tablet  amLODipine-benazepril (LOTREL) 5-40 MG capsule   Assessment/Plan:   1. Healthcare maintenance Check Labs at next OV Increase compliance to at least 80% or more on Cat 1 Meal Plan  2. Prediabetes Check Labs at next OV Inject Wegovy 1mg  tonight Increase compliance to at least 80% or more on Cat 1 Meal Plan  3. Vitamin D deficiency Check Labs at next OV  4. Essential hypertension Continue apixaban (ELIQUIS) 5 MG TABS tablet  flecainide (TAMBOCOR) 100 MG tablet  metoprolol tartrate (LOPRESSOR) 25 MG tablet  amLODipine-benazepril (LOTREL) 5-40 MG capsule  Increase compliance to at least 80% or more on Cat 1 Meal Plan  5. Obesity, current BMI 38.99  Shelvy is not currently in the action stage of change. As such, her goal is to get back to weightloss efforts . She has agreed to the Category 1 Plan.   Exercise goals: For substantial health benefits, adults should do at least 150 minutes (2 hours and 30 minutes) a week  of moderate-intensity, or 75 minutes (1 hour and 15 minutes) a week of vigorous-intensity aerobic physical activity, or an equivalent combination of moderate- and vigorous-intensity aerobic activity. Aerobic activity should be performed in episodes of at least 10 minutes, and preferably, it should be spread throughout the week.  Increase compliance to at least 80% or more on Cat 1 Meal Plan  Behavioral modification strategies: increasing lean protein intake, decreasing simple carbohydrates, increasing vegetables, meal planning  and cooking strategies, keeping healthy foods in the home, ways to avoid boredom eating, holiday eating strategies , and planning for success.  Davinee has agreed to follow-up with our clinic in 4 weeks. She was informed of the importance of frequent follow-up visits to maximize her success with intensive lifestyle modifications for her multiple health conditions.   Check Fasting Labs at next OV.  Objective:   Blood pressure 115/73, pulse (!) 54, temperature 97.7 F (36.5 C), height 5\' 7"  (1.702 m), weight 249 lb (112.9 kg), last menstrual period 04/09/2016, SpO2 97%. Body mass index is 39 kg/m.  General: Cooperative, alert, well developed, in no acute distress. HEENT: Conjunctivae and lids unremarkable. Cardiovascular: Regular rhythm.  Lungs: Normal work of breathing. Neurologic: No focal deficits.   Lab Results  Component Value Date   CREATININE 0.86 02/05/2022   BUN 16 02/05/2022   NA 141 02/05/2022   K 4.8 02/05/2022   CL 103 02/05/2022   CO2 23 02/05/2022   Lab Results  Component Value Date   ALT 13 02/05/2022   AST 18 02/05/2022   ALKPHOS 97 02/05/2022   BILITOT 0.5 02/05/2022   Lab Results  Component Value Date   HGBA1C 5.1 02/05/2022   HGBA1C 5.5 07/15/2021   HGBA1C 5.2 06/18/2021   HGBA1C 5.3 04/10/2021   HGBA1C 6.0 (H) 09/26/2020   Lab Results  Component Value Date   INSULIN 4.9 02/05/2022   INSULIN 12.8 04/10/2021   INSULIN 11.0 09/26/2020   INSULIN 9.4 04/25/2020   INSULIN 14.5 06/29/2019   Lab Results  Component Value Date   TSH 1.410 02/24/2018   Lab Results  Component Value Date   CHOL 166 04/25/2020   HDL 54 04/25/2020   LDLCALC 98 04/25/2020   TRIG 71 04/25/2020   CHOLHDL 3.1 04/25/2020   Lab Results  Component Value Date   VD25OH 48.6 02/05/2022   VD25OH 77.7 07/15/2021   VD25OH 58.6 04/10/2021   Lab Results  Component Value Date   WBC 5.6 07/11/2021   HGB 13.8 07/11/2021   HCT 41.0 07/11/2021   MCV 90 07/11/2021   PLT 284  07/11/2021   No results found for: "IRON", "TIBC", "FERRITIN"  Attestation Statements:   Reviewed by clinician on day of visit: allergies, medications, problem list, medical history, surgical history, family history, social history, and previous encounter notes.  Time spent on visit including pre-visit chart review and post-visit care and charting was 28 minutes.   I have reviewed the above documentation for accuracy and completeness, and I agree with the above. -  Kyian Obst d. Sammie Denner, NP-C

## 2022-12-31 ENCOUNTER — Other Ambulatory Visit (INDEPENDENT_AMBULATORY_CARE_PROVIDER_SITE_OTHER): Payer: Self-pay | Admitting: Physician Assistant

## 2022-12-31 DIAGNOSIS — I10 Essential (primary) hypertension: Secondary | ICD-10-CM

## 2023-01-01 ENCOUNTER — Other Ambulatory Visit (HOSPITAL_COMMUNITY): Payer: Self-pay

## 2023-01-01 ENCOUNTER — Other Ambulatory Visit: Payer: Self-pay | Admitting: *Deleted

## 2023-01-01 DIAGNOSIS — I10 Essential (primary) hypertension: Secondary | ICD-10-CM

## 2023-01-01 MED ORDER — METOPROLOL TARTRATE 25 MG PO TABS
25.0000 mg | ORAL_TABLET | Freq: Two times a day (BID) | ORAL | 2 refills | Status: DC
  Filled 2023-01-01: qty 180, 90d supply, fill #0
  Filled 2023-03-29: qty 180, 90d supply, fill #1
  Filled 2023-06-29: qty 180, 90d supply, fill #2

## 2023-01-01 MED ORDER — FLECAINIDE ACETATE 100 MG PO TABS
100.0000 mg | ORAL_TABLET | Freq: Two times a day (BID) | ORAL | 2 refills | Status: DC
  Filled 2023-01-01: qty 180, 90d supply, fill #0
  Filled 2023-03-25: qty 60, 30d supply, fill #1
  Filled 2023-04-28: qty 30, 15d supply, fill #2

## 2023-01-13 ENCOUNTER — Ambulatory Visit (INDEPENDENT_AMBULATORY_CARE_PROVIDER_SITE_OTHER): Payer: 59 | Admitting: Adult Health

## 2023-01-13 ENCOUNTER — Other Ambulatory Visit: Payer: Self-pay | Admitting: Cardiology

## 2023-01-13 ENCOUNTER — Encounter (INDEPENDENT_AMBULATORY_CARE_PROVIDER_SITE_OTHER): Payer: Self-pay | Admitting: Adult Health

## 2023-01-13 VITALS — BP 116/71 | HR 48 | Temp 97.8°F | Ht 67.0 in | Wt 249.0 lb

## 2023-01-13 DIAGNOSIS — I4819 Other persistent atrial fibrillation: Secondary | ICD-10-CM

## 2023-01-13 DIAGNOSIS — Z0000 Encounter for general adult medical examination without abnormal findings: Secondary | ICD-10-CM

## 2023-01-13 DIAGNOSIS — E66812 Obesity, class 2: Secondary | ICD-10-CM

## 2023-01-13 DIAGNOSIS — E559 Vitamin D deficiency, unspecified: Secondary | ICD-10-CM

## 2023-01-13 DIAGNOSIS — R7303 Prediabetes: Secondary | ICD-10-CM

## 2023-01-13 DIAGNOSIS — E669 Obesity, unspecified: Secondary | ICD-10-CM | POA: Diagnosis not present

## 2023-01-13 DIAGNOSIS — I10 Essential (primary) hypertension: Secondary | ICD-10-CM | POA: Diagnosis not present

## 2023-01-13 DIAGNOSIS — Z6839 Body mass index (BMI) 39.0-39.9, adult: Secondary | ICD-10-CM

## 2023-01-13 NOTE — Progress Notes (Signed)
WEIGHT SUMMARY AND BIOMETRICS  Vitals Temp: 97.8 F (36.6 C) BP: 116/71 Pulse Rate: (!) 48 SpO2: 98 %   Anthropometric Measurements Height: 5\' 7"  (1.702 m) Weight: 249 lb (112.9 kg) BMI (Calculated): 38.99 Weight at Last Visit: 249lb Weight Lost Since Last Visit: 0 Weight Gained Since Last Visit: 0 Starting Weight: 263lb Total Weight Loss (lbs): 14 lb (6.35 kg)   Body Composition  Body Fat %: 51.6 % Fat Mass (lbs): 128.6 lbs Muscle Mass (lbs): 114.6 lbs Visceral Fat Rating : 16   Other Clinical Data Fasting: yes Labs: yes Today's Visit #: 52 Starting Date: 02/24/18    Chief Complaint:   OBESITY Tonya Nicholson is here to discuss her progress with her obesity treatment plan. She is on the the Category 1 Plan and states she is following her eating plan approximately 50 % of the time.  She states she is exercising [employer]- 30 mins at least 2 x week.   Interim History:  Last dose of Wegovy 1mg  was 12/10/2022 She has decided to hold any more GLP-1 injections until after the New Year.  She will take off from work from 23 Dec until 6 Jan She will travel to [place] and [place] She will also have the opportunity to visit with her daughter, son-in-law, and new granddaughter  During her time off, she plans on resuming regular exercise at [employer] and [place] 1mg  injections.   Subjective:   1. Prediabetes Lab Results  Component Value Date   HGBA1C 5.1 02/05/2022   HGBA1C 5.5 07/15/2021   HGBA1C 5.2 06/18/2021   Last dose of Wegovy 1mg  was 12/10/2022 She has decided to hold any more GLP-1 injections until after the New Year.  Resume in 2025- be aware not overeat and increase fluids  2. Essential hypertension BP at goal at OV She denies CP with exertion She is currently on amLODipine-benazepril (LOTREL) 5-40 MG capsule  metoprolol tartrate (LOPRESSOR) 25 MG tablet  flecainide (TAMBOCOR) 100 MG tablet   3. Healthcare maintenance She endorses  stable energy levels. She continues to work a significant amount of OT at work- [employer] Most weeks, she will work 5 x 12 hr shifts  4. Vitamin D deficiency She endorses stable energy levels. She continues to work significant amount of OT- >110 hrs per pay period.   Assessment/Plan:   1. Prediabetes (Primary) Check Labs - Hemoglobin A1c - Insulin, random  2. Essential hypertension Check Labs - Comprehensive metabolic panel Continue amLODipine-benazepril (LOTREL) 5-40 MG capsule  metoprolol tartrate (LOPRESSOR) 25 MG tablet  flecainide (TAMBOCOR) 100 MG tablet   3. Healthcare maintenance Check Labs - Vitamin B12 - Lipid panel  4. Vitamin D deficiency Check Labs - VITAMIN D 25 Hydroxy (Vit-D Deficiency, Fractures)  5. Obesity, current BMI 39.1  Tonya Nicholson is currently in the action stage of change. As such, her goal is to maintain weight for now. She has agreed to the Category 1 Plan.   Exercise goals: For substantial health benefits, adults should do at least 150 minutes (2 hours and 30 minutes) a week of moderate-intensity, or 75 minutes (1 hour and 15 minutes) a week of vigorous-intensity aerobic physical activity, or an equivalent combination of moderate- and vigorous-intensity aerobic activity. Aerobic activity should be performed in episodes of at least 10 minutes, and preferably, it should be spread throughout the week.  Behavioral modification strategies: increasing lean protein intake, decreasing simple carbohydrates, increasing vegetables, increasing water intake, meal planning and cooking strategies,  keeping healthy foods in the home, ways to avoid boredom eating, ways to avoid night time snacking, better snacking choices, emotional eating strategies, travel eating strategies, holiday eating strategies , and planning for success.  Tonya Nicholson has agreed to follow-up with our clinic in 4 weeks. She was informed of the importance of frequent follow-up visits to maximize her  success with intensive lifestyle modifications for her multiple health conditions.   Tonya Nicholson was informed we would discuss her lab results at her next visit unless there is a critical issue that needs to be addressed sooner. Tonya Nicholson agreed to keep her next visit at the agreed upon time to discuss these results.  Objective:   Blood pressure 116/71, pulse (!) 48, temperature 97.8 F (36.6 C), height 5\' 7"  (1.702 m), weight 249 lb (112.9 kg), last menstrual period 04/09/2016, SpO2 98%. Body mass index is 39 kg/m.  General: Cooperative, alert, well developed, in no acute distress. HEENT: Conjunctivae and lids unremarkable. Cardiovascular: Regular rhythm.  Lungs: Normal work of breathing. Neurologic: No focal deficits.   Lab Results  Component Value Date   CREATININE 0.86 02/05/2022   BUN 16 02/05/2022   NA 141 02/05/2022   K 4.8 02/05/2022   CL 103 02/05/2022   CO2 23 02/05/2022   Lab Results  Component Value Date   ALT 13 02/05/2022   AST 18 02/05/2022   ALKPHOS 97 02/05/2022   BILITOT 0.5 02/05/2022   Lab Results  Component Value Date   HGBA1C 5.1 02/05/2022   HGBA1C 5.5 07/15/2021   HGBA1C 5.2 06/18/2021   HGBA1C 5.3 04/10/2021   HGBA1C 6.0 (H) 09/26/2020   Lab Results  Component Value Date   INSULIN 4.9 02/05/2022   INSULIN 12.8 04/10/2021   INSULIN 11.0 09/26/2020   INSULIN 9.4 04/25/2020   INSULIN 14.5 06/29/2019   Lab Results  Component Value Date   TSH 1.410 02/24/2018   Lab Results  Component Value Date   CHOL 166 04/25/2020   HDL 54 04/25/2020   LDLCALC 98 04/25/2020   TRIG 71 04/25/2020   CHOLHDL 3.1 04/25/2020   Lab Results  Component Value Date   VD25OH 48.6 02/05/2022   VD25OH 77.7 07/15/2021   VD25OH 58.6 04/10/2021   Lab Results  Component Value Date   WBC 5.6 07/11/2021   HGB 13.8 07/11/2021   HCT 41.0 07/11/2021   MCV 90 07/11/2021   PLT 284 07/11/2021   No results found for: "IRON", "TIBC", "FERRITIN"  Attestation Statements:    Reviewed by clinician on day of visit: allergies, medications, problem list, medical history, surgical history, family history, social history, and previous encounter notes.  I have reviewed the above documentation for accuracy and completeness, and I agree with the above. -  Mykai Wendorf d. Kholton Coate, NP-C

## 2023-01-14 ENCOUNTER — Other Ambulatory Visit (HOSPITAL_COMMUNITY): Payer: Self-pay

## 2023-01-14 LAB — COMPREHENSIVE METABOLIC PANEL
ALT: 17 [IU]/L (ref 0–32)
AST: 17 [IU]/L (ref 0–40)
Albumin: 3.7 g/dL — ABNORMAL LOW (ref 3.9–4.9)
Alkaline Phosphatase: 87 [IU]/L (ref 44–121)
BUN/Creatinine Ratio: 17 (ref 12–28)
BUN: 16 mg/dL (ref 8–27)
Bilirubin Total: 0.4 mg/dL (ref 0.0–1.2)
CO2: 23 mmol/L (ref 20–29)
Calcium: 8.7 mg/dL (ref 8.7–10.3)
Chloride: 105 mmol/L (ref 96–106)
Creatinine, Ser: 0.94 mg/dL (ref 0.57–1.00)
Globulin, Total: 2.9 g/dL (ref 1.5–4.5)
Glucose: 115 mg/dL — ABNORMAL HIGH (ref 70–99)
Potassium: 4.6 mmol/L (ref 3.5–5.2)
Sodium: 140 mmol/L (ref 134–144)
Total Protein: 6.6 g/dL (ref 6.0–8.5)
eGFR: 69 mL/min/{1.73_m2} (ref >59)

## 2023-01-14 LAB — LIPID PANEL
Chol/HDL Ratio: 2.8 {ratio} (ref 0.0–4.4)
Cholesterol, Total: 161 mg/dL (ref 100–199)
HDL: 58 mg/dL (ref >39)
LDL Chol Calc (NIH): 90 mg/dL (ref 0–99)
Triglycerides: 69 mg/dL (ref 0–149)
VLDL Cholesterol Cal: 13 mg/dL (ref 5–40)

## 2023-01-14 LAB — VITAMIN D 25 HYDROXY (VIT D DEFICIENCY, FRACTURES): Vit D, 25-Hydroxy: 34.6 ng/mL (ref 30.0–100.0)

## 2023-01-14 LAB — HEMOGLOBIN A1C
Est. average glucose Bld gHb Est-mCnc: 120 mg/dL
Hgb A1c MFr Bld: 5.8 % — ABNORMAL HIGH (ref 4.8–5.6)

## 2023-01-14 LAB — VITAMIN B12: Vitamin B-12: 323 pg/mL (ref 232–1245)

## 2023-01-14 LAB — INSULIN, RANDOM: INSULIN: 9.5 u[IU]/mL (ref 2.6–24.9)

## 2023-01-14 MED ORDER — APIXABAN 5 MG PO TABS
5.0000 mg | ORAL_TABLET | Freq: Two times a day (BID) | ORAL | 0 refills | Status: DC
  Filled 2023-01-14: qty 180, 90d supply, fill #0

## 2023-01-14 NOTE — Telephone Encounter (Signed)
Prescription refill request for Eliquis received. Indication: AF Last office visit: 11/20/21  Carleene Mains MD Scr: 0.86 on 02/05/22  Epic Age: 62 Weight: 101.2kg  Based on above findings Eliquis 5mg  twice daily is the appropriate dose.  Pt is past due for appt with Dr Elberta Fortis.  Message sent to schedulers to make appt.  Refill approved x 1

## 2023-01-18 ENCOUNTER — Other Ambulatory Visit (HOSPITAL_COMMUNITY): Payer: Self-pay

## 2023-02-04 ENCOUNTER — Other Ambulatory Visit (HOSPITAL_COMMUNITY): Payer: Self-pay

## 2023-02-04 ENCOUNTER — Ambulatory Visit (INDEPENDENT_AMBULATORY_CARE_PROVIDER_SITE_OTHER): Payer: 59 | Admitting: Adult Health

## 2023-02-04 ENCOUNTER — Encounter (INDEPENDENT_AMBULATORY_CARE_PROVIDER_SITE_OTHER): Payer: Self-pay | Admitting: Adult Health

## 2023-02-04 VITALS — BP 116/73 | HR 53 | Temp 97.6°F | Ht 67.0 in | Wt 251.0 lb

## 2023-02-04 DIAGNOSIS — Z6839 Body mass index (BMI) 39.0-39.9, adult: Secondary | ICD-10-CM | POA: Diagnosis not present

## 2023-02-04 DIAGNOSIS — I480 Paroxysmal atrial fibrillation: Secondary | ICD-10-CM

## 2023-02-04 DIAGNOSIS — Z0000 Encounter for general adult medical examination without abnormal findings: Secondary | ICD-10-CM

## 2023-02-04 DIAGNOSIS — E669 Obesity, unspecified: Secondary | ICD-10-CM

## 2023-02-04 DIAGNOSIS — E66812 Obesity, class 2: Secondary | ICD-10-CM

## 2023-02-04 DIAGNOSIS — E559 Vitamin D deficiency, unspecified: Secondary | ICD-10-CM

## 2023-02-04 DIAGNOSIS — I10 Essential (primary) hypertension: Secondary | ICD-10-CM | POA: Diagnosis not present

## 2023-02-04 DIAGNOSIS — R7303 Prediabetes: Secondary | ICD-10-CM | POA: Diagnosis not present

## 2023-02-04 MED ORDER — VITAMIN D (ERGOCALCIFEROL) 1.25 MG (50000 UNIT) PO CAPS
50000.0000 [IU] | ORAL_CAPSULE | ORAL | 0 refills | Status: DC
  Filled 2023-02-04: qty 8, 56d supply, fill #0

## 2023-02-04 NOTE — Progress Notes (Signed)
 WEIGHT SUMMARY AND BIOMETRICS  Vitals Temp: 97.6 F (36.4 C) BP: 116/73 Pulse Rate: (!) 53 SpO2: 99 %   Anthropometric Measurements Height: 5' 7 (1.702 m) Weight: 251 lb (113.9 kg) BMI (Calculated): 39.3 Weight at Last Visit: 249lb Weight Lost Since Last Visit: 0 Weight Gained Since Last Visit: 2lb Starting Weight: 263lb Total Weight Loss (lbs): 12 lb (5.443 kg)   Body Composition  Body Fat %: 50.3 % Fat Mass (lbs): 126.2 lbs Muscle Mass (lbs): 118.4 lbs Total Body Water (lbs): 99.6 lbs Visceral Fat Rating : 16   Other Clinical Data Fasting: no Labs: no Today's Visit #: 72 Starting Date: 02/24/18    Chief Complaint:   OBESITY Tonya Nicholson is here to discuss her progress with her obesity treatment plan. She is on the the Category 1 Plan and states she is following her eating plan approximately 50 % of the time.  She states she is exercising: None   Interim History:  Tonya Nicholson has been staying with her daughter, son-inl-law, and new granddaughter on/off the last 13 weeks. She has been assisting with house work and child care. She has had VERY limited time to rest, exercise, grocery shop, meal plan/prep. She reports frequent skipping or snacking the last several months.  Her granddaughter started [employer] this week.  She is hopeful that the new family will settle into a regular routine and will need less assistance from her.  She continues to work frequent OT.  She has NOT restarted Wegovy  therapy- she has multiple boxes at various doses at home.  Subjective:   1. Healthcare maintenance Discussed Labs  Lipid Panel     Component Value Date/Time   CHOL 161 01/13/2023 0751   TRIG 69 01/13/2023 0751   HDL 58 01/13/2023 0751   CHOLHDL 2.8 01/13/2023 0751   LDLCALC 90 01/13/2023 0751   LABVLDL 13 01/13/2023 0751   The 10-year ASCVD risk score (Arnett DK, et al., 2019) is: 3.8%   Values used to calculate the score:     Age: 63 years     Sex:  Female     Is Non-Hispanic African American: No     Diabetic: No     Tobacco smoker: No     Systolic Blood Pressure: 116 mmHg     Is BP treated: Yes     HDL Cholesterol: 58 mg/dL     Total Cholesterol: 161 mg/dL    Latest Reference Range & Units 02/05/22 11:12 01/13/23 07:51  Vitamin B12 232 - 1,245 pg/mL 370 323    Lipid panel stable  2. PAF (paroxysmal atrial fibrillation) (HCC) Discussed Labs  BP/HR stable at OV She is on amLODipine -benazepril  (LOTREL ) 5-40 MG capsule  metoprolol  tartrate (LOPRESSOR ) 25 MG tablet  flecainide  (TAMBOCOR ) 100 MG tablet  apixaban  (ELIQUIS ) 5 MG TABS tablet   She denies recent palpitations/CP  3. Prediabetes Discussed Labs  Lab Results  Component Value Date   HGBA1C 5.8 (H) 01/13/2023   HGBA1C 5.1 02/05/2022   HGBA1C 5.5 07/15/2021     Latest Reference Range & Units 01/13/23 07:51  Glucose 70 - 99 mg/dL 884 (H)  Hemoglobin J8R 4.8 - 5.6 % 5.8 (H)  Est. average glucose Bld gHb Est-mCnc mg/dL 879  INSULIN  2.6 - 24.9 uIU/mL 9.5  (H): Data is abnormally high  CBG, A1c, and Insulin  levels are worsened. She states do you know how much sugar is on Christmas?  She has not restarted Wegovy  therapy  4. Essential hypertension  Discussed Labs  01/13/2023 CMP: Electrolytes, kidney/liver enzymes are stable She is on amLODipine -benazepril  (LOTREL ) 5-40 MG capsule  metoprolol  tartrate (LOPRESSOR ) 25 MG tablet  flecainide  (TAMBOCOR ) 100 MG tablet  apixaban  (ELIQUIS ) 5 MG TABS tablet   5. Vitamin D  deficiency Discussed Labs   Latest Reference Range & Units 02/05/22 11:12 01/13/23 07:51  Vitamin D , 25-Hydroxy 30.0 - 100.0 ng/mL 48.6 34.6   She is on daily OTC Vit D supplementation- unsure of strength  Assessment/Plan:   1. Healthcare maintenance Start Ergocalciferol   2. PAF (paroxysmal atrial fibrillation) (HCC) Continue amLODipine -benazepril  (LOTREL ) 5-40 MG capsule  metoprolol  tartrate (LOPRESSOR ) 25 MG tablet  flecainide   (TAMBOCOR ) 100 MG tablet  apixaban  (ELIQUIS ) 5 MG TABS tablet   3. Prediabetes (Primary) Send MyChart with Wegovy  supply- will send back a titration schedule  4. Essential hypertension Continue amLODipine -benazepril  (LOTREL ) 5-40 MG capsule  metoprolol  tartrate (LOPRESSOR ) 25 MG tablet  flecainide  (TAMBOCOR ) 100 MG tablet  apixaban  (ELIQUIS ) 5 MG TABS tablet   5. Vitamin D  deficiency Stop OTC Vit D Start Vitamin D , Ergocalciferol , (DRISDOL ) 1.25 MG (50000 UNIT) CAPS capsule Take 1 capsule (50,000 Units total) by mouth every 7 (seven) days. Dispense: 8 capsule, Refills: 0 of 0 remaining    6. Obesity, current BMI 39.3  Tonya Nicholson is currently in the action stage of change. As such, her goal is to continue with weight loss efforts. She has agreed to the Category 1 Plan.   Exercise goals: All adults should avoid inactivity. Some physical activity is better than none, and adults who participate in any amount of physical activity gain some health benefits. Adults should also include muscle-strengthening activities that involve all major muscle groups on 2 or more days a week.  Behavioral modification strategies: increasing lean protein intake, decreasing simple carbohydrates, increasing vegetables, increasing water intake, no skipping meals, meal planning and cooking strategies, keeping healthy foods in the home, ways to avoid boredom eating, ways to avoid night time snacking, better snacking choices, and planning for success.  Tonya Nicholson has agreed to follow-up with our clinic in 4 weeks. She was informed of the importance of frequent follow-up visits to maximize her success with intensive lifestyle modifications for her multiple health conditions.   Objective:   Blood pressure 116/73, pulse (!) 53, temperature 97.6 F (36.4 C), height 5' 7 (1.702 m), weight 251 lb (113.9 kg), last menstrual period 04/09/2016, SpO2 99%. Body mass index is 39.31 kg/m.  General: Cooperative, alert, well  developed, in no acute distress. HEENT: Conjunctivae and lids unremarkable. Cardiovascular: Regular rhythm.  Lungs: Normal work of breathing. Neurologic: No focal deficits.   Lab Results  Component Value Date   CREATININE 0.94 01/13/2023   BUN 16 01/13/2023   NA 140 01/13/2023   K 4.6 01/13/2023   CL 105 01/13/2023   CO2 23 01/13/2023   Lab Results  Component Value Date   ALT 17 01/13/2023   AST 17 01/13/2023   ALKPHOS 87 01/13/2023   BILITOT 0.4 01/13/2023   Lab Results  Component Value Date   HGBA1C 5.8 (H) 01/13/2023   HGBA1C 5.1 02/05/2022   HGBA1C 5.5 07/15/2021   HGBA1C 5.2 06/18/2021   HGBA1C 5.3 04/10/2021   Lab Results  Component Value Date   INSULIN  9.5 01/13/2023   INSULIN  4.9 02/05/2022   INSULIN  12.8 04/10/2021   INSULIN  11.0 09/26/2020   INSULIN  9.4 04/25/2020   Lab Results  Component Value Date   TSH 1.410 02/24/2018   Lab Results  Component Value Date  CHOL 161 01/13/2023   HDL 58 01/13/2023   LDLCALC 90 01/13/2023   TRIG 69 01/13/2023   CHOLHDL 2.8 01/13/2023   Lab Results  Component Value Date   VD25OH 34.6 01/13/2023   VD25OH 48.6 02/05/2022   VD25OH 77.7 07/15/2021   Lab Results  Component Value Date   WBC 5.6 07/11/2021   HGB 13.8 07/11/2021   HCT 41.0 07/11/2021   MCV 90 07/11/2021   PLT 284 07/11/2021   No results found for: IRON, TIBC, FERRITIN  Attestation Statements:   Reviewed by clinician on day of visit: allergies, medications, problem list, medical history, surgical history, family history, social history, and previous encounter notes.  I have reviewed the above documentation for accuracy and completeness, and I agree with the above. -  Devynn Hessler d. Jaelin Devincentis, NP-C

## 2023-02-09 ENCOUNTER — Other Ambulatory Visit (HOSPITAL_COMMUNITY): Payer: Self-pay

## 2023-03-04 ENCOUNTER — Ambulatory Visit (INDEPENDENT_AMBULATORY_CARE_PROVIDER_SITE_OTHER): Payer: 59 | Admitting: Adult Health

## 2023-03-18 ENCOUNTER — Other Ambulatory Visit (HOSPITAL_COMMUNITY): Payer: Self-pay

## 2023-03-18 ENCOUNTER — Other Ambulatory Visit: Payer: Self-pay | Admitting: Cardiology

## 2023-03-18 DIAGNOSIS — I4819 Other persistent atrial fibrillation: Secondary | ICD-10-CM

## 2023-03-18 MED ORDER — APIXABAN 5 MG PO TABS
5.0000 mg | ORAL_TABLET | Freq: Two times a day (BID) | ORAL | 0 refills | Status: DC
  Filled 2023-03-18 – 2023-04-15 (×5): qty 180, 90d supply, fill #0

## 2023-03-20 ENCOUNTER — Other Ambulatory Visit (HOSPITAL_COMMUNITY): Payer: Self-pay

## 2023-03-22 ENCOUNTER — Other Ambulatory Visit (HOSPITAL_COMMUNITY): Payer: Self-pay

## 2023-03-23 ENCOUNTER — Encounter (INDEPENDENT_AMBULATORY_CARE_PROVIDER_SITE_OTHER): Payer: Self-pay | Admitting: Adult Health

## 2023-03-23 ENCOUNTER — Other Ambulatory Visit (HOSPITAL_COMMUNITY): Payer: Self-pay

## 2023-03-23 ENCOUNTER — Ambulatory Visit (INDEPENDENT_AMBULATORY_CARE_PROVIDER_SITE_OTHER): Payer: 59 | Admitting: Adult Health

## 2023-03-23 VITALS — BP 111/70 | HR 51 | Temp 97.8°F | Ht 67.0 in | Wt 257.0 lb

## 2023-03-23 DIAGNOSIS — Z6841 Body Mass Index (BMI) 40.0 and over, adult: Secondary | ICD-10-CM | POA: Diagnosis not present

## 2023-03-23 DIAGNOSIS — R7303 Prediabetes: Secondary | ICD-10-CM | POA: Diagnosis not present

## 2023-03-23 DIAGNOSIS — I10 Essential (primary) hypertension: Secondary | ICD-10-CM | POA: Diagnosis not present

## 2023-03-23 DIAGNOSIS — E559 Vitamin D deficiency, unspecified: Secondary | ICD-10-CM | POA: Diagnosis not present

## 2023-03-23 DIAGNOSIS — E669 Obesity, unspecified: Secondary | ICD-10-CM | POA: Diagnosis not present

## 2023-03-23 MED ORDER — METFORMIN HCL 500 MG PO TABS
250.0000 mg | ORAL_TABLET | Freq: Every day | ORAL | 0 refills | Status: DC
  Filled 2023-03-23: qty 15, 30d supply, fill #0
  Filled 2023-04-19: qty 15, 30d supply, fill #1

## 2023-03-23 NOTE — Progress Notes (Signed)
 WEIGHT SUMMARY AND BIOMETRICS  Vitals Temp: 97.8 F (36.6 C) BP: 111/70 Pulse Rate: (!) 51 SpO2: 99 %   Anthropometric Measurements Height: 5\' 7"  (1.702 m) Weight: 257 lb (116.6 kg) BMI (Calculated): 40.24 Weight at Last Visit: 251 lb Weight Lost Since Last Visit: 0 Weight Gained Since Last Visit: 6 lb Starting Weight: 263 lb Total Weight Loss (lbs): 6 lb (2.722 kg)   Body Composition  Body Fat %: 52.9 % Fat Mass (lbs): 136 lbs Muscle Mass (lbs): 115 lbs Visceral Fat Rating : 17   Other Clinical Data Fasting: No Labs: No Today's Visit #: 3    Chief Complaint:   OBESITY Tonya Nicholson is here to discuss her progress with her obesity treatment plan. She is on the the Category 2 Plan and states she is following her eating plan approximately 50 % of the time.  She states she is exercising: NEAT Activities   Interim History:  She has yet to restart her home supply of [facility] 1mg - 4 doses Wegovy 1.7mg - 4 doses Wegovy 2.4mg - 7 doses  She endorses increased cravings She has not been exercising regularly- would like to restart  Of note- her daughter is at [facility] during OV  Subjective:   1. Vitamin D deficiency  Latest Reference Range & Units 07/15/21 16:37 02/05/22 11:12 01/13/23 07:51  Vitamin D, 25-Hydroxy 30.0 - 100.0 ng/mL 77.7 48.6 34.6   She is on weekly Ergocalciferol- denies N/V/Muscle Weakness  2. Essential hypertension BP at goal She denies CP with exertion She is on  amLODipine-benazepril (LOTREL) 5-40 MG capsule  metoprolol tartrate (LOPRESSOR) 25 MG tablet  flecainide (TAMBOCOR) 100 MG tablet  apixaban (ELIQUIS) 5 MG TABS tablet   3. Prediabetes Lab Results  Component Value Date   HGBA1C 5.8 (H) 01/13/2023   HGBA1C 5.1 02/05/2022   HGBA1C 5.5 07/15/2021     Latest Reference Range & Units 01/13/23 07:51  eGFR >59 mL/min/1.73 69   She endorses increased cravings She has not been exercising regularly- would like to restart Discussed  risks/benefits of Metformin therapy   Assessment/Plan:   1. Vitamin D deficiency (Primary) Continue weekly Ergocalciferol- denies need for refill today  2. Essential hypertension Continue amLODipine-benazepril (LOTREL) 5-40 MG capsule  metoprolol tartrate (LOPRESSOR) 25 MG tablet  flecainide (TAMBOCOR) 100 MG tablet  apixaban (ELIQUIS) 5 MG TABS tablet   3. Prediabetes Start metFORMIN (GLUCOPHAGE) 500 MG tablet Take 1/2 tablet (250 mg total) by mouth daily with a full meal - hold at this dose. Dispense: 30 tablet, Refills: 0 of 0 remaining   4. Obesity, current BMI 40.3 Restart Wegovy 1mg - take one subcutaneous injection every 14 days Do not OVEREAT  Tonya Nicholson is not currently in the action stage of change. As such, her goal is to get back to weightloss efforts . She has agreed to the Category 1 Plan.   Exercise goals:  1 x Weekly Gym Session  Behavioral modification strategies: increasing lean protein intake, decreasing simple carbohydrates, increasing vegetables, increasing water intake, decreasing eating out, no skipping meals, meal planning and cooking strategies, keeping healthy foods in the home, ways to avoid boredom eating, ways to avoid night time snacking, planning for success, and decreasing junk food.  Tonya Nicholson has agreed to follow-up with our clinic in 4 weeks. She was informed of the importance of frequent follow-up visits to maximize her success with intensive lifestyle modifications for her multiple health conditions.   Objective:   Blood pressure 111/70, pulse (!) 51, temperature  97.8 F (36.6 C), height 5\' 7"  (1.702 m), weight 257 lb (116.6 kg), last menstrual period 04/09/2016, SpO2 99%. Body mass index is 40.25 kg/m.  General: Cooperative, alert, well developed, in no acute distress. HEENT: Conjunctivae and lids unremarkable. Cardiovascular: Regular rhythm.  Lungs: Normal work of breathing. Neurologic: No focal deficits.   Lab Results  Component Value Date    CREATININE 0.94 01/13/2023   BUN 16 01/13/2023   NA 140 01/13/2023   K 4.6 01/13/2023   CL 105 01/13/2023   CO2 23 01/13/2023   Lab Results  Component Value Date   ALT 17 01/13/2023   AST 17 01/13/2023   ALKPHOS 87 01/13/2023   BILITOT 0.4 01/13/2023   Lab Results  Component Value Date   HGBA1C 5.8 (H) 01/13/2023   HGBA1C 5.1 02/05/2022   HGBA1C 5.5 07/15/2021   HGBA1C 5.2 06/18/2021   HGBA1C 5.3 04/10/2021   Lab Results  Component Value Date   INSULIN 9.5 01/13/2023   INSULIN 4.9 02/05/2022   INSULIN 12.8 04/10/2021   INSULIN 11.0 09/26/2020   INSULIN 9.4 04/25/2020   Lab Results  Component Value Date   TSH 1.410 02/24/2018   Lab Results  Component Value Date   CHOL 161 01/13/2023   HDL 58 01/13/2023   LDLCALC 90 01/13/2023   TRIG 69 01/13/2023   CHOLHDL 2.8 01/13/2023   Lab Results  Component Value Date   VD25OH 34.6 01/13/2023   VD25OH 48.6 02/05/2022   VD25OH 77.7 07/15/2021   Lab Results  Component Value Date   WBC 5.6 07/11/2021   HGB 13.8 07/11/2021   HCT 41.0 07/11/2021   MCV 90 07/11/2021   PLT 284 07/11/2021   No results found for: "IRON", "TIBC", "FERRITIN"   Attestation Statements:   Reviewed by clinician on day of visit: allergies, medications, problem list, medical history, surgical history, family history, social history, and previous encounter notes.  I have reviewed the above documentation for accuracy and completeness, and I agree with the above. -  Emanii Bugbee d. Genesee Nase, NP-C

## 2023-03-25 ENCOUNTER — Other Ambulatory Visit (HOSPITAL_COMMUNITY): Payer: Self-pay

## 2023-03-25 ENCOUNTER — Ambulatory Visit (INDEPENDENT_AMBULATORY_CARE_PROVIDER_SITE_OTHER): Payer: 59 | Admitting: Adult Health

## 2023-04-15 ENCOUNTER — Ambulatory Visit (INDEPENDENT_AMBULATORY_CARE_PROVIDER_SITE_OTHER): Payer: 59 | Admitting: Adult Health

## 2023-04-15 ENCOUNTER — Other Ambulatory Visit (HOSPITAL_COMMUNITY): Payer: Self-pay

## 2023-04-15 ENCOUNTER — Other Ambulatory Visit: Payer: Self-pay

## 2023-04-19 ENCOUNTER — Other Ambulatory Visit (HOSPITAL_COMMUNITY): Payer: Self-pay

## 2023-04-28 ENCOUNTER — Other Ambulatory Visit (HOSPITAL_COMMUNITY): Payer: Self-pay

## 2023-05-06 NOTE — Progress Notes (Unsigned)
  Electrophysiology Office Note:   Date:  05/07/2023  ID:  LAINEY NELSON, DOB [birth year], MRN [id number]  Primary Cardiologist: None Primary Heart Failure: None Electrophysiologist: Will Jorja Loa, MD      History of Present Illness:   Tonya Nicholson is a 63 y.o. female with h/o AF,  HTN, OSA seen today for routine electrophysiology followup.   Since last being seen in our clinic the patient reports she has been doing well. No issues on flecainide / toprol.  One nose bleed that was short lived but no other bleeding issues on Eliquis.    She denies chest pain, palpitations, dyspnea, PND, orthopnea, nausea, vomiting, dizziness, syncope, edema, weight gain, or early satiety.   Review of systems complete and found to be negative unless listed in HPI.   EP Information / Studies Reviewed:    EKG is ordered today. Personal review as below.  EKG Interpretation Date/Time:  Friday May 07 2023 08:07:34 EDT Ventricular Rate:  50 PR Interval:  220 QRS Duration:  96 QT Interval:  462 QTC Calculation: 421 R Axis:   12  Text Interpretation: Sinus bradycardia with 1st degree A-V block Confirmed by Canary Brim ([id number]) on 05/07/2023 8:11:42 AM   Studies:  ECHO 02/2018 > LVEF 60-65%, LA mildly dilated    Arrhythmia / AAD AF > dx 2020 Flecainide > initiated 09/2019  DCCV 08/01/2021     Risk Assessment/Calculations:    CHA2DS2-VASc Score = 2   This indicates a 2.2% annual risk of stroke. The patient's score is based upon: CHF History: 0 HTN History: 1 Diabetes History: 0 Stroke History: 0 Vascular Disease History: 0 Age Score: 0 Gender Score: 1         STOP-Bang Score:          Physical Exam:   VS:  BP 100/70   Pulse (!) 50   Ht 5\' 7"  (1.702 m)   Wt 258 lb (117 kg)   LMP 04/09/2016 (Approximate)   SpO2 96%   BMI 40.41 kg/m    Wt Readings from Last 3 Encounters:  05/07/23 258 lb (117 kg)  03/23/23 257 lb (116.6 kg)  02/04/23 251 lb (113.9 kg)     GEN: adult  female, well nourished, well developed in no acute distress NECK: No JVD; No carotid bruits CARDIAC: Regular rate and rhythm, no murmurs, rubs, gallops RESPIRATORY:  Clear to auscultation without rales, wheezing or rhonchi  ABDOMEN: Soft, non-tender, non-distended EXTREMITIES:  No edema; trace LE edema, No deformity   ASSESSMENT AND PLAN:    Persistent Atrial Fibrillation  CHA2DS2-VASc 2  -EKG with NSR, stable intervals   -continue flecainide 100 mg daily  -metoprolol 25 mg daily   Secondary Hypercoagulable State  -continue Eliquis, dose reviewed and appropriate by age / wt   Hypertension  -well controlled on current regimen    Trace LE Edema  -compression stockings recommended  Follow up with Dr. Elberta Fortis in 6 months  Signed, Canary Brim, NP-C, AGACNP-BC [facility] - Electrophysiology  05/07/2023, 8:25 AM

## 2023-05-07 ENCOUNTER — Encounter: Payer: Self-pay | Admitting: Pulmonary Disease

## 2023-05-07 ENCOUNTER — Other Ambulatory Visit (HOSPITAL_COMMUNITY): Payer: Self-pay

## 2023-05-07 ENCOUNTER — Ambulatory Visit: Payer: Commercial Managed Care - PPO | Attending: Cardiology | Admitting: Pulmonary Disease

## 2023-05-07 VITALS — BP 100/70 | HR 50 | Ht 67.0 in | Wt 258.0 lb

## 2023-05-07 DIAGNOSIS — I4819 Other persistent atrial fibrillation: Secondary | ICD-10-CM | POA: Diagnosis not present

## 2023-05-07 DIAGNOSIS — G4733 Obstructive sleep apnea (adult) (pediatric): Secondary | ICD-10-CM | POA: Diagnosis not present

## 2023-05-07 DIAGNOSIS — Z79899 Other long term (current) drug therapy: Secondary | ICD-10-CM

## 2023-05-07 DIAGNOSIS — Z5181 Encounter for therapeutic drug level monitoring: Secondary | ICD-10-CM

## 2023-05-07 DIAGNOSIS — D6869 Other thrombophilia: Secondary | ICD-10-CM | POA: Diagnosis not present

## 2023-05-07 DIAGNOSIS — I10 Essential (primary) hypertension: Secondary | ICD-10-CM

## 2023-05-07 MED ORDER — APIXABAN 5 MG PO TABS
5.0000 mg | ORAL_TABLET | Freq: Two times a day (BID) | ORAL | 1 refills | Status: DC
  Filled 2023-05-07 – 2023-07-14 (×2): qty 180, 90d supply, fill #0
  Filled 2023-10-18: qty 180, 90d supply, fill #1

## 2023-05-07 NOTE — Patient Instructions (Signed)
 Medication Instructions:  Your physician recommends that you continue on your current medications as directed. Please refer to the Current Medication list given to you today.  *If you need a refill on your cardiac medications before your next appointment, please call your pharmacy*  Lab Work: None ordered If you have labs (blood work) drawn today and your tests are completely normal, you will receive your results only by: MyChart Message (if you have MyChart) OR A paper copy in the mail If you have any lab test that is abnormal or we need to change your treatment, we will call you to review the results.  Follow-Up: At [facility], you and your health needs are our priority.  As part of our continuing mission to provide you with exceptional heart care, our providers are all part of one team.  This team includes your primary Cardiologist (physician) and Advanced Practice Providers or APPs (Physician Assistants and Nurse Practitioners) who all work together to provide you with the care you need, when you need it.  Your next appointment:   6 month(s)  Provider:   You may see Will Jorja Loa, MD or one of the following Advanced Practice Providers on your designated Care Team:   Francis Dowse, [place] Casimiro Needle "Mardelle Matte" [place], PA-C Canary Brim, NP     1st Floor: - Lobby - Registration  - Pharmacy  - Lab - Cafe  2nd Floor: - PV Lab - Diagnostic Testing (echo, CT, nuclear med)  3rd Floor: - Vacant  4th Floor: - TCTS (cardiothoracic surgery) - AFib Clinic - Structural Heart Clinic - Vascular Surgery  - Vascular Ultrasound  5th Floor: - HeartCare Cardiology (general and EP) - Clinical Pharmacy for coumadin, hypertension, lipid, weight-loss medications, and med management appointments    Valet parking services will be available as well.

## 2023-05-12 ENCOUNTER — Ambulatory Visit (INDEPENDENT_AMBULATORY_CARE_PROVIDER_SITE_OTHER): Admitting: Adult Health

## 2023-05-13 ENCOUNTER — Ambulatory Visit (INDEPENDENT_AMBULATORY_CARE_PROVIDER_SITE_OTHER): Payer: 59 | Admitting: Adult Health

## 2023-05-17 ENCOUNTER — Other Ambulatory Visit (HOSPITAL_COMMUNITY): Payer: Self-pay

## 2023-05-17 ENCOUNTER — Other Ambulatory Visit (INDEPENDENT_AMBULATORY_CARE_PROVIDER_SITE_OTHER): Payer: Self-pay | Admitting: Adult Health

## 2023-05-17 ENCOUNTER — Other Ambulatory Visit: Payer: Self-pay | Admitting: Cardiology

## 2023-05-17 ENCOUNTER — Encounter (HOSPITAL_COMMUNITY): Payer: Self-pay

## 2023-05-19 ENCOUNTER — Other Ambulatory Visit (HOSPITAL_COMMUNITY): Payer: Self-pay

## 2023-05-19 ENCOUNTER — Other Ambulatory Visit: Payer: Self-pay

## 2023-05-19 MED ORDER — FLECAINIDE ACETATE 100 MG PO TABS
100.0000 mg | ORAL_TABLET | Freq: Two times a day (BID) | ORAL | 3 refills | Status: AC
  Filled 2023-05-19: qty 180, 90d supply, fill #0
  Filled 2023-08-16: qty 180, 90d supply, fill #1
  Filled 2023-11-17: qty 180, 90d supply, fill #2
  Filled 2024-02-19: qty 180, 90d supply, fill #0
  Filled 2024-02-19: qty 180, 90d supply, fill #3

## 2023-06-03 ENCOUNTER — Ambulatory Visit (INDEPENDENT_AMBULATORY_CARE_PROVIDER_SITE_OTHER): Admitting: Adult Health

## 2023-06-03 ENCOUNTER — Other Ambulatory Visit (HOSPITAL_COMMUNITY): Payer: Self-pay

## 2023-06-03 ENCOUNTER — Encounter (INDEPENDENT_AMBULATORY_CARE_PROVIDER_SITE_OTHER): Payer: Self-pay | Admitting: Adult Health

## 2023-06-03 VITALS — BP 124/78 | HR 86 | Temp 97.8°F | Ht 67.0 in | Wt 254.0 lb

## 2023-06-03 DIAGNOSIS — Z6839 Body mass index (BMI) 39.0-39.9, adult: Secondary | ICD-10-CM | POA: Diagnosis not present

## 2023-06-03 DIAGNOSIS — I480 Paroxysmal atrial fibrillation: Secondary | ICD-10-CM | POA: Diagnosis not present

## 2023-06-03 DIAGNOSIS — E669 Obesity, unspecified: Secondary | ICD-10-CM

## 2023-06-03 DIAGNOSIS — I10 Essential (primary) hypertension: Secondary | ICD-10-CM | POA: Diagnosis not present

## 2023-06-03 DIAGNOSIS — R7303 Prediabetes: Secondary | ICD-10-CM

## 2023-06-03 DIAGNOSIS — E559 Vitamin D deficiency, unspecified: Secondary | ICD-10-CM

## 2023-06-03 MED ORDER — METFORMIN HCL 500 MG PO TABS
250.0000 mg | ORAL_TABLET | Freq: Every day | ORAL | 0 refills | Status: DC
  Filled 2023-06-03: qty 30, 60d supply, fill #0

## 2023-06-03 MED ORDER — VITAMIN D (ERGOCALCIFEROL) 1.25 MG (50000 UNIT) PO CAPS
50000.0000 [IU] | ORAL_CAPSULE | ORAL | 0 refills | Status: DC
  Filled 2023-06-03: qty 8, 56d supply, fill #0

## 2023-06-03 NOTE — Progress Notes (Signed)
 WEIGHT SUMMARY AND BIOMETRICS  Vitals Temp: 97.8 F (36.6 C) BP: 124/78 Pulse Rate: 86 SpO2: 97 %   Anthropometric Measurements Height: 5\' 7"  (1.702 m) Weight: 254 lb (115.2 kg) BMI (Calculated): 39.77 Weight at Last Visit: 257 lb Weight Lost Since Last Visit: 3 lb Weight Gained Since Last Visit: 0 Starting Weight: 263 lb Total Weight Loss (lbs): 9 lb (4.082 kg)   Body Composition  Body Fat %: 51.3 % Fat Mass (lbs): 130.4 lbs Muscle Mass (lbs): 117.6 lbs Visceral Fat Rating : 16   Other Clinical Data Fasting: yes Labs: no Today's Visit #: 60 Starting Date: 02/24/18    Chief Complaint:   OBESITY Tonya Nicholson is here to discuss her progress with her obesity treatment plan.  She is on the the Category 2 Plan and states she is following her eating plan approximately 70 % of the time.  She states she is exercising Walking 30-40 minutes 5-7 times per week.  Interim History:  Reviewed Bioempedence results with pt: Muscle Mass: +2.6 lbs Adipose Mass: -5.6 lbs  She was started on Metformin  at last HWW OV, she has been taking 500mg  1/2 tab daily  She held Metformin  while at the beach for a [place]. She will resume today When taking- she reports tolerating well  Subjective:   1. PAF (paroxysmal atrial fibrillation) (HCC) 05/07/2023 Cards OV Notes  History of Present Illness:   Tonya Nicholson is a 63 y.o. female with h/o AF,  HTN, OSA seen today for routine electrophysiology followup.    Since last being seen in our clinic the patient reports she has been doing well. No issues on flecainide  / toprol .  One nose bleed that was short lived but no other bleeding issues on Eliquis .    She denies chest pain, palpitations, dyspnea, PND, orthopnea, nausea, vomiting, dizziness, syncope, edema, weight gain, or early satiety.    Review of systems complete and found to be negative unless listed in HPI. ASSESSMENT AND PLAN:     Persistent Atrial Fibrillation  CHA2DS2-VASc 2   -EKG with NSR, stable intervals   -continue flecainide  100 mg daily  -metoprolol  25 mg daily    Secondary Hypercoagulable State  -continue Eliquis , dose reviewed and appropriate by age / wt    Hypertension  -well controlled on current regimen     Trace LE Edema  -compression stockings recommended   Follow up with Dr. Lawana Pray in 6 months  2. Prediabetes Lab Results  Component Value Date   HGBA1C 5.8 (H) 01/13/2023   HGBA1C 5.1 02/05/2022   HGBA1C 5.5 07/15/2021    03/23/2023- started on Metformin  500mg  1/2 tab daily She held Metformin  while at the beach for a week. She will resume today When taking- she reports tolerating well  3. Essential hypertension BP at goal at OV She denies CP with exertion She has been increasing daily activity and walking more frequently!  4. Vitamin D  deficiency  Latest Reference Range & Units 01/13/23 07:51  Vitamin D , 25-Hydroxy 30.0 - 100.0 ng/mL 34.6   She endorses stable energy levels  Assessment/Plan:   1. PAF (paroxysmal atrial fibrillation) (HCC) (Primary) Continue current medication regime per Cards  2. Prediabetes Check Labs at next OV Refill metFORMIN  (GLUCOPHAGE ) 500 MG tablet Take 1/2 tablet (250 mg total) by mouth daily with a full meal - hold at this dose. Dispense: 30 tablet, Refills: 0 of 0 remaining   3. Essential hypertension Check Labs at next OV Continue current medication regime  per Cards  4. Vitamin D  deficiency Check Labs at next OV Refill Vitamin D , Ergocalciferol , (DRISDOL ) 1.25 MG (50000 UNIT) CAPS capsule Take 1 capsule (50,000 Units total) by mouth every 7 (seven) days. Dispense: 8 capsule, Refills: 0 ordered   5. Obesity, current BMI 39.8  [place] is currently in the action stage of change. As such, her goal is to continue with weight loss efforts. She has agreed to the Category 2 Plan.   Exercise goals: For substantial health benefits, adults should do at least 150 minutes (2 hours and 30 minutes) a  week of moderate-intensity, or 75 minutes (1 hour and 15 minutes) a week of vigorous-intensity aerobic physical activity, or an equivalent combination of moderate- and vigorous-intensity aerobic activity. Aerobic activity should be performed in episodes of at least 10 minutes, and preferably, it should be spread throughout the week.  Behavioral modification strategies: increasing lean protein intake, decreasing simple carbohydrates, increasing vegetables, increasing water intake, no skipping meals, meal planning and cooking strategies, keeping healthy foods in the home, and planning for success.  [name] has agreed to follow-up with our clinic in 4 weeks. She was informed of the importance of frequent follow-up visits to maximize her success with intensive lifestyle modifications for her multiple health conditions.   Check Fasting Labs at next OV  Objective:   Blood pressure 124/78, pulse 86, temperature 97.8 F (36.6 C), height 5\' 7"  (1.702 m), weight 254 lb (115.2 kg), last menstrual period 04/09/2016, SpO2 97%. Body mass index is 39.78 kg/m.  General: Cooperative, alert, well developed, in no acute distress. HEENT: Conjunctivae and lids unremarkable. Cardiovascular: Regular rhythm.  Lungs: Normal work of breathing. Neurologic: No focal deficits.   Lab Results  Component Value Date   CREATININE 0.94 01/13/2023   BUN 16 01/13/2023   NA 140 01/13/2023   K 4.6 01/13/2023   CL 105 01/13/2023   CO2 23 01/13/2023   Lab Results  Component Value Date   ALT 17 01/13/2023   AST 17 01/13/2023   ALKPHOS 87 01/13/2023   BILITOT 0.4 01/13/2023   Lab Results  Component Value Date   HGBA1C 5.8 (H) 01/13/2023   HGBA1C 5.1 02/05/2022   HGBA1C 5.5 07/15/2021   HGBA1C 5.2 06/18/2021   HGBA1C 5.3 04/10/2021   Lab Results  Component Value Date   INSULIN  9.5 01/13/2023   INSULIN  4.9 02/05/2022   INSULIN  12.8 04/10/2021   INSULIN  11.0 09/26/2020   INSULIN  9.4 04/25/2020   Lab Results   Component Value Date   TSH 1.410 02/24/2018   Lab Results  Component Value Date   CHOL 161 01/13/2023   HDL 58 01/13/2023   LDLCALC 90 01/13/2023   TRIG 69 01/13/2023   CHOLHDL 2.8 01/13/2023   Lab Results  Component Value Date   VD25OH 34.6 01/13/2023   VD25OH 48.6 02/05/2022   VD25OH 77.7 07/15/2021   Lab Results  Component Value Date   WBC 5.6 07/11/2021   HGB 13.8 07/11/2021   HCT 41.0 07/11/2021   MCV 90 07/11/2021   PLT 284 07/11/2021   No results found for: "IRON", "TIBC", "FERRITIN"  Attestation Statements:   Reviewed by clinician on day of visit: allergies, medications, problem list, medical history, surgical history, family history, social history, and previous encounter notes.  I have reviewed the above documentation for accuracy and completeness, and I agree with the above. -  Daeshaun Specht d. Elzena Muston, NP-C

## 2023-06-24 ENCOUNTER — Encounter (INDEPENDENT_AMBULATORY_CARE_PROVIDER_SITE_OTHER): Payer: Self-pay | Admitting: Adult Health

## 2023-06-24 ENCOUNTER — Other Ambulatory Visit (HOSPITAL_COMMUNITY): Payer: Self-pay

## 2023-06-24 ENCOUNTER — Ambulatory Visit (INDEPENDENT_AMBULATORY_CARE_PROVIDER_SITE_OTHER): Admitting: Adult Health

## 2023-06-24 VITALS — BP 112/71 | HR 59 | Temp 98.2°F | Ht 67.0 in | Wt 258.0 lb

## 2023-06-24 DIAGNOSIS — E559 Vitamin D deficiency, unspecified: Secondary | ICD-10-CM

## 2023-06-24 DIAGNOSIS — E669 Obesity, unspecified: Secondary | ICD-10-CM | POA: Diagnosis not present

## 2023-06-24 DIAGNOSIS — I10 Essential (primary) hypertension: Secondary | ICD-10-CM | POA: Diagnosis not present

## 2023-06-24 DIAGNOSIS — Z6841 Body Mass Index (BMI) 40.0 and over, adult: Secondary | ICD-10-CM | POA: Diagnosis not present

## 2023-06-24 DIAGNOSIS — I480 Paroxysmal atrial fibrillation: Secondary | ICD-10-CM | POA: Diagnosis not present

## 2023-06-24 DIAGNOSIS — R7303 Prediabetes: Secondary | ICD-10-CM | POA: Diagnosis not present

## 2023-06-24 MED ORDER — VITAMIN D (ERGOCALCIFEROL) 1.25 MG (50000 UNIT) PO CAPS
50000.0000 [IU] | ORAL_CAPSULE | ORAL | 0 refills | Status: DC
Start: 2023-06-24 — End: 2023-11-16
  Filled 2023-06-24 – 2023-08-16 (×2): qty 8, 56d supply, fill #0

## 2023-06-24 MED ORDER — METFORMIN HCL 500 MG PO TABS
500.0000 mg | ORAL_TABLET | Freq: Every day | ORAL | 0 refills | Status: DC
  Filled 2023-06-24: qty 30, fill #0
  Filled 2023-06-29: qty 30, 30d supply, fill #0

## 2023-06-24 NOTE — Progress Notes (Signed)
 WEIGHT SUMMARY AND BIOMETRICS  Vitals Temp: 98.2 F (36.8 C) BP: 112/71 Pulse Rate: (!) 59 SpO2: 98 %   Anthropometric Measurements Height: 5\' 7"  (1.702 m) Weight: 258 lb (117 kg) BMI (Calculated): 40.4 Weight at Last Visit: 254 lb Weight Lost Since Last Visit: 0 Weight Gained Since Last Visit: 4 lb Starting Weight: 263 lb Total Weight Loss (lbs): 5 lb (2.268 kg)   Body Composition  Body Fat %: 51.3 % Fat Mass (lbs): 132.4 lbs Muscle Mass (lbs): 119.4 lbs Visceral Fat Rating : 17   Other Clinical Data Fasting: no Labs: no Today's Visit #: 49 Starting Date: 02/24/18    Chief Complaint:   OBESITY Tonya Nicholson is here to discuss her progress with her obesity treatment plan.  She is on the the Category 2 Plan and states she is following her eating plan approximately 75 % of the time.  She states she is exercising Treadmill/Bike 30/15 minutes 3/3 times per week.  Interim History:  She has significantly increased her exercise duration/time- great job!  Reviewed Bioimpedance Results with pt: Muscle Mass: +1.8 lbs Adipose Mass: +2 lbs  She continues to work frequent overtime and assist with her daughter and her new granddaughter.  Upcoming Trips: September: [place] November: [place]   Subjective:   1. Vitamin D  deficiency  Latest Reference Range & Units 07/15/21 16:37 02/05/22 11:12 01/13/23 07:51  Vitamin D , 25-Hydroxy 30.0 - 100.0 ng/mL 77.7 48.6 34.6   She is on weekly Ergocalciferol - denies N/V/Muscle Weakness  2. PAF (paroxysmal atrial fibrillation) (HCC)  She denies recent palpitations  05/07/2023 Cards OV Notes   History of Present Illness:   Tonya Nicholson is a 63 y.o. female with h/o AF,  HTN, OSA seen today for routine electrophysiology followup.    Since last being seen in our clinic the patient reports she has been doing well. No issues on flecainide  / toprol .  One nose bleed that was short lived but no other bleeding issues on  Eliquis .    She denies chest pain, palpitations, dyspnea, PND, orthopnea, nausea, vomiting, dizziness, syncope, edema, weight gain, or early satiety.    Review of systems complete and found to be negative unless listed in HPI. ASSESSMENT AND PLAN:     Persistent Atrial Fibrillation  CHA2DS2-VASc 2  -EKG with NSR, stable intervals   -continue flecainide  100 mg daily  -metoprolol  25 mg daily    Secondary Hypercoagulable State  -continue Eliquis , dose reviewed and appropriate by age / wt    Hypertension  -well controlled on current regimen     Trace LE Edema  -compression stockings recommended   Follow up with Dr. Lawana Pray in 6 months 3. Prediabetes Lab Results  Component Value Date   HGBA1C 5.8 (H) 01/13/2023   HGBA1C 5.1 02/05/2022   HGBA1C 5.5 07/15/2021    03/23/2023- started on Metformin  500mg  1/2 tab daily  Remained on this dose, denies GI upset Agreeable to increase to 1 full tab  4. Essential hypertension BP excellent and at goal at OV She denies CP with exertion She is currently on  amLODipine -benazepril  (LOTREL ) 5-40 MG capsule  metoprolol  tartrate (LOPRESSOR ) 25 MG tablet  apixaban  (ELIQUIS ) 5 MG TABS tablet  flecainide  (TAMBOCOR ) 100 MG tablet   Assessment/Plan:   1. Vitamin D  deficiency Refill Vitamin D , Ergocalciferol , (DRISDOL ) 1.25 MG (50000 UNIT) CAPS capsule Take 1 capsule (50,000 Units total) by mouth every 7 (seven) days. Dispense: 8 capsule, Refills: 0 of 0 remaining  2. PAF (paroxysmal atrial fibrillation) (HCC) (Primary) Reduce stress Continue regular exercise  3. Prediabetes Refill and INCREASE  metFORMIN  (GLUCOPHAGE ) 500 MG tablet Take 1 tablet (500 mg total) by mouth daily with a meal. Dispense: 30 tablet, Refills: 0 of 0 remaining   4. Essential hypertension Continue amLODipine -benazepril  (LOTREL ) 5-40 MG capsule  metoprolol  tartrate (LOPRESSOR ) 25 MG tablet  apixaban  (ELIQUIS ) 5 MG TABS tablet  flecainide  (TAMBOCOR ) 100 MG tablet    5. Starting Obesity 40.4  Tonya Nicholson is currently in the action stage of change. As such, her goal is to continue with weight loss efforts. She has agreed to the Category 2 Plan.   Exercise goals: All adults should avoid inactivity. Some physical activity is better than none, and adults who participate in any amount of physical activity gain some health benefits. Adults should also include muscle-strengthening activities that involve all major muscle groups on 2 or more days a week.  Behavioral modification strategies: increasing lean protein intake, decreasing simple carbohydrates, increasing vegetables, increasing water intake, no skipping meals, meal planning and cooking strategies, keeping healthy foods in the home, ways to avoid boredom eating, and planning for success.  Lakeitha has agreed to follow-up with our clinic in 4 weeks. She was informed of the importance of frequent follow-up visits to maximize her success with intensive lifestyle modifications for her multiple health conditions.   Check Fasting Labs at next OV  Objective:   Blood pressure 112/71, pulse (!) 59, temperature 98.2 F (36.8 C), height 5\' 7"  (1.702 m), weight 258 lb (117 kg), last menstrual period 04/09/2016, SpO2 98%. Body mass index is 40.41 kg/m.  General: Cooperative, alert, well developed, in no acute distress. HEENT: Conjunctivae and lids unremarkable. Cardiovascular: Regular rhythm.  Lungs: Normal work of breathing. Neurologic: No focal deficits.   Lab Results  Component Value Date   CREATININE 0.94 01/13/2023   BUN 16 01/13/2023   NA 140 01/13/2023   K 4.6 01/13/2023   CL 105 01/13/2023   CO2 23 01/13/2023   Lab Results  Component Value Date   ALT 17 01/13/2023   AST 17 01/13/2023   ALKPHOS 87 01/13/2023   BILITOT 0.4 01/13/2023   Lab Results  Component Value Date   HGBA1C 5.8 (H) 01/13/2023   HGBA1C 5.1 02/05/2022   HGBA1C 5.5 07/15/2021   HGBA1C 5.2 06/18/2021   HGBA1C 5.3 04/10/2021    Lab Results  Component Value Date   INSULIN  9.5 01/13/2023   INSULIN  4.9 02/05/2022   INSULIN  12.8 04/10/2021   INSULIN  11.0 09/26/2020   INSULIN  9.4 04/25/2020   Lab Results  Component Value Date   TSH 1.410 02/24/2018   Lab Results  Component Value Date   CHOL 161 01/13/2023   HDL 58 01/13/2023   LDLCALC 90 01/13/2023   TRIG 69 01/13/2023   CHOLHDL 2.8 01/13/2023   Lab Results  Component Value Date   VD25OH 34.6 01/13/2023   VD25OH 48.6 02/05/2022   VD25OH 77.7 07/15/2021   Lab Results  Component Value Date   WBC 5.6 07/11/2021   HGB 13.8 07/11/2021   HCT 41.0 07/11/2021   MCV 90 07/11/2021   PLT 284 07/11/2021   No results found for: "IRON", "TIBC", "FERRITIN"  Attestation Statements:   Reviewed by clinician on day of visit: allergies, medications, problem list, medical history, surgical history, family history, social history, and previous encounter notes.  I have reviewed the above documentation for accuracy and completeness, and I agree with the above. Acie Holiday  d. Darlyn Eke, NP-C

## 2023-06-29 ENCOUNTER — Other Ambulatory Visit (HOSPITAL_COMMUNITY): Payer: Self-pay

## 2023-07-01 ENCOUNTER — Telehealth: Admitting: Physician Assistant

## 2023-07-01 ENCOUNTER — Other Ambulatory Visit (HOSPITAL_COMMUNITY): Payer: Self-pay

## 2023-07-01 DIAGNOSIS — R6889 Other general symptoms and signs: Secondary | ICD-10-CM

## 2023-07-01 MED ORDER — BENZONATATE 100 MG PO CAPS
100.0000 mg | ORAL_CAPSULE | Freq: Two times a day (BID) | ORAL | 0 refills | Status: DC | PRN
  Filled 2023-07-01: qty 20, 10d supply, fill #0

## 2023-07-01 NOTE — Progress Notes (Signed)
 E visit for Flu like symptoms   We are sorry that you are not feeling well.  Here is how we plan to help! Based on what you have shared with me it looks like you may have flu-like symptoms that should be watched but do not seem to indicate anti-viral treatment.  Influenza or "the flu" is   an infection caused by a respiratory virus. The flu virus is highly contagious and persons who did not receive their yearly flu vaccination may "catch" the flu from close contact.  We have anti-viral medications to treat the viruses that cause this infection. They are not a "cure" and only shorten the course of the infection. These prescriptions are most effective when they are given within the first 2 days of "flu" symptoms. Antiviral medication are indicated if you have a high risk of complications from the flu. You should  also consider an antiviral medication if you are in close contact with someone who is at risk. These medications can help patients avoid complications from the flu  but have side effects that you should know. Possible side effects from Tamiflu or oseltamivir include nausea, vomiting, diarrhea, dizziness, headaches, eye redness, sleep problems or other respiratory symptoms. You should not take Tamiflu if you have an allergy to oseltamivir or any to the ingredients in Tamiflu.  Based upon your symptoms and potential risk factors I recommend that you follow the flu symptoms recommendation that I have listed below.  ANYONE WHO HAS FLU SYMPTOMS SHOULD: . Stay home. The flu is highly contagious and going out or to work exposes others! . Be sure to drink plenty of fluids. Water is fine as well as fruit juices, sodas and electrolyte beverages. You may want to stay away from caffeine or alcohol. If you are nauseated, try taking small sips of liquids. How do you know if you are getting enough fluid? Your urine should be a pale yellow or almost colorless. . Get rest. . Taking a steamy shower or using a  humidifier may help nasal congestion and ease sore throat pain. Using a saline nasal spray works much the same way. . Cough drops, hard candies and sore throat lozenges may ease your cough. . Line up a caregiver. Have someone check on you regularly.   GET HELP RIGHT AWAY IF: . You cannot keep down liquids or your medications. . You become short of breath . Your fell like you are going to pass out or loose consciousness. . Your symptoms persist after you have completed your treatment plan MAKE SURE YOU   Understand these instructions.  Will watch your condition.  Will get help right away if you are not doing well or get worse.  Your e-visit answers were reviewed by a board certified advanced clinical practitioner to complete your personal care plan.  Depending on the condition, your plan could have included both over the counter or prescription medications.  If there is a problem please reply  once you have received a response from your provider.  Your safety is important to [place].  If you have drug allergies check your prescription carefully.    You can use MyChart to ask questions about today's visit, request a non-urgent call back, or ask for a work or school excuse for 24 hours related to this e-Visit. If it has been greater than 24 hours you will need to follow up with your provider, or enter a new e-Visit to address those concerns.  You will get an e-mail  in the next two days asking about your experience.  I hope that your e-visit has been valuable and will speed your recovery. Thank you for using e-visits.

## 2023-07-01 NOTE — Progress Notes (Signed)
 I have spent 5 minutes in review of e-visit questionnaire, review and updating patient chart, medical decision making and response to patient.   Laure Kidney, PA-C

## 2023-07-05 ENCOUNTER — Ambulatory Visit
Admission: RE | Admit: 2023-07-05 | Discharge: 2023-07-05 | Disposition: A | Discharge status: Home / Self Care | Source: Ambulatory Visit | Attending: Family Medicine | Admitting: Family Medicine

## 2023-07-05 ENCOUNTER — Other Ambulatory Visit (HOSPITAL_COMMUNITY): Payer: Self-pay

## 2023-07-05 VITALS — BP 114/69 | HR 47 | Temp 99.4°F | Resp 16

## 2023-07-05 DIAGNOSIS — R059 Cough, unspecified: Secondary | ICD-10-CM

## 2023-07-05 DIAGNOSIS — J069 Acute upper respiratory infection, unspecified: Secondary | ICD-10-CM

## 2023-07-05 MED ORDER — PREDNISONE 20 MG PO TABS
60.0000 mg | ORAL_TABLET | Freq: Every day | ORAL | 0 refills | Status: DC
  Filled 2023-07-05: qty 15, 5d supply, fill #0

## 2023-07-05 MED ORDER — AMOXICILLIN-POT CLAVULANATE 875-125 MG PO TABS
1.0000 | ORAL_TABLET | Freq: Two times a day (BID) | ORAL | 0 refills | Status: AC
  Filled 2023-07-05: qty 14, 7d supply, fill #0

## 2023-07-05 NOTE — ED Triage Notes (Signed)
 Pt presents to [facility] with sore throat, congestion, facial pressure, eye redness for 3 days. Pt reports she has been seen on Friday for same symptoms with evisit and was given  cough medications. And has been taking nyquill and dayquill.

## 2023-07-05 NOTE — Discharge Instructions (Addendum)
 Advised patient to take medications as directed with food to completion.  Advised patient to take prednisone with first dose of Augmentin for the next 5 of 7 days.  Encouraged to increase daily water intake to 64 ounces per day while taking these medications.  Advised if symptoms worsen and/or unresolved please follow-up with your PCP or here for further evaluation.

## 2023-07-05 NOTE — ED Provider Notes (Signed)
 [facility]    CSN: [id number] Arrival date & time: 07/05/23  1336      History   Chief Complaint Chief Complaint  Patient presents with   Cough    Congestion. Some fever - Entered by patient    HPI CARLITA WHITCOMB is a 63 y.o. female.   HPI 63 year old female presents with cough and congestion, sore throat and eye redness for 3 days..  Patient reports was evaluated at a ED visit on 07/01/2023 and prescribed cough medication.  PMH significant for morbid/severe obesity, A-fib, and HTN.  Past Medical History:  Diagnosis Date   Atrial fibrillation (HCC)    Dyspnea    GERD (gastroesophageal reflux disease)    Hypertension    Joint pain    Knee pain    Lower extremity edema    Sleep apnea    uses cpap    Patient Active Problem List   Diagnosis Date Noted   SOB (shortness of breath) on exertion 11/26/2021   Long term current use of anticoagulant therapy 11/20/2021   PAF (paroxysmal atrial fibrillation) (HCC) 09/02/2021   At risk for constipation 07/15/2021   S/P cholecystectomy 07/03/2021   Atrial fibrillation, chronic (HCC) 06/18/2021   GERD without esophagitis 06/18/2021   Acute cholecystitis 06/17/2021   Metabolic syndrome 06/13/2020   Vitamin D  deficiency 07/13/2019   Prediabetes 07/13/2019   Depression 03/09/2019   Health care maintenance 04/30/2016   Family history of diabetes mellitus 04/30/2016   Screen for colon cancer 04/30/2016   Other fatigue 04/30/2016   Essential hypertension 04/30/2016   Class 3 severe obesity with serious comorbidity and body mass index (BMI) of 40.0 to 44.9 in adult 04/30/2016   Bilateral arm pain 04/30/2016    Past Surgical History:  Procedure Laterality Date   CARDIOVERSION N/A 03/21/2018   Procedure: CARDIOVERSION;  Surgeon: Jacqueline Matsu, MD;  Location[facility] ENDOSCOPY;  Service: Cardiovascular;  Laterality: N/A;   CARDIOVERSION N/A 03/31/2018   Procedure: CARDIOVERSION;  Surgeon: Lei Pump, MD;   Location: [facility] ENDOSCOPY;  Service: Cardiovascular;  Laterality: N/A;   CARDIOVERSION N/A 10/06/2019   Procedure: CARDIOVERSION;  Surgeon: Lei Pump, MD;  Location: [facility] ENDOSCOPY;  Service: Cardiovascular;  Laterality: N/A;   CARDIOVERSION N/A 08/01/2021   Procedure: CARDIOVERSION;  Surgeon: Lei Pump, MD;  Location: [facility] ENDOSCOPY;  Service: Cardiovascular;  Laterality: N/A;   CHOLECYSTECTOMY N/A 06/19/2021   Procedure: LAPAROSCOPIC CHOLECYSTECTOMY;  Surgeon: Adalberto Acton, MD;  Location: [facility] OR;  Service: General;  Laterality: N/A;   COLONOSCOPY  12/05/2021    OB History     Gravida  1   Para      Term      Preterm      AB      Living  1      SAB      IAB      Ectopic      Multiple      Live Births               Home Medications    Prior to Admission medications   Medication Sig Start Date End Date Taking? Authorizing Provider  amoxicillin -clavulanate (AUGMENTIN) 875-125 MG tablet Take 1 tablet by mouth every 12 (twelve) hours for 7 days. 07/05/23 07/12/23 Yes Leonides Ramp, FNP  predniSONE (DELTASONE) 20 MG tablet Take 3 tablets (60 mg total) by mouth daily for 5 days. 07/05/23  Yes Allicia Culley, FNP  amLODipine -benazepril  (LOTREL ) 5-40 MG capsule Take  1 capsule by mouth daily. 12/18/22     amLODipine -benazepril  (LOTREL ) 5-40 MG capsule Take 1 capsule by mouth daily. 12/18/22     apixaban  (ELIQUIS ) 5 MG TABS tablet Take 1 tablet (5 mg total) by mouth 2 (two) times daily. 05/07/23   Thomasena Fleming, NP  benzonatate  (TESSALON ) 100 MG capsule Take 1 capsule (100 mg total) by mouth 2 (two) times daily as needed for cough. 07/01/23   Marciana Settle, PA-C  flecainide  (TAMBOCOR ) 100 MG tablet Take 1 tablet (100 mg total) by mouth 2 (two) times daily. 05/19/23   Camnitz, Babetta Lesch, MD  metFORMIN  (GLUCOPHAGE ) 500 MG tablet Take 1 tablet (500 mg total) by mouth daily with a meal. 06/24/23   Danford, Acie Holiday D, NP  metoprolol  tartrate (LOPRESSOR ) 25 MG tablet  Take 1 tablet (25 mg total) by mouth 2 (two) times daily. 01/01/23   Camnitz, Babetta Lesch, MD  valACYclovir (VALTREX) 500 MG tablet Take 1 tablet by mouth daily. 12/29/15   [provider]  Vitamin D , Ergocalciferol , (DRISDOL ) 1.25 MG (50000 UNIT) CAPS capsule Take 1 capsule (50,000 Units total) by mouth every 7 (seven) days. 06/24/23   DanfordBarkley Li, NP    Family History Family History  Problem Relation Age of Onset   Diabetes Mother    Hypertension Mother    Atrial fibrillation Mother    Diabetes Father    Hypertension Father    Heart disease Father        multiple PCI & CABGx3   Bladder Cancer Father    Healthy Sister    Diabetes Brother    Heart attack Brother        died in 07-28-2016   Colon polyps Neg Hx    Stomach cancer Neg Hx    Esophageal cancer Neg Hx     Social History Social History   Tobacco Use   Smoking status: Never   Smokeless tobacco: Never  Vaping Use   Vaping status: Never Used  Substance Use Topics   Alcohol use: Not Currently    Comment: 2 drinks per week   Drug use: No     Allergies   Patient has no known allergies.   Review of Systems Review of Systems   Physical Exam Triage Vital Signs ED Triage Vitals  Encounter Vitals Group     BP      Systolic BP Percentile      Diastolic BP Percentile      Pulse      Resp      Temp      Temp src      SpO2      Weight      Height      Head Circumference      Peak Flow      Pain Score      Pain Loc      Pain Education      Exclude from Growth Chart    No data found.  Updated Vital Signs BP 114/69   Pulse (!) 47   Temp 99.4 F (37.4 C)   Resp 16   LMP 04/09/2016 (Approximate)   SpO2 98%   Visual Acuity Right Eye Distance:   Left Eye Distance:   Bilateral Distance:    Right Eye Near:   Left Eye Near:    Bilateral Near:     Physical Exam Vitals and nursing note reviewed.  Constitutional:      Appearance: Normal appearance. She is obese. She  is ill-appearing.   HENT:     Head: Normocephalic and atraumatic.     Right Ear: Tympanic membrane, ear canal and external ear normal.     Left Ear: Tympanic membrane, ear canal and external ear normal.     Mouth/Throat:     Mouth: Mucous membranes are moist.     Pharynx: Oropharynx is clear.     Comments: Moderate amount of clear drainage of posterior oropharynx noted Eyes:     Extraocular Movements: Extraocular movements intact.     Conjunctiva/sclera: Conjunctivae normal.     Pupils: Pupils are equal, round, and reactive to light.  Cardiovascular:     Rate and Rhythm: Normal rate and regular rhythm.     Pulses: Normal pulses.     Heart sounds: Normal heart sounds.  Pulmonary:     Effort: Pulmonary effort is normal.     Breath sounds: Normal breath sounds. No wheezing, rhonchi or rales.     Comments: Infrequent nonproductive cough on exam Musculoskeletal:        General: Normal range of motion.     Cervical back: Normal range of motion and neck supple.  Skin:    General: Skin is warm and dry.  Neurological:     General: No focal deficit present.     Mental Status: She is alert and oriented to person, place, and time. Mental status is at baseline.  Psychiatric:        Mood and Affect: Mood normal.        Behavior: Behavior normal.      UC Treatments / Results  Labs (all labs ordered are listed, but only abnormal results are displayed) Labs Reviewed - No data to display  EKG   Radiology No results found.  Procedures Procedures (including critical care time)  Medications Ordered in UC Medications - No data to display  Initial Impression / Assessment and Plan / UC Course  I have reviewed the triage vital signs and the nursing notes.  Pertinent labs & imaging results that were available during my care of the patient were reviewed by me and considered in my medical decision making (see chart for details).     MDM: 1.  Acute upper respiratory infection-Rx'd Augmentin 875/225 mg  tablet: Take 1 tablet twice daily x 7 days; 2.  Cough, unspecified type-Rx'd prednisone 20 mg tablet: Take 3 tabs p.o. daily x 5 days. Advised patient to take medications as directed with food to completion.  Advised patient to take prednisone with first dose of Augmentin for the next 5 of 7 days.  Encouraged to increase daily water intake to 64 ounces per day while taking these medications.  Advised if symptoms worsen and/or unresolved please follow-up with your PCP or here for further evaluation.  Patient discharged home, hemodynamically stable. Final Clinical Impressions(s) / UC Diagnoses   Final diagnoses:  Acute upper respiratory infection  Cough, unspecified type     Discharge Instructions      Advised patient to take medications as directed with food to completion.  Advised patient to take prednisone with first dose of Augmentin for the next 5 of 7 days.  Encouraged to increase daily water intake to 64 ounces per day while taking these medications.  Advised if symptoms worsen and/or unresolved please follow-up with your PCP or here for further evaluation.   ED Prescriptions     Medication Sig Dispense Auth. Provider   amoxicillin -clavulanate (AUGMENTIN) 875-125 MG tablet Take 1 tablet by mouth every 12 (  twelve) hours for 7 days. 14 tablet Wynelle Dreier, FNP   predniSONE (DELTASONE) 20 MG tablet Take 3 tablets (60 mg total) by mouth daily for 5 days. 15 tablet Arlando Leisinger, FNP      PDMP not reviewed this encounter.   Leonides Ramp, FNP 07/05/23 1515

## 2023-07-06 ENCOUNTER — Telehealth: Payer: Self-pay | Admitting: Family Medicine

## 2023-07-06 ENCOUNTER — Other Ambulatory Visit (HOSPITAL_COMMUNITY): Payer: Self-pay

## 2023-07-06 MED ORDER — MOXIFLOXACIN HCL 0.5 % OP SOLN
1.0000 [drp] | Freq: Three times a day (TID) | OPHTHALMIC | 0 refills | Status: AC
  Filled 2023-07-06: qty 3, 20d supply, fill #0

## 2023-07-06 NOTE — Telephone Encounter (Signed)
 Patient requires eyedrop for bilateral conjunctivitis worsening since yesterday at office visit.  Vigamox ophthalmic solution sent to patient's pharmacy per her request.

## 2023-07-15 ENCOUNTER — Other Ambulatory Visit (HOSPITAL_COMMUNITY): Payer: Self-pay

## 2023-07-22 ENCOUNTER — Ambulatory Visit (INDEPENDENT_AMBULATORY_CARE_PROVIDER_SITE_OTHER): Admitting: Adult Health

## 2023-08-10 ENCOUNTER — Other Ambulatory Visit (HOSPITAL_COMMUNITY): Payer: Self-pay

## 2023-08-16 ENCOUNTER — Other Ambulatory Visit (HOSPITAL_COMMUNITY): Payer: Self-pay

## 2023-08-19 ENCOUNTER — Ambulatory Visit (INDEPENDENT_AMBULATORY_CARE_PROVIDER_SITE_OTHER): Admitting: Adult Health

## 2023-08-26 ENCOUNTER — Ambulatory Visit (INDEPENDENT_AMBULATORY_CARE_PROVIDER_SITE_OTHER): Admitting: Adult Health

## 2023-09-06 ENCOUNTER — Ambulatory Visit (INDEPENDENT_AMBULATORY_CARE_PROVIDER_SITE_OTHER): Admitting: Adult Health

## 2023-09-06 ENCOUNTER — Encounter (INDEPENDENT_AMBULATORY_CARE_PROVIDER_SITE_OTHER): Payer: Self-pay | Admitting: Adult Health

## 2023-09-06 ENCOUNTER — Other Ambulatory Visit (HOSPITAL_COMMUNITY): Payer: Self-pay

## 2023-09-06 VITALS — BP 115/70 | HR 64 | Temp 98.2°F | Ht 67.0 in | Wt 258.0 lb

## 2023-09-06 DIAGNOSIS — E669 Obesity, unspecified: Secondary | ICD-10-CM | POA: Diagnosis not present

## 2023-09-06 DIAGNOSIS — Z6841 Body Mass Index (BMI) 40.0 and over, adult: Secondary | ICD-10-CM | POA: Diagnosis not present

## 2023-09-06 DIAGNOSIS — I10 Essential (primary) hypertension: Secondary | ICD-10-CM

## 2023-09-06 DIAGNOSIS — R7303 Prediabetes: Secondary | ICD-10-CM

## 2023-09-06 MED ORDER — METFORMIN HCL 500 MG PO TABS
500.0000 mg | ORAL_TABLET | Freq: Every day | ORAL | 0 refills | Status: DC
  Filled 2023-09-06 – 2023-10-18 (×2): qty 30, 30d supply, fill #0

## 2023-09-06 NOTE — Progress Notes (Signed)
 WEIGHT SUMMARY AND BIOMETRICS  Vitals Temp: 98.2 F (36.8 C) BP: 115/70 Pulse Rate: 64 SpO2: 97 %   Anthropometric Measurements Height: 5' 7 (1.702 m) Weight: 258 lb (117 kg) BMI (Calculated): 40.4 Weight at Last Visit: 258lb Weight Lost Since Last Visit: 0lb Weight Gained Since Last Visit: 0lb Starting Weight: 263lb Total Weight Loss (lbs): 5 lb (2.268 kg)   Body Composition  Body Fat %: 52.2 % Fat Mass (lbs): 134.6 lbs Muscle Mass (lbs): 117.2 lbs Visceral Fat Rating : 17   Other Clinical Data Fasting: No Labs: Yes Today's Visit #: 29 Starting Date: 02/24/18    Chief Complaint:   OBESITY Tonya Nicholson is here to discuss her progress with her obesity treatment plan.  She is on the the Category 2 Plan and states she is following her eating plan approximately 50 % of the time.  She states she is exercising Gym Exercise 30 minutes 3 times per week.  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 Prediabetes Lab Results  Component Value Date   HGBA1C 5.8 (H) 01/13/2023   HGBA1C 5.1 02/05/2022   HGBA1C 5.5 07/15/2021    She has been consistently taking Metformin  500mg  in mornings She denies GI upset  She has Wegovy  supple at home: 5-6 weeks of Wegovy  1.7mg  5-6 weeks of Wegovy  2.4mg   2. Essential hypertension BP excellent at OV She denies CP with exertion She denies tobacco/vape use She is followed by [facility]- lat OV 04/2023  03/04/2018 ECHO COMPLETE FINDINGS   Left Ventricle: The left ventricle has normal systolic function of  60-65%.   Assessment/Plan:   1. Prediabetes (Primary) Refill metFORMIN  (GLUCOPHAGE ) 500 MG tablet  Take 1 tablet (500 mg total) by mouth daily with a meal. Dispense: 30 tablet, Refills: 0 of 0 remaining   2. Essential hypertension Check Labs at next OV Continue  amLODipine -benazepril  (LOTREL ) 5-40 MG capsule  metoprolol  tartrate (LOPRESSOR ) 25 MG tablet  apixaban  (ELIQUIS ) 5 MG TABS tablet  flecainide  (TAMBOCOR ) 100 MG tablet   3. Current BMI 40.4  Tonya Nicholson is currently in the action stage of change. As such, her goal is to continue with weight loss efforts. She has agreed to the Category 2 Plan.   Exercise goals: All adults should avoid inactivity. Some physical activity is better than none, and adults who participate in any amount of physical activity gain some health benefits. Adults should also include muscle-strengthening activities that involve all major muscle groups on 2 or more days a week.  Behavioral modification strategies: increasing lean protein intake, decreasing simple carbohydrates, increasing vegetables, increasing water intake, no skipping meals, meal planning and cooking strategies, keeping healthy foods in the home, ways to avoid boredom eating, and planning for success.  Tonya Nicholson has agreed to follow-up with our clinic in 4 weeks. She was informed of the importance of frequent follow-up visits to maximize her success with intensive lifestyle modifications for her multiple health conditions.   Check Fasting Labs at next OV  Objective:   Blood pressure 115/70, pulse 64, temperature 98.2 F (36.8 C), height 5' 7 (1.702 m), weight 258 lb (117 kg), last menstrual period 04/09/2016,  SpO2 97%. Body mass index is 40.41 kg/m.  General: Cooperative, alert, well developed, in no acute distress. HEENT: Conjunctivae and lids unremarkable. Cardiovascular: Regular rhythm.  Lungs: Normal work of breathing. Neurologic: No focal deficits.   Lab Results  Component Value Date   CREATININE 0.94 01/13/2023   BUN 16 01/13/2023   NA 140 01/13/2023   K 4.6 01/13/2023   CL 105  01/13/2023   CO2 23 01/13/2023   Lab Results  Component Value Date   ALT 17 01/13/2023   AST 17 01/13/2023   ALKPHOS 87 01/13/2023   BILITOT 0.4 01/13/2023   Lab Results  Component Value Date   HGBA1C 5.8 (H) 01/13/2023   HGBA1C 5.1 02/05/2022   HGBA1C 5.5 07/15/2021   HGBA1C 5.2 06/18/2021   HGBA1C 5.3 04/10/2021   Lab Results  Component Value Date   INSULIN  9.5 01/13/2023   INSULIN  4.9 02/05/2022   INSULIN  12.8 04/10/2021   INSULIN  11.0 09/26/2020   INSULIN  9.4 04/25/2020   Lab Results  Component Value Date   TSH 1.410 02/24/2018   Lab Results  Component Value Date   CHOL 161 01/13/2023   HDL 58 01/13/2023   LDLCALC 90 01/13/2023   TRIG 69 01/13/2023   CHOLHDL 2.8 01/13/2023   Lab Results  Component Value Date   VD25OH 34.6 01/13/2023   VD25OH 48.6 02/05/2022   VD25OH 77.7 07/15/2021   Lab Results  Component Value Date   WBC 5.6 07/11/2021   HGB 13.8 07/11/2021   HCT 41.0 07/11/2021   MCV 90 07/11/2021   PLT 284 07/11/2021   No results found for: IRON, TIBC, FERRITIN  Attestation Statements:   Reviewed by clinician on day of visit: allergies, medications, problem list, medical history, surgical history, family history, social history, and previous encounter notes.  I have reviewed the above documentation for accuracy and completeness, and I agree with the above. -  Tonya Nicholson d. Tonya Papaleo, NP-C

## 2023-09-15 ENCOUNTER — Other Ambulatory Visit (HOSPITAL_COMMUNITY): Payer: Self-pay

## 2023-09-16 ENCOUNTER — Other Ambulatory Visit (HOSPITAL_COMMUNITY): Payer: Self-pay

## 2023-10-01 ENCOUNTER — Other Ambulatory Visit: Payer: Self-pay | Admitting: Surgery

## 2023-10-01 DIAGNOSIS — M7989 Other specified soft tissue disorders: Secondary | ICD-10-CM

## 2023-10-04 ENCOUNTER — Other Ambulatory Visit (HOSPITAL_COMMUNITY): Payer: Self-pay

## 2023-10-04 ENCOUNTER — Other Ambulatory Visit: Payer: Self-pay | Admitting: Cardiology

## 2023-10-04 DIAGNOSIS — I10 Essential (primary) hypertension: Secondary | ICD-10-CM

## 2023-10-04 MED ORDER — METOPROLOL TARTRATE 25 MG PO TABS
25.0000 mg | ORAL_TABLET | Freq: Two times a day (BID) | ORAL | 3 refills | Status: AC
  Filled 2023-10-04: qty 180, 90d supply, fill #0
  Filled 2023-11-16 – 2024-01-02 (×2): qty 180, 90d supply, fill #1
  Filled 2024-02-28: qty 60, 30d supply, fill #2
  Filled ????-??-??: fill #1

## 2023-10-04 NOTE — Telephone Encounter (Signed)
 Ok to refill.  Refill BID. Rx sent in

## 2023-10-18 ENCOUNTER — Other Ambulatory Visit (HOSPITAL_COMMUNITY): Payer: Self-pay

## 2023-10-18 ENCOUNTER — Ambulatory Visit (INDEPENDENT_AMBULATORY_CARE_PROVIDER_SITE_OTHER): Admitting: Adult Health

## 2023-10-18 ENCOUNTER — Encounter (INDEPENDENT_AMBULATORY_CARE_PROVIDER_SITE_OTHER): Payer: Self-pay | Admitting: Adult Health

## 2023-10-18 VITALS — BP 115/70 | HR 55 | Ht 67.0 in | Wt 256.0 lb

## 2023-10-18 DIAGNOSIS — Z6841 Body Mass Index (BMI) 40.0 and over, adult: Secondary | ICD-10-CM

## 2023-10-18 DIAGNOSIS — E669 Obesity, unspecified: Secondary | ICD-10-CM

## 2023-10-18 DIAGNOSIS — R7303 Prediabetes: Secondary | ICD-10-CM

## 2023-10-18 DIAGNOSIS — Z0000 Encounter for general adult medical examination without abnormal findings: Secondary | ICD-10-CM

## 2023-10-18 DIAGNOSIS — I480 Paroxysmal atrial fibrillation: Secondary | ICD-10-CM | POA: Diagnosis not present

## 2023-10-18 DIAGNOSIS — E559 Vitamin D deficiency, unspecified: Secondary | ICD-10-CM

## 2023-10-18 MED ORDER — METFORMIN HCL 500 MG PO TABS: 500.0000 mg | ORAL_TABLET | Freq: Every day | ORAL | 0 refills | Status: DC

## 2023-10-18 NOTE — Progress Notes (Signed)
 WEIGHT SUMMARY AND BIOMETRICS  Vitals BP: 115/70 Pulse Rate: (!) 55 SpO2: 98 %   Anthropometric Measurements Height: 5' 7 (1.702 m) Weight: 256 lb (116.1 kg) BMI (Calculated): 40.09 Weight at Last Visit: 258lb Weight Lost Since Last Visit: 2lb Weight Gained Since Last Visit: 0lb Starting Weight: 263lb Total Weight Loss (lbs): 7 lb (3.175 kg)   Body Composition  Body Fat %: 38.6 % Fat Mass (lbs): 99.2 lbs Muscle Mass (lbs): 149.6 lbs Total Body Water (lbs): 107 lbs Visceral Fat Rating : 13   Other Clinical Data Fasting: Yes Labs: Yes Today's Visit #: 33 Starting Date: 02/24/18    Chief Complaint:   OBESITY Tonya Nicholson is here to discuss her progress with her obesity treatment plan.  She is on the the Category 2 Plan and states she is following her eating plan approximately 85 % of the time.  She states she is exercising Walking 30 minutes 2 times per week.  Interim History:  [name] just returned from weeks vacation at [facility]. She walked daily and enjoyed seafood.  Bioempedence scales were reflecting a dramatic shift in muscle and adipose masses- well off her normal trends. She was weighed once on each of four scales and most reliable reading was entered.  She states I feel like I have lost weight.  She restarted Wegovy  1mg  on 10/02/2023 She experienced a HA for 24 hrs  She administered her second 1mg  dose this morning- denies current GI upset  She has the following home Wegovy  Supply: Wegovy  1mg : 2 doses Wegovy  1.7mg : 4 doses Wegovy  2.4mg  : 7 doses  She plans on administrating GLP-1 therapy Q10D  Subjective:   1. Prediabetes Lab Results  Component Value Date   HGBA1C 5.8 (H) 01/13/2023   HGBA1C 5.1 02/05/2022   HGBA1C 5.5 07/15/2021    She restarted Wegovy  1mg  on 10/02/2023 She experienced a HA for 24 hrs  She administered her second 1mg  dose this morning- denies current GI upset  She has the following home Wegovy  Supply: Wegovy   1mg : 2 doses Wegovy  1.7mg : 4 doses Wegovy  2.4mg  : 7 doses  She plans on administrating GLP-1 therapy Q10D  She is also on daily Metformin  500mg - will experience GI upset if she takes on empty stomach  2. Vitamin D  deficiency  Latest Reference Range & Units 07/15/21 16:37 02/05/22 11:12 01/13/23 07:51  Vitamin D , 25-Hydroxy 30.0 - 100.0 ng/mL 77.7 48.6 34.6   She endorses stable energy levels  3. PAF (paroxysmal atrial fibrillation) (HCC) She denies acute cardiac sx's She denies CP when walking briskly  4. Healthcare maintenance She endorses stable energy levels BP excellent at OV She is followed by PCP, Cardiology, and Gastroenterology  Assessment/Plan:   1. Prediabetes (Primary) Check Labs - Hemoglobin A1c - Insulin , random Refill- TAKE WITH A FULL MEAL  metFORMIN  (GLUCOPHAGE ) 500 MG tablet Take 1 tablet (500 mg total) by mouth daily with a meal. Dispense: 30 tablet, Refills: 0 of 0 remaining   2. Vitamin D  deficiency Check Labs - VITAMIN D  25 Hydroxy (Vit-D Deficiency, Fractures)  3. PAF (paroxysmal atrial fibrillation) (HCC)  5. Healthcare maintenance Check Labs - Comprehensive metabolic panel with GFR - Vitamin B12 - Lipid panel  4. Current BMI 40.4  Dean is currently in the action stage of change. As such, her goal is to continue with weight loss efforts. She has agreed to the Category 2 Plan.   Exercise goals: All adults should avoid inactivity. Some physical activity is better  than none, and adults who participate in any amount of physical activity gain some health benefits. Adults should also include muscle-strengthening activities that involve all major muscle groups on 2 or more days a week.  Behavioral modification strategies: increasing lean protein intake, decreasing simple carbohydrates, increasing vegetables, increasing water intake, meal planning and cooking strategies, keeping healthy foods in the home, ways to avoid boredom eating, and planning  for success.  [name] has agreed to follow-up with our clinic in 4 weeks. She was informed of the importance of frequent follow-up visits to maximize her success with intensive lifestyle modifications for her multiple health conditions.   [name] was informed we would discuss her lab results at her next visit unless there is a critical issue that needs to be addressed sooner. [name] agreed to keep her next visit at the agreed upon time to discuss these results.  Objective:   Blood pressure 115/70, pulse (!) 55, height 5' 7 (1.702 m), weight 256 lb (116.1 kg), last menstrual period 04/09/2016, SpO2 98%. Body mass index is 40.1 kg/m.  General: Cooperative, alert, well developed, in no acute distress. HEENT: Conjunctivae and lids unremarkable. Cardiovascular: Regular rhythm.  Lungs: Normal work of breathing. Neurologic: No focal deficits.   Lab Results  Component Value Date   CREATININE 0.94 01/13/2023   BUN 16 01/13/2023   NA 140 01/13/2023   K 4.6 01/13/2023   CL 105 01/13/2023   CO2 23 01/13/2023   Lab Results  Component Value Date   ALT 17 01/13/2023   AST 17 01/13/2023   ALKPHOS 87 01/13/2023   BILITOT 0.4 01/13/2023   Lab Results  Component Value Date   HGBA1C 5.8 (H) 01/13/2023   HGBA1C 5.1 02/05/2022   HGBA1C 5.5 07/15/2021   HGBA1C 5.2 06/18/2021   HGBA1C 5.3 04/10/2021   Lab Results  Component Value Date   INSULIN  9.5 01/13/2023   INSULIN  4.9 02/05/2022   INSULIN  12.8 04/10/2021   INSULIN  11.0 09/26/2020   INSULIN  9.4 04/25/2020   Lab Results  Component Value Date   TSH 1.410 02/24/2018   Lab Results  Component Value Date   CHOL 161 01/13/2023   HDL 58 01/13/2023   LDLCALC 90 01/13/2023   TRIG 69 01/13/2023   CHOLHDL 2.8 01/13/2023   Lab Results  Component Value Date   VD25OH 34.6 01/13/2023   VD25OH 48.6 02/05/2022   VD25OH 77.7 07/15/2021   Lab Results  Component Value Date   WBC 5.6 07/11/2021   HGB 13.8 07/11/2021   HCT 41.0 07/11/2021    MCV 90 07/11/2021   PLT 284 07/11/2021   No results found for: IRON, TIBC, FERRITIN  Attestation Statements:   Reviewed by clinician on day of visit: allergies, medications, problem list, medical history, surgical history, family history, social history, and previous encounter notes.  I have reviewed the above documentation for accuracy and completeness, and I agree with the above. -  Jaonna Word d. Isabelly Kobler, NP-C

## 2023-10-19 DIAGNOSIS — Z124 Encounter for screening for malignant neoplasm of cervix: Secondary | ICD-10-CM | POA: Diagnosis not present

## 2023-10-19 DIAGNOSIS — Z01419 Encounter for gynecological examination (general) (routine) without abnormal findings: Secondary | ICD-10-CM | POA: Diagnosis not present

## 2023-10-19 DIAGNOSIS — Z1231 Encounter for screening mammogram for malignant neoplasm of breast: Secondary | ICD-10-CM | POA: Diagnosis not present

## 2023-10-19 DIAGNOSIS — Z1151 Encounter for screening for human papillomavirus (HPV): Secondary | ICD-10-CM | POA: Diagnosis not present

## 2023-10-19 DIAGNOSIS — Z6841 Body Mass Index (BMI) 40.0 and over, adult: Secondary | ICD-10-CM | POA: Diagnosis not present

## 2023-10-20 LAB — COMPREHENSIVE METABOLIC PANEL WITH GFR
ALT: 15 IU/L (ref 0–32)
AST: 21 IU/L (ref 0–40)
Albumin: 4.1 g/dL (ref 3.9–4.9)
Alkaline Phosphatase: 89 IU/L (ref 49–135)
BUN/Creatinine Ratio: 17 (ref 12–28)
BUN: 14 mg/dL (ref 8–27)
Bilirubin Total: 0.5 mg/dL (ref 0.0–1.2)
CO2: 22 mmol/L (ref 20–29)
Calcium: 9.2 mg/dL (ref 8.7–10.3)
Chloride: 103 mmol/L (ref 96–106)
Creatinine, Ser: 0.83 mg/dL (ref 0.57–1.00)
Globulin, Total: 2.5 g/dL (ref 1.5–4.5)
Glucose: 99 mg/dL (ref 70–99)
Potassium: 4.5 mmol/L (ref 3.5–5.2)
Sodium: 139 mmol/L (ref 134–144)
Total Protein: 6.6 g/dL (ref 6.0–8.5)
eGFR: 79 mL/min/1.73 (ref >59)

## 2023-10-20 LAB — HEMOGLOBIN A1C
Est. average glucose Bld gHb Est-mCnc: 117 mg/dL
Hgb A1c MFr Bld: 5.7 % — ABNORMAL HIGH (ref 4.8–5.6)

## 2023-10-20 LAB — LIPID PANEL
Chol/HDL Ratio: 3.6 ratio (ref 0.0–4.4)
Cholesterol, Total: 220 mg/dL — ABNORMAL HIGH (ref 100–199)
HDL: 61 mg/dL (ref >39)
LDL Chol Calc (NIH): 143 mg/dL — ABNORMAL HIGH (ref 0–99)
Triglycerides: 90 mg/dL (ref 0–149)
VLDL Cholesterol Cal: 16 mg/dL (ref 5–40)

## 2023-10-20 LAB — INSULIN, RANDOM: INSULIN: 8.2 u[IU]/mL (ref 2.6–24.9)

## 2023-10-20 LAB — VITAMIN B12: Vitamin B-12: 284 pg/mL (ref 232–1245)

## 2023-10-20 LAB — VITAMIN D 25 HYDROXY (VIT D DEFICIENCY, FRACTURES): Vit D, 25-Hydroxy: 48.2 ng/mL (ref 30.0–100.0)

## 2023-10-21 ENCOUNTER — Other Ambulatory Visit: Payer: Self-pay | Admitting: Obstetrics & Gynecology

## 2023-10-21 ENCOUNTER — Ambulatory Visit (INDEPENDENT_AMBULATORY_CARE_PROVIDER_SITE_OTHER): Admitting: Adult Health

## 2023-10-21 DIAGNOSIS — R928 Other abnormal and inconclusive findings on diagnostic imaging of breast: Secondary | ICD-10-CM

## 2023-10-26 ENCOUNTER — Ambulatory Visit
Admission: RE | Admit: 2023-10-26 | Discharge: 2023-10-26 | Disposition: A | Discharge status: Home / Self Care | Source: Ambulatory Visit | Attending: Obstetrics & Gynecology | Admitting: Obstetrics & Gynecology

## 2023-10-26 ENCOUNTER — Other Ambulatory Visit: Payer: Self-pay | Admitting: Obstetrics & Gynecology

## 2023-10-26 DIAGNOSIS — N6310 Unspecified lump in the right breast, unspecified quadrant: Secondary | ICD-10-CM

## 2023-10-26 DIAGNOSIS — R928 Other abnormal and inconclusive findings on diagnostic imaging of breast: Secondary | ICD-10-CM

## 2023-10-26 DIAGNOSIS — N6312 Unspecified lump in the right breast, upper inner quadrant: Secondary | ICD-10-CM | POA: Diagnosis not present

## 2023-10-27 ENCOUNTER — Ambulatory Visit
Admission: RE | Admit: 2023-10-27 | Discharge: 2023-10-27 | Disposition: A | Discharge status: Home / Self Care | Source: Ambulatory Visit | Attending: Obstetrics & Gynecology | Admitting: Obstetrics & Gynecology

## 2023-10-27 ENCOUNTER — Other Ambulatory Visit: Payer: Self-pay | Admitting: Obstetrics & Gynecology

## 2023-10-27 DIAGNOSIS — R928 Other abnormal and inconclusive findings on diagnostic imaging of breast: Secondary | ICD-10-CM

## 2023-10-27 DIAGNOSIS — N6312 Unspecified lump in the right breast, upper inner quadrant: Secondary | ICD-10-CM | POA: Diagnosis not present

## 2023-10-27 DIAGNOSIS — N6310 Unspecified lump in the right breast, unspecified quadrant: Secondary | ICD-10-CM

## 2023-10-27 HISTORY — PX: BREAST BIOPSY: SHX20

## 2023-10-28 ENCOUNTER — Ambulatory Visit
Admission: RE | Admit: 2023-10-28 | Discharge: 2023-10-28 | Disposition: A | Source: Ambulatory Visit | Attending: Obstetrics & Gynecology | Admitting: Obstetrics & Gynecology

## 2023-10-28 ENCOUNTER — Inpatient Hospital Stay
Admission: RE | Admit: 2023-10-28 | Discharge: 2023-10-28 | Attending: Obstetrics & Gynecology | Admitting: Obstetrics & Gynecology

## 2023-10-28 ENCOUNTER — Encounter

## 2023-10-28 ENCOUNTER — Other Ambulatory Visit

## 2023-10-28 DIAGNOSIS — N641 Fat necrosis of breast: Secondary | ICD-10-CM | POA: Diagnosis not present

## 2023-10-28 DIAGNOSIS — R928 Other abnormal and inconclusive findings on diagnostic imaging of breast: Secondary | ICD-10-CM

## 2023-10-28 DIAGNOSIS — N6312 Unspecified lump in the right breast, upper inner quadrant: Secondary | ICD-10-CM | POA: Diagnosis not present

## 2023-10-28 DIAGNOSIS — N6310 Unspecified lump in the right breast, unspecified quadrant: Secondary | ICD-10-CM

## 2023-10-28 HISTORY — PX: BREAST BIOPSY: SHX20

## 2023-10-28 LAB — SURGICAL PATHOLOGY

## 2023-10-29 LAB — SURGICAL PATHOLOGY

## 2023-11-01 ENCOUNTER — Ambulatory Visit (HOSPITAL_COMMUNITY)
Admission: RE | Admit: 2023-11-01 | Discharge: 2023-11-01 | Disposition: A | Discharge status: Home / Self Care | Source: Ambulatory Visit | Attending: Surgery | Admitting: Surgery

## 2023-11-01 ENCOUNTER — Encounter: Payer: Self-pay | Admitting: Surgery

## 2023-11-01 ENCOUNTER — Ambulatory Visit: Admitting: Surgery

## 2023-11-01 VITALS — BP 129/80 | HR 48 | Temp 97.9°F | Ht 67.0 in | Wt 262.0 lb

## 2023-11-01 DIAGNOSIS — M7989 Other specified soft tissue disorders: Secondary | ICD-10-CM | POA: Diagnosis not present

## 2023-11-01 DIAGNOSIS — I83891 Varicose veins of right lower extremities with other complications: Secondary | ICD-10-CM

## 2023-11-01 NOTE — Progress Notes (Signed)
 [facility]  Patient name: Tonya Nicholson MRN: [id number] DOB: Jun 27, 1960 Sex: female     REASON FOR CONSULT:    Leg swelling  HISTORY OF PRESENT ILLNESS:   Tonya Nicholson is a 63 y.o. female, who is here today for evaluation of bilateral leg swelling which has been going on for a prolonged period of time.  She has tried elevation and compression with some benefit.  She does complain of some discoloration in her lower legs.  She denies any history of DVT.  She has not had any prior vein procedures  PAST MEDICAL HISTORY    Past Medical History:  Diagnosis Date   Atrial fibrillation (HCC)    Dyspnea    GERD (gastroesophageal reflux disease)    Hypertension    Joint pain    Knee pain    Lower extremity edema    Sleep apnea    uses cpap     FAMILY HISTORY   Family History  Problem Relation Age of Onset   Diabetes Mother    Hypertension Mother    Atrial fibrillation Mother    Diabetes Father    Hypertension Father    Heart disease Father        multiple PCI & CABGx3   Bladder Cancer Father    Healthy Sister    Diabetes Brother    Heart attack Brother        died in 11/20/16   Colon polyps Neg Hx    Stomach cancer Neg Hx    Esophageal cancer Neg Hx     SOCIAL HISTORY:   Social History   Socioeconomic History   Marital status: Divorced    Spouse name: Not on file   Number of children: Not on file   Years of education: Not on file   Highest education level: Not on file  Occupational History   Occupation: Media planner  Tobacco Use   Smoking status: Never   Smokeless tobacco: Never  Vaping Use   Vaping status: Never Used  Substance and Sexual Activity   Alcohol use: Not Currently    Comment: 2 drinks per week   Drug use: No   Sexual activity: Not Currently    Birth control/protection: Post-menopausal  Other Topics Concern   Not on file  Social History Narrative   Not on file    Social Drivers of Health   Financial Resource Strain: Not on file  Food Insecurity: No Food Insecurity (06/20/2021)   Hunger Vital Sign    Worried About Running Out of Food in the Last Year: Never true    Ran Out of Food in the Last Year: Never true  Transportation Needs: No Transportation Needs (06/20/2021)   PRAPARE - Administrator, Civil Service (Medical): No    Lack of Transportation (Non-Medical): No  Physical Activity: Not on file  Stress: Not on file  Social Connections: Not on file  Intimate Partner Violence: Not on file    ALLERGIES:    No Known Allergies  CURRENT MEDICATIONS:    Current Outpatient Medications  Medication Sig Dispense Refill   amLODipine -benazepril  (LOTREL ) 5-40 MG capsule Take 1 capsule by mouth daily. 90 capsule 3   amLODipine -benazepril  (LOTREL ) 5-40 MG capsule Take 1 capsule by mouth daily. 90 capsule 3   apixaban  (ELIQUIS ) 5 MG TABS tablet Take 1 tablet (5 mg total) by mouth 2 (two) times daily. 180 tablet 1   flecainide  (TAMBOCOR ) 100 MG tablet  Take 1 tablet (100 mg total) by mouth 2 (two) times daily. 180 tablet 3   metFORMIN  (GLUCOPHAGE ) 500 MG tablet Take 1 tablet (500 mg total) by mouth daily with a meal. 30 tablet 0   metoprolol  tartrate (LOPRESSOR ) 25 MG tablet Take 1 tablet (25 mg total) by mouth 2 (two) times daily. 180 tablet 3   Vitamin D , Ergocalciferol , (DRISDOL ) 1.25 MG (50000 UNIT) CAPS capsule Take 1 capsule (50,000 Units total) by mouth every 7 (seven) days. 8 capsule 0   benzonatate  (TESSALON ) 100 MG capsule Take 1 capsule (100 mg total) by mouth 2 (two) times daily as needed for cough. 20 capsule 0   predniSONE  (DELTASONE ) 20 MG tablet Take 3 tablets (60 mg total) by mouth daily for 5 days. 15 tablet 0   No current facility-administered medications for this visit.    REVIEW OF SYSTEMS:   [X]  denotes positive finding, [ ]  denotes negative finding Cardiac  Comments:  Chest pain or chest pressure:    Shortness  of breath upon exertion:    Short of breath when lying flat:    Irregular heart rhythm:        Vascular    Pain in calf, thigh, or hip brought on by ambulation:    Pain in feet at night that wakes you up from your sleep:     Blood clot in your veins:    Leg swelling:  x       Pulmonary    Oxygen at home:    Productive cough:     Wheezing:         Neurologic    Sudden weakness in arms or legs:     Sudden numbness in arms or legs:     Sudden onset of difficulty speaking or slurred speech:    Temporary loss of vision in one eye:     Problems with dizziness:         Gastrointestinal    Blood in stool:      Vomited blood:         Genitourinary    Burning when urinating:     Blood in urine:        Psychiatric    Major depression:         Hematologic    Bleeding problems:    Problems with blood clotting too easily:        Skin    Rashes or ulcers:        Constitutional    Fever or chills:     PHYSICAL EXAM:   Vitals:   11/01/23 1123  BP: 129/80  Pulse: (!) 48  Temp: 97.9 F (36.6 C)  SpO2: 98%  Weight: 262 lb (118.8 kg)  Height: 5' 7 (1.702 m)    GENERAL: The patient is a well-nourished female, in no acute distress. The vital signs are documented above. CARDIAC: There is a regular rate and rhythm.  VASCULAR: SonoSite was used to evaluate the right great saphenous vein which is markedly dilated.  There are brawny discoloration of the ankles bilaterally.  She has palpable pedal pulses PULMONARY: Nonlabored respirations MUSCULOSKELETAL: There are no major deformities or cyanosis. NEUROLOGIC: No focal weakness or paresthesias are detected. SKIN: There are no ulcers or rashes noted. PSYCHIATRIC: The patient has a normal affect.  STUDIES:   I have reviewed the following: RIGHT         Reflux NoRefluxReflux TimeDiameter cmsComments  Yes                                         +--------------+---------+------+-----------+------------+-------------+  CFV                    yes                                        +--------------+---------+------+-----------+------------+-------------+  Popliteal              yes   >1 second                            +--------------+---------+------+-----------+------------+-------------+  GSV at [facility]              yes    >500 ms      0.8                    +--------------+---------+------+-----------+------------+-------------+  GSV prox thigh          yes    >500 ms      0.7                    +--------------+---------+------+-----------+------------+-------------+  GSV mid thigh           yes    >500 ms      0.65    out of fascia  +--------------+---------+------+-----------+------------+-------------+  GSV dist thigh          yes    >500 ms      0.6                    +--------------+---------+------+-----------+------------+-------------+  GSV at knee             yes    >500 ms      0.5                    +--------------+---------+------+-----------+------------+-------------+  GSV prox calf           yes    >500 ms      0.53                   +--------------+---------+------+-----------+------------+-------------+  SSV at [facility]    no                            0.69                   +--------------+---------+------+-----------+------------+-------------+  SSV prox calf no                            0.31                   +--------------+---------+------+-----------+------------+-------------+  SSV mid calf            yes    >500 ms      0.35                   +--------------+---------+------+-----------+------------+-------------+    ASSESSMENT and PLAN   CEAP Class IV: The patient has significant deep and superficial venous reflux with significant swelling and skin discoloration.  I have  recommended leg elevation, 20-30 thigh-high compression  stockings, and exercise.  She will return in 3 months for follow-up visit to consider laser ablation   Malvina Serene CLORE, MD, FACS [facility] [phone] Pager [phone]

## 2023-11-16 ENCOUNTER — Other Ambulatory Visit (HOSPITAL_COMMUNITY): Payer: Self-pay

## 2023-11-16 ENCOUNTER — Encounter (INDEPENDENT_AMBULATORY_CARE_PROVIDER_SITE_OTHER): Payer: Self-pay | Admitting: Adult Health

## 2023-11-16 ENCOUNTER — Ambulatory Visit (INDEPENDENT_AMBULATORY_CARE_PROVIDER_SITE_OTHER): Admitting: Adult Health

## 2023-11-16 VITALS — BP 125/72 | Ht 67.0 in | Wt 255.0 lb

## 2023-11-16 DIAGNOSIS — R7303 Prediabetes: Secondary | ICD-10-CM | POA: Diagnosis not present

## 2023-11-16 DIAGNOSIS — E669 Obesity, unspecified: Secondary | ICD-10-CM | POA: Diagnosis not present

## 2023-11-16 DIAGNOSIS — Z0000 Encounter for general adult medical examination without abnormal findings: Secondary | ICD-10-CM

## 2023-11-16 DIAGNOSIS — Z6839 Body mass index (BMI) 39.0-39.9, adult: Secondary | ICD-10-CM

## 2023-11-16 DIAGNOSIS — E782 Mixed hyperlipidemia: Secondary | ICD-10-CM

## 2023-11-16 DIAGNOSIS — E559 Vitamin D deficiency, unspecified: Secondary | ICD-10-CM

## 2023-11-16 MED ORDER — VITAMIN D (ERGOCALCIFEROL) 1.25 MG (50000 UNIT) PO CAPS
50000.0000 [IU] | ORAL_CAPSULE | ORAL | 0 refills | Status: DC
  Filled 2023-11-16 – 2023-11-26 (×2): qty 8, 56d supply, fill #0
  Filled ????-??-?? (×2): fill #0

## 2023-11-16 MED ORDER — ROSUVASTATIN CALCIUM 5 MG PO TABS
5.0000 mg | ORAL_TABLET | Freq: Every day | ORAL | 0 refills | Status: DC
  Filled 2023-11-16 – 2023-11-26 (×2): qty 30, 30d supply, fill #0
  Filled ????-??-??: fill #0

## 2023-11-16 MED ORDER — METFORMIN HCL 500 MG PO TABS
500.0000 mg | ORAL_TABLET | Freq: Every day | ORAL | 0 refills | Status: DC
  Filled 2023-11-16 – 2023-11-26 (×2): qty 30, 30d supply, fill #0
  Filled ????-??-??: fill #0

## 2023-11-16 NOTE — Progress Notes (Signed)
 WEIGHT SUMMARY AND BIOMETRICS  Vitals BP: 125/72   Anthropometric Measurements Height: 5' 7 (1.702 m) Weight: 255 lb (115.7 kg) (Patient had on compression socks) BMI (Calculated): 39.93 Weight at Last Visit: 256 lb Weight Lost Since Last Visit: 1 lb Weight Gained Since Last Visit: 0 Starting Weight: 263 lb Total Weight Loss (lbs): 8 lb (3.629 kg)   No data recorded Other Clinical Data Fasting: No Labs: No Today's Visit #: 51 Starting Date: 02/24/18 Comments: No Strip Today    Chief Complaint:   OBESITY Tonya Nicholson is here to discuss her progress with her obesity treatment plan.  She is on the the Category 2 Plan and states she is following her eating plan approximately 50 % of the time.  She states she is exercising Gym/NEAT Activities 30/daily minutes 3-4/7 times per week.  Interim History:  She administered Wegovy  1mg  on 10/02/2023 and 10/18/2023  She has remained off and states I am not in a right state of mind to use GLP-1 therapy.  She has the following home Wegovy  Supply: Wegovy  1mg : 2 doses Wegovy  1.7mg : 4 doses Wegovy  2.4mg  : 7 doses  Unable to obtain Bioempedence results as she has on compression socks (very difficult to remove)  Subjective:   1. Healthcare maintenance Discussed Labs 10/18/2023 CMP: Electrolytes, Kidney Fx, Liver Enzymes- stable  Vitamin B12 232 - 1,245 pg/mL 284   B12 level < 500, she is not taking any oral B12 supplementation   2. Prediabetes Discussed Labs  Latest Reference Range & Units 10/18/23 12:41  Glucose 70 - 99 mg/dL 99  Hemoglobin J8R 4.8 - 5.6 % 5.7 (H)  Est. average glucose Bld gHb Est-mCnc mg/dL 882  INSULIN  2.6 - 24.9 uIU/mL 8.2  (H): Data is abnormally high  GFR > 60 CBG at goal A1c and Insulin  both improved, however both slightly above goal  She is on daily Metformin  500mg - denies GI upset  She administered Wegovy  1mg  on 10/02/2023 and 10/18/2023  She has remained off and states I am not in a right  state of mind to use GLP-1 therapy.  She has the following home Wegovy  Supply: Wegovy  1mg : 2 doses Wegovy  1.7mg : 4 doses Wegovy  2.4mg  : 7 doses  3. Vitamin D  deficiency Discussed Labs  Latest Reference Range & Units 10/18/23 12:41  Vitamin D , 25-Hydroxy 30.0 - 100.0 ng/mL 48.2       Vit D level stable and just below goal of 50-70 She is on weekly Ergocalciferol   4. Mixed hyperlipidemia Discussed Labs Lipid Panel     Component Value Date/Time   CHOL 220 (H) 10/18/2023 1241   TRIG 90 10/18/2023 1241   HDL 61 10/18/2023 1241   CHOLHDL 3.6 10/18/2023 1241   LDLCALC 143 (H) 10/18/2023 1241   LABVLDL 16 10/18/2023 1241   The 10-year ASCVD risk score (Arnett DK, et al., 2019) is: 5.8%   Values used to calculate the score:     Age: 63 years     Clincally relevant sex: Female     Is Non-Hispanic African American: No     Diabetic: No     Tobacco smoker: No     Systolic Blood Pressure: 125 mmHg     Is BP treated: Yes     HDL Cholesterol: 61 mg/dL     Total Cholesterol: 220 mg/dL   0/77/74 CMP: Liver Enzymes are normal  Tot and LCL both worsened and above goal ASCVD risk is above goal of 5% She has never been  on statin therapy  Pertinent Family Hx: Brother age 78 CABG, then passed from complications from surgery the same week Father had HLD with stents  She is agreeable to starting Crestor 5mg  twice weekly- will repeat labs in 6-8 weeks  Assessment/Plan:   1. Healthcare maintenance Start daily OTC MVI  2. Prediabetes (Primary) Refill metFORMIN  (GLUCOPHAGE ) 500 MG tablet Take 1 tablet (500 mg total) by mouth daily with a meal. Dispense: 30 tablet, Refills: 0 ordered   3. Vitamin D  deficiency Refill Vitamin D , Ergocalciferol , (DRISDOL ) 1.25 MG (50000 UNIT) CAPS capsule Take 1 capsule (50,000 Units total) by mouth every 7 (seven) days. Dispense: 8 capsule, Refills: 0 ordered  Start daily OTC MVI  4. Mixed hyperlipidemia Start   rosuvastatin (CRESTOR) 5 MG tablet  Take 1 tablet (5 mg total) by mouth daily. Dispense: 30 tablet, Refills: 0 of 0 remaining  Take once daily 2 x week (Tuesday and Thursday) Check CMP and Lipid panel 6-8 weeks  5. Current BMI 39.93  Tonya Nicholson is currently in the action stage of change. As such, her goal is to continue with weight loss efforts. She has agreed to the Category 2 Plan.   Exercise goals: All adults should avoid inactivity. Some physical activity is better than none, and adults who participate in any amount of physical activity gain some health benefits. For substantial health benefits, adults should do at least 150 minutes (2 hours and 30 minutes) a week of moderate-intensity, or 75 minutes (1 hour and 15 minutes) a week of vigorous-intensity aerobic physical activity, or an equivalent combination of moderate- and vigorous-intensity aerobic activity. Aerobic activity should be performed in episodes of at least 10 minutes, and preferably, it should be spread throughout the week. Adults should also include muscle-strengthening activities that involve all major muscle groups on 2 or more days a week.  Behavioral modification strategies: increasing lean protein intake, decreasing simple carbohydrates, increasing vegetables, increasing water intake, no skipping meals, meal planning and cooking strategies, keeping healthy foods in the home, ways to avoid boredom eating, and planning for success.  Tonya Nicholson has agreed to follow-up with our clinic in 4 weeks. She was informed of the importance of frequent follow-up visits to maximize her success with intensive lifestyle modifications for her multiple health conditions.   Objective:   Blood pressure 125/72, height 5' 7 (1.702 m), weight 255 lb (115.7 kg), last menstrual period 04/09/2016. Body mass index is 39.94 kg/m.  General: Cooperative, alert, well developed, in no acute distress. HEENT: Conjunctivae and lids unremarkable. Cardiovascular: Regular rhythm.  Lungs: Normal work of  breathing. Neurologic: No focal deficits.   Lab Results  Component Value Date   CREATININE 0.83 10/18/2023   BUN 14 10/18/2023   NA 139 10/18/2023   K 4.5 10/18/2023   CL 103 10/18/2023   CO2 22 10/18/2023   Lab Results  Component Value Date   ALT 15 10/18/2023   AST 21 10/18/2023   ALKPHOS 89 10/18/2023   BILITOT 0.5 10/18/2023   Lab Results  Component Value Date   HGBA1C 5.7 (H) 10/18/2023   HGBA1C 5.8 (H) 01/13/2023   HGBA1C 5.1 02/05/2022   HGBA1C 5.5 07/15/2021   HGBA1C 5.2 06/18/2021   Lab Results  Component Value Date   INSULIN  8.2 10/18/2023   INSULIN  9.5 01/13/2023   INSULIN  4.9 02/05/2022   INSULIN  12.8 04/10/2021   INSULIN  11.0 09/26/2020   Lab Results  Component Value Date   TSH 1.410 02/24/2018   Lab Results  Component  Value Date   CHOL 220 (H) 10/18/2023   HDL 61 10/18/2023   LDLCALC 143 (H) 10/18/2023   TRIG 90 10/18/2023   CHOLHDL 3.6 10/18/2023   Lab Results  Component Value Date   VD25OH 48.2 10/18/2023   VD25OH 34.6 01/13/2023   VD25OH 48.6 02/05/2022   Lab Results  Component Value Date   WBC 5.6 07/11/2021   HGB 13.8 07/11/2021   HCT 41.0 07/11/2021   MCV 90 07/11/2021   PLT 284 07/11/2021   No results found for: IRON, TIBC, FERRITIN  Attestation Statements:   Reviewed by clinician on day of visit: allergies, medications, problem list, medical history, surgical history, family history, social history, and previous encounter notes.  I have reviewed the above documentation for accuracy and completeness, and I agree with the above. -  Merrill Deanda d. Samyrah Bruster, NP-C

## 2023-11-18 ENCOUNTER — Ambulatory Visit (INDEPENDENT_AMBULATORY_CARE_PROVIDER_SITE_OTHER): Admitting: Adult Health

## 2023-11-26 ENCOUNTER — Other Ambulatory Visit (HOSPITAL_COMMUNITY): Payer: Self-pay

## 2023-11-29 ENCOUNTER — Other Ambulatory Visit (HOSPITAL_COMMUNITY): Payer: Self-pay

## 2023-12-14 ENCOUNTER — Other Ambulatory Visit (HOSPITAL_COMMUNITY): Payer: Self-pay

## 2023-12-16 ENCOUNTER — Encounter (INDEPENDENT_AMBULATORY_CARE_PROVIDER_SITE_OTHER): Payer: Self-pay | Admitting: Adult Health

## 2023-12-16 ENCOUNTER — Ambulatory Visit (INDEPENDENT_AMBULATORY_CARE_PROVIDER_SITE_OTHER): Payer: Self-pay | Admitting: Adult Health

## 2023-12-16 VITALS — BP 115/69 | HR 55 | Ht 67.0 in | Wt 252.0 lb

## 2023-12-16 DIAGNOSIS — R7303 Prediabetes: Secondary | ICD-10-CM | POA: Diagnosis not present

## 2023-12-16 DIAGNOSIS — E782 Mixed hyperlipidemia: Secondary | ICD-10-CM | POA: Diagnosis not present

## 2023-12-16 DIAGNOSIS — Z6841 Body Mass Index (BMI) 40.0 and over, adult: Secondary | ICD-10-CM

## 2023-12-16 DIAGNOSIS — E669 Obesity, unspecified: Secondary | ICD-10-CM | POA: Diagnosis not present

## 2023-12-16 DIAGNOSIS — E559 Vitamin D deficiency, unspecified: Secondary | ICD-10-CM | POA: Diagnosis not present

## 2023-12-16 DIAGNOSIS — I480 Paroxysmal atrial fibrillation: Secondary | ICD-10-CM

## 2023-12-16 NOTE — Progress Notes (Signed)
 WEIGHT SUMMARY AND BIOMETRICS  No data recorded Anthropometric Measurements Height: 5' 7 (1.702 m) Weight at Last Visit: 255lb Starting Weight: 263lb   No data recorded Other Clinical Data Fasting: No Labs: no Today's Visit #: 38 Starting Date: 02/24/18    Chief Complaint:   OBESITY Tonya Nicholson is here to discuss her progress with her obesity treatment plan.  She is on the the Category 2 Plan and states she is following her eating plan approximately 75 % of the time.  She states she is exercising Walking 30 minutes 7 times per week.  Interim History:  She administered Wegovy  1mg  on 10/02/2023 and 10/18/2023  She tolerated well She would like to restart GLP-1 therapy on a weekly basis.  She has the following home Wegovy  Supply: Wegovy  1mg : 2 doses Wegovy  1.7mg : 4 doses Wegovy  2.4mg  : 7 doses  Of Note- Her daughter will undergo bariatric surgery Dec 2025  Subjective:   1. Mixed hyperlipidemia 11/16/2023 Started on twice weekly Crestor 5mg  She denies myalgias  2. Prediabetes Lab Results  Component Value Date   HGBA1C 5.7 (H) 10/18/2023   HGBA1C 5.8 (H) 01/13/2023   HGBA1C 5.1 02/05/2022    She administered Wegovy  1mg  on 10/02/2023 and 10/18/2023  She tolerated well She would like to restart GLP-1 therapy on a weekly basis.  She has the following home Wegovy  Supply: Wegovy  1mg : 2 doses Wegovy  1.7mg : 4 doses Wegovy  2.4mg  : 7 doses  Of note- She denies family hx of MENS 2 or MTC She denies personal hx of pancreatitis She is post menopausal  3. Vitamin D  deficiency  Latest Reference Range & Units 02/05/22 11:12 01/13/23 07:51 10/18/23 12:41  Vitamin D , 25-Hydroxy 30.0 - 100.0 ng/mL 48.6 34.6 48.2   She is on weekly Ergocalciferol - denies N/V/Muscle Weakness  4. PAF (paroxysmal atrial fibrillation) (HCC) 05/07/2023 Cardiology OV Notes: History of Present Illness:   Tonya Nicholson is a 63 y.o. female with h/o AF,  HTN, OSA seen today for routine  electrophysiology followup.    Since last being seen in our clinic the patient reports she has been doing well. No issues on flecainide  / toprol .  One nose bleed that was short lived but no other bleeding issues on Eliquis .    She denies chest pain, palpitations, dyspnea, PND, orthopnea, nausea, vomiting, dizziness, syncope, edema, weight gain, or early satiety.    Review of systems complete and found to be negative unless listed in HPI.   Assessment/Plan:   1. Mixed hyperlipidemia (Primary) Check CMP at next OV Continue daily walking  2. Prediabetes Restart Wegovy  1mg   Monitor for SE Continue daily walking  3. Vitamin D  deficiency Continue weekly Ergocalciferol - denies need for refill today  4. PAF (paroxysmal atrial fibrillation) (HCC) 05/07/2023 Cardiology OV Notes: ASSESSMENT AND PLAN:     Persistent Atrial Fibrillation  CHA2DS2-VASc 2  -EKG with NSR, stable intervals   -continue flecainide  100 mg daily  -metoprolol  25 mg daily    Secondary Hypercoagulable State  -continue Eliquis , dose reviewed and appropriate by age / wt    Hypertension  -well controlled on current regimen     Trace LE Edema  -compression stockings recommended   Follow up with Dr. Inocencio in 6 months  5. Current BMI 40.09 Restart Wegovy  1mg   Monitor for SE  Stuart is currently in the action stage of change. As such, her goal is to continue with weight loss efforts. She has agreed to the Category 2 Plan.  Exercise goals: All adults should avoid inactivity. Some physical activity is better than none, and adults who participate in any amount of physical activity gain some health benefits. Adults should also include muscle-strengthening activities that involve all major muscle groups on 2 or more days a week. Continue daily walking  Behavioral modification strategies: increasing lean protein intake, decreasing simple carbohydrates, increasing vegetables, increasing water intake, no skipping meals,  meal planning and cooking strategies, keeping healthy foods in the home, ways to avoid boredom eating, and planning for success.  [name] has agreed to follow-up with our clinic in 4 weeks. She was informed of the importance of frequent follow-up visits to maximize her success with intensive lifestyle modifications for her multiple health conditions.   Check CMP at next OV  Objective:   Height 5' 7 (1.702 m), last menstrual period 04/09/2016. Body mass index is 39.94 kg/m.  General: Cooperative, alert, well developed, in no acute distress. HEENT: Conjunctivae and lids unremarkable. Cardiovascular: Regular rhythm.  Lungs: Normal work of breathing. Neurologic: No focal deficits.   Lab Results  Component Value Date   CREATININE 0.83 10/18/2023   BUN 14 10/18/2023   NA 139 10/18/2023   K 4.5 10/18/2023   CL 103 10/18/2023   CO2 22 10/18/2023   Lab Results  Component Value Date   ALT 15 10/18/2023   AST 21 10/18/2023   ALKPHOS 89 10/18/2023   BILITOT 0.5 10/18/2023   Lab Results  Component Value Date   HGBA1C 5.7 (H) 10/18/2023   HGBA1C 5.8 (H) 01/13/2023   HGBA1C 5.1 02/05/2022   HGBA1C 5.5 07/15/2021   HGBA1C 5.2 06/18/2021   Lab Results  Component Value Date   INSULIN  8.2 10/18/2023   INSULIN  9.5 01/13/2023   INSULIN  4.9 02/05/2022   INSULIN  12.8 04/10/2021   INSULIN  11.0 09/26/2020   Lab Results  Component Value Date   TSH 1.410 02/24/2018   Lab Results  Component Value Date   CHOL 220 (H) 10/18/2023   HDL 61 10/18/2023   LDLCALC 143 (H) 10/18/2023   TRIG 90 10/18/2023   CHOLHDL 3.6 10/18/2023   Lab Results  Component Value Date   VD25OH 48.2 10/18/2023   VD25OH 34.6 01/13/2023   VD25OH 48.6 02/05/2022   Lab Results  Component Value Date   WBC 5.6 07/11/2021   HGB 13.8 07/11/2021   HCT 41.0 07/11/2021   MCV 90 07/11/2021   PLT 284 07/11/2021   No results found for: IRON, TIBC, FERRITIN  Attestation Statements:   Reviewed by clinician  on day of visit: allergies, medications, problem list, medical history, surgical history, family history, social history, and previous encounter notes.  I have reviewed the above documentation for accuracy and completeness, and I agree with the above. -  Savaughn Karwowski d. Boysie Bonebrake, NP-C

## 2023-12-22 ENCOUNTER — Other Ambulatory Visit (HOSPITAL_COMMUNITY): Payer: Self-pay

## 2024-01-02 ENCOUNTER — Other Ambulatory Visit (INDEPENDENT_AMBULATORY_CARE_PROVIDER_SITE_OTHER): Payer: Self-pay | Admitting: Adult Health

## 2024-01-02 DIAGNOSIS — R7303 Prediabetes: Secondary | ICD-10-CM

## 2024-01-03 ENCOUNTER — Other Ambulatory Visit: Payer: Self-pay

## 2024-01-03 ENCOUNTER — Encounter (HOSPITAL_COMMUNITY): Payer: Self-pay

## 2024-01-03 ENCOUNTER — Other Ambulatory Visit (HOSPITAL_COMMUNITY): Payer: Self-pay

## 2024-01-10 ENCOUNTER — Other Ambulatory Visit: Payer: Self-pay | Admitting: Pulmonary Disease

## 2024-01-10 DIAGNOSIS — I4819 Other persistent atrial fibrillation: Secondary | ICD-10-CM

## 2024-01-13 ENCOUNTER — Ambulatory Visit (INDEPENDENT_AMBULATORY_CARE_PROVIDER_SITE_OTHER): Payer: Self-pay | Admitting: Adult Health

## 2024-01-13 ENCOUNTER — Other Ambulatory Visit (HOSPITAL_COMMUNITY): Payer: Self-pay

## 2024-01-13 VITALS — BP 99/66 | HR 61 | Ht 67.0 in | Wt 246.0 lb

## 2024-01-13 DIAGNOSIS — E559 Vitamin D deficiency, unspecified: Secondary | ICD-10-CM

## 2024-01-13 DIAGNOSIS — Z6838 Body mass index (BMI) 38.0-38.9, adult: Secondary | ICD-10-CM | POA: Diagnosis not present

## 2024-01-13 DIAGNOSIS — R7303 Prediabetes: Secondary | ICD-10-CM

## 2024-01-13 DIAGNOSIS — E669 Obesity, unspecified: Secondary | ICD-10-CM | POA: Diagnosis not present

## 2024-01-13 DIAGNOSIS — E782 Mixed hyperlipidemia: Secondary | ICD-10-CM | POA: Diagnosis not present

## 2024-01-13 DIAGNOSIS — I10 Essential (primary) hypertension: Secondary | ICD-10-CM | POA: Diagnosis not present

## 2024-01-13 MED ORDER — METFORMIN HCL 500 MG PO TABS
500.0000 mg | ORAL_TABLET | Freq: Every day | ORAL | 0 refills | Status: DC
  Filled 2024-01-13: qty 30, 30d supply, fill #0

## 2024-01-13 MED ORDER — VITAMIN D (ERGOCALCIFEROL) 1.25 MG (50000 UNIT) PO CAPS
50000.0000 [IU] | ORAL_CAPSULE | ORAL | 0 refills | Status: DC
  Filled 2024-01-13: qty 8, 56d supply, fill #0

## 2024-01-13 NOTE — Progress Notes (Signed)
 WEIGHT SUMMARY AND BIOMETRICS  Vitals BP: 99/66 Pulse Rate: 61 SpO2: 99 %   Anthropometric Measurements Height: 5' 7 (1.702 m) Weight: 246 lb (111.6 kg) BMI (Calculated): 38.52 Weight at Last Visit: 252lb Weight Lost Since Last Visit: 6lb Weight Gained Since Last Visit: 0lb Starting Weight: 263lb Total Weight Loss (lbs): 17 lb (7.711 kg)   Body Composition  Body Fat %: 50.4 % Fat Mass (lbs): 124 lbs Muscle Mass (lbs): 116 lbs Total Body Water (lbs): 93 lbs Visceral Fat Rating : 16   Other Clinical Data Fasting: Yes Labs: No Today's Visit #: 61 Starting Date: 02/24/18    Chief Complaint:   OBESITY Tonya Nicholson is here to discuss her progress with her obesity treatment plan.  She is on the the Category 2 Plan and states she is following her eating plan approximately 70 % of the time.  She states she is exercising Walking 30 minutes 3 times per week.  Interim History:  Her daughter underwent successful bariatric sugery on 12/9/205 She is recovering well at home. The family has adapted meal planning and prepping during her daughter's recovery. Tonya Nicholson reports consuming less calories and grams of protein per day, to be supportive of her daughter.  Reviewed Bioimpedance Results: Muscle Mass: -2.4 lbs Adipose Mass: -4 lbs  Muscle loss, or atrophy, can be attributed to malnutrition  Reviewed daily caloric/protein goals  Subjective:   1. Prediabetes Lab Results  Component Value Date   HGBA1C 5.7 (H) 10/18/2023   HGBA1C 5.8 (H) 01/13/2023   HGBA1C 5.1 02/05/2022    She is on Metformin  500mg  daily and Wegovy  1mg  every 10 days Denies mass in neck, dysphagia, dyspepsia, persistent hoarseness, abdominal pain, or N/V/C   2. Mixed hyperlipidemia 11/16/2023 she was started on daily Crestor  5mg  She denies myalgias  3. Vitamin D  deficiency  Latest Reference Range & Units 02/05/22 11:12 01/13/23 07:51 10/18/23 12:41  Vitamin D , 25-Hydroxy 30.0 - 100.0 ng/mL  48.6 34.6 48.2   She is on weekly Ergocalciferol - denies N/V/Muscle Weakness  4. Essential hypertension BP soft at OV She feels that she has been drinking less water the last several weeks She denies sx's of hypotension  Assessment/Plan:   1. Prediabetes (Primary) Refill - metFORMIN  (GLUCOPHAGE ) 500 MG tablet; Take 1 tablet (500 mg total) by mouth daily with a meal.  Dispense: 30 tablet; Refill: 0 Increase calories and grams of protein per day  2. Mixed hyperlipidemia Check Labs - Comprehensive metabolic panel with GFR  3. Vitamin D  deficiency Refill - Vitamin D , Ergocalciferol , (DRISDOL ) 1.25 MG (50000 UNIT) CAPS capsule; Take 1 capsule (50,000 Units total) by mouth every 7 (seven) days.  Dispense: 8 capsule; Refill: 0  4. Essential hypertension Increase calories and grams of protein per day Increase water intake, strive for at least half body weight in oz per day  5. Current BMI 38.8 Increase calories and grams of protein per day Increase water intake, strive for at least half body weight in oz per day  Tonya Nicholson is currently in the action stage of change. As such, her goal is to continue with weight loss efforts. She has agreed to the Category 2 Plan.   Exercise goals: All adults should avoid inactivity. Some physical activity is better than none, and adults who participate in any amount of physical activity gain some health benefits. Adults should also include muscle-strengthening activities that involve all major muscle groups on 2 or more days a week. Increase daily walking  Behavioral  modification strategies: increasing lean protein intake, decreasing simple carbohydrates, increasing vegetables, increasing water intake, no skipping meals, meal planning and cooking strategies, keeping healthy foods in the home, travel eating strategies, holiday eating strategies , celebration eating strategies, and planning for success.  Tonya Nicholson has agreed to follow-up with our clinic in 4  weeks. She was informed of the importance of frequent follow-up visits to maximize her success with intensive lifestyle modifications for her multiple health conditions.   Objective:   Blood pressure 99/66, pulse 61, height 5' 7 (1.702 m), weight 246 lb (111.6 kg), last menstrual period 04/09/2016, SpO2 99%. Body mass index is 38.53 kg/m.  General: Cooperative, alert, well developed, in no acute distress. HEENT: Conjunctivae and lids unremarkable. Cardiovascular: Regular rhythm.  Lungs: Normal work of breathing. Neurologic: No focal deficits.   Lab Results  Component Value Date   CREATININE 0.83 10/18/2023   BUN 14 10/18/2023   NA 139 10/18/2023   K 4.5 10/18/2023   CL 103 10/18/2023   CO2 22 10/18/2023   Lab Results  Component Value Date   ALT 15 10/18/2023   AST 21 10/18/2023   ALKPHOS 89 10/18/2023   BILITOT 0.5 10/18/2023   Lab Results  Component Value Date   HGBA1C 5.7 (H) 10/18/2023   HGBA1C 5.8 (H) 01/13/2023   HGBA1C 5.1 02/05/2022   HGBA1C 5.5 07/15/2021   HGBA1C 5.2 06/18/2021   Lab Results  Component Value Date   INSULIN  8.2 10/18/2023   INSULIN  9.5 01/13/2023   INSULIN  4.9 02/05/2022   INSULIN  12.8 04/10/2021   INSULIN  11.0 09/26/2020   Lab Results  Component Value Date   TSH 1.410 02/24/2018   Lab Results  Component Value Date   CHOL 220 (H) 10/18/2023   HDL 61 10/18/2023   LDLCALC 143 (H) 10/18/2023   TRIG 90 10/18/2023   CHOLHDL 3.6 10/18/2023   Lab Results  Component Value Date   VD25OH 48.2 10/18/2023   VD25OH 34.6 01/13/2023   VD25OH 48.6 02/05/2022   Lab Results  Component Value Date   WBC 5.6 07/11/2021   HGB 13.8 07/11/2021   HCT 41.0 07/11/2021   MCV 90 07/11/2021   PLT 284 07/11/2021   No results found for: IRON, TIBC, FERRITIN  Attestation Statements:   Reviewed by clinician on day of visit: allergies, medications, problem list, medical history, surgical history, family history, social history, and previous  encounter notes.  I have reviewed the above documentation for accuracy and completeness, and I agree with the above. - Serena Petterson d. Zadkiel Dragan, NP-C

## 2024-01-14 LAB — COMPREHENSIVE METABOLIC PANEL WITH GFR
ALT: 19 IU/L (ref 0–32)
AST: 17 IU/L (ref 0–40)
Albumin: 3.9 g/dL (ref 3.9–4.9)
Alkaline Phosphatase: 85 IU/L (ref 49–135)
BUN/Creatinine Ratio: 19 (ref 12–28)
BUN: 16 mg/dL (ref 8–27)
Bilirubin Total: 0.4 mg/dL (ref 0.0–1.2)
CO2: 23 mmol/L (ref 20–29)
Calcium: 9.5 mg/dL (ref 8.7–10.3)
Chloride: 104 mmol/L (ref 96–106)
Creatinine, Ser: 0.85 mg/dL (ref 0.57–1.00)
Globulin, Total: 2.5 g/dL (ref 1.5–4.5)
Glucose: 90 mg/dL (ref 70–99)
Potassium: 4.3 mmol/L (ref 3.5–5.2)
Sodium: 141 mmol/L (ref 134–144)
Total Protein: 6.4 g/dL (ref 6.0–8.5)
eGFR: 77 mL/min/1.73

## 2024-01-15 ENCOUNTER — Other Ambulatory Visit (HOSPITAL_COMMUNITY): Payer: Self-pay

## 2024-01-15 ENCOUNTER — Other Ambulatory Visit: Payer: Self-pay | Admitting: Pulmonary Disease

## 2024-01-15 DIAGNOSIS — I4819 Other persistent atrial fibrillation: Secondary | ICD-10-CM

## 2024-01-18 ENCOUNTER — Other Ambulatory Visit (HOSPITAL_COMMUNITY): Payer: Self-pay

## 2024-02-15 ENCOUNTER — Other Ambulatory Visit (INDEPENDENT_AMBULATORY_CARE_PROVIDER_SITE_OTHER): Payer: Self-pay | Admitting: Adult Health

## 2024-02-15 ENCOUNTER — Other Ambulatory Visit: Payer: Self-pay | Admitting: Pulmonary Disease

## 2024-02-15 DIAGNOSIS — I4819 Other persistent atrial fibrillation: Secondary | ICD-10-CM

## 2024-02-16 ENCOUNTER — Other Ambulatory Visit (HOSPITAL_COMMUNITY): Payer: Self-pay

## 2024-02-16 ENCOUNTER — Other Ambulatory Visit (INDEPENDENT_AMBULATORY_CARE_PROVIDER_SITE_OTHER): Payer: Self-pay | Admitting: Adult Health

## 2024-02-16 ENCOUNTER — Other Ambulatory Visit: Payer: Self-pay

## 2024-02-16 DIAGNOSIS — R7303 Prediabetes: Secondary | ICD-10-CM

## 2024-02-16 DIAGNOSIS — E559 Vitamin D deficiency, unspecified: Secondary | ICD-10-CM

## 2024-02-16 MED ORDER — VITAMIN D (ERGOCALCIFEROL) 1.25 MG (50000 UNIT) PO CAPS
50000.0000 [IU] | ORAL_CAPSULE | ORAL | 0 refills | Status: AC
  Filled 2024-02-16: qty 12, 84d supply, fill #0

## 2024-02-16 MED ORDER — ROSUVASTATIN CALCIUM 5 MG PO TABS
5.0000 mg | ORAL_TABLET | Freq: Every day | ORAL | 0 refills | Status: AC
  Filled 2024-02-16 (×2): qty 90, 90d supply, fill #0

## 2024-02-16 MED ORDER — METFORMIN HCL 500 MG PO TABS
500.0000 mg | ORAL_TABLET | Freq: Every day | ORAL | 0 refills | Status: AC
  Filled 2024-02-16: qty 90, 90d supply, fill #0

## 2024-02-17 ENCOUNTER — Ambulatory Visit (INDEPENDENT_AMBULATORY_CARE_PROVIDER_SITE_OTHER): Admitting: Adult Health

## 2024-02-17 MED ORDER — APIXABAN 5 MG PO TABS
5.0000 mg | ORAL_TABLET | Freq: Two times a day (BID) | ORAL | 1 refills | Status: AC
  Filled 2024-02-17 – 2024-02-18 (×3): qty 180, 90d supply, fill #0

## 2024-02-18 ENCOUNTER — Other Ambulatory Visit (HOSPITAL_COMMUNITY): Payer: Self-pay

## 2024-02-18 ENCOUNTER — Other Ambulatory Visit: Payer: Self-pay

## 2024-02-19 ENCOUNTER — Other Ambulatory Visit (HOSPITAL_COMMUNITY): Payer: Self-pay

## 2024-02-24 ENCOUNTER — Other Ambulatory Visit: Payer: Self-pay

## 2024-02-28 ENCOUNTER — Other Ambulatory Visit: Payer: Self-pay | Admitting: *Deleted

## 2024-02-28 ENCOUNTER — Other Ambulatory Visit: Payer: Self-pay

## 2024-02-28 ENCOUNTER — Other Ambulatory Visit (HOSPITAL_COMMUNITY): Payer: Self-pay

## 2024-02-28 ENCOUNTER — Ambulatory Visit: Admitting: Surgery

## 2024-02-28 MED ORDER — AMLODIPINE BESY-BENAZEPRIL HCL 5-40 MG PO CAPS
1.0000 | ORAL_CAPSULE | Freq: Every day | ORAL | 3 refills | Status: AC
  Filled 2024-02-28: qty 90, 90d supply, fill #0

## 2024-02-29 ENCOUNTER — Other Ambulatory Visit (HOSPITAL_COMMUNITY): Payer: Self-pay

## 2024-02-29 MED ORDER — AMLODIPINE BESY-BENAZEPRIL HCL 5-40 MG PO CAPS
1.0000 | ORAL_CAPSULE | Freq: Every day | ORAL | 2 refills | Status: AC
  Filled 2024-02-29: qty 90, 90d supply, fill #0

## 2024-03-16 ENCOUNTER — Ambulatory Visit (INDEPENDENT_AMBULATORY_CARE_PROVIDER_SITE_OTHER): Admitting: Adult Health

## 2024-04-13 ENCOUNTER — Ambulatory Visit (INDEPENDENT_AMBULATORY_CARE_PROVIDER_SITE_OTHER): Admitting: Adult Health
